# Patient Record
Sex: Female | Born: 1950 | Race: White | Hispanic: Refuse to answer | Marital: Married | Smoking: Never smoker
Health system: Southern US, Community
[De-identification: ages and names within clinical notes are randomized; demographics above are authoritative.]

## PROBLEM LIST (undated history)

## (undated) DIAGNOSIS — D563 Thalassemia minor: Secondary | ICD-10-CM

## (undated) DIAGNOSIS — H269 Unspecified cataract: Secondary | ICD-10-CM

## (undated) DIAGNOSIS — M810 Age-related osteoporosis without current pathological fracture: Secondary | ICD-10-CM

## (undated) DIAGNOSIS — S92909A Unspecified fracture of unspecified foot, initial encounter for closed fracture: Secondary | ICD-10-CM

## (undated) HISTORY — DX: Age-related osteoporosis without current pathological fracture: M81.0

## (undated) HISTORY — DX: Unspecified cataract: H26.9

## (undated) HISTORY — PX: FRACTURE SURGERY: SHX138

## (undated) HISTORY — DX: Thalassemia minor: D56.3

## (undated) HISTORY — DX: Unspecified fracture of unspecified foot, initial encounter for closed fracture: S92.909A

---

## 1997-12-06 ENCOUNTER — Other Ambulatory Visit: Admission: RE | Admit: 1997-12-06 | Discharge: 1997-12-06 | Payer: Self-pay | Admitting: Obstetrics & Gynecology

## 1998-09-23 ENCOUNTER — Other Ambulatory Visit: Admission: RE | Admit: 1998-09-23 | Discharge: 1998-09-23 | Payer: Self-pay | Admitting: Obstetrics and Gynecology

## 1999-08-22 ENCOUNTER — Encounter: Payer: Self-pay | Admitting: Obstetrics and Gynecology

## 1999-08-22 ENCOUNTER — Encounter: Admission: RE | Admit: 1999-08-22 | Discharge: 1999-08-22 | Payer: Self-pay | Admitting: Obstetrics and Gynecology

## 1999-10-23 ENCOUNTER — Other Ambulatory Visit: Admission: RE | Admit: 1999-10-23 | Discharge: 1999-10-23 | Payer: Self-pay | Admitting: Obstetrics and Gynecology

## 2000-01-22 ENCOUNTER — Emergency Department (HOSPITAL_COMMUNITY): Admission: EM | Admit: 2000-01-22 | Discharge: 2000-01-22 | Payer: Self-pay | Admitting: Emergency Medicine

## 2000-08-24 ENCOUNTER — Encounter: Admission: RE | Admit: 2000-08-24 | Discharge: 2000-08-24 | Payer: Self-pay | Admitting: Obstetrics and Gynecology

## 2000-08-24 ENCOUNTER — Encounter: Payer: Self-pay | Admitting: Obstetrics and Gynecology

## 2000-12-28 ENCOUNTER — Other Ambulatory Visit: Admission: RE | Admit: 2000-12-28 | Discharge: 2000-12-28 | Payer: Self-pay | Admitting: Obstetrics and Gynecology

## 2001-10-12 ENCOUNTER — Encounter: Admission: RE | Admit: 2001-10-12 | Discharge: 2001-10-12 | Payer: Self-pay | Admitting: Obstetrics and Gynecology

## 2001-10-12 ENCOUNTER — Encounter: Payer: Self-pay | Admitting: Obstetrics and Gynecology

## 2002-02-27 ENCOUNTER — Other Ambulatory Visit: Admission: RE | Admit: 2002-02-27 | Discharge: 2002-02-27 | Payer: Self-pay | Admitting: Obstetrics and Gynecology

## 2002-10-16 ENCOUNTER — Encounter: Payer: Self-pay | Admitting: Obstetrics and Gynecology

## 2002-10-16 ENCOUNTER — Encounter: Admission: RE | Admit: 2002-10-16 | Discharge: 2002-10-16 | Payer: Self-pay | Admitting: Obstetrics and Gynecology

## 2003-06-23 HISTORY — PX: US ECHOCARDIOGRAPHY: HXRAD669

## 2003-07-02 ENCOUNTER — Other Ambulatory Visit: Admission: RE | Admit: 2003-07-02 | Discharge: 2003-07-02 | Payer: Self-pay | Admitting: Obstetrics and Gynecology

## 2003-10-15 ENCOUNTER — Encounter: Admission: RE | Admit: 2003-10-15 | Discharge: 2003-10-15 | Payer: Self-pay | Admitting: Obstetrics and Gynecology

## 2004-05-12 ENCOUNTER — Ambulatory Visit: Payer: Self-pay | Admitting: Internal Medicine

## 2004-05-27 ENCOUNTER — Ambulatory Visit: Payer: Self-pay | Admitting: Internal Medicine

## 2004-06-02 ENCOUNTER — Encounter: Payer: Self-pay | Admitting: Internal Medicine

## 2004-06-22 HISTORY — PX: COLONOSCOPY: SHX174

## 2004-06-22 LAB — HM COLONOSCOPY: HM Colonoscopy: NORMAL

## 2004-06-24 ENCOUNTER — Ambulatory Visit: Payer: Self-pay | Admitting: Internal Medicine

## 2004-09-23 ENCOUNTER — Ambulatory Visit: Payer: Self-pay | Admitting: Internal Medicine

## 2004-10-23 ENCOUNTER — Encounter: Admission: RE | Admit: 2004-10-23 | Discharge: 2004-10-23 | Payer: Self-pay | Admitting: Obstetrics and Gynecology

## 2004-12-05 ENCOUNTER — Ambulatory Visit: Payer: Self-pay | Admitting: Internal Medicine

## 2005-02-20 ENCOUNTER — Ambulatory Visit: Payer: Self-pay | Admitting: Internal Medicine

## 2005-11-25 ENCOUNTER — Encounter: Admission: RE | Admit: 2005-11-25 | Discharge: 2005-11-25 | Payer: Self-pay | Admitting: Obstetrics and Gynecology

## 2006-08-04 ENCOUNTER — Ambulatory Visit (HOSPITAL_BASED_OUTPATIENT_CLINIC_OR_DEPARTMENT_OTHER): Admission: RE | Admit: 2006-08-04 | Discharge: 2006-08-04 | Payer: Self-pay | Admitting: Obstetrics and Gynecology

## 2006-08-04 ENCOUNTER — Encounter (INDEPENDENT_AMBULATORY_CARE_PROVIDER_SITE_OTHER): Payer: Self-pay | Admitting: Specialist

## 2006-11-19 ENCOUNTER — Ambulatory Visit: Payer: Self-pay | Admitting: Internal Medicine

## 2006-12-01 ENCOUNTER — Encounter: Admission: RE | Admit: 2006-12-01 | Discharge: 2006-12-01 | Payer: Self-pay | Admitting: Obstetrics and Gynecology

## 2007-06-23 HISTORY — PX: OTHER SURGICAL HISTORY: SHX169

## 2007-09-09 ENCOUNTER — Encounter: Payer: Self-pay | Admitting: Internal Medicine

## 2007-09-19 LAB — CONVERTED CEMR LAB
ALT: 14 units/L
Albumin: 4.6 g/dL
BUN: 13 mg/dL
Calcium: 10.1 mg/dL
Cholesterol: 214 mg/dL
HCT: 40 %
LDL Cholesterol: 101 mg/dL
MCHC: 31.3 g/dL
Potassium: 3.3 meq/L
RBC: 6.78 M/uL
RDW: 15.7 %
Total Protein: 7.4 g/dL
Triglycerides: 79 mg/dL

## 2007-09-23 ENCOUNTER — Ambulatory Visit: Payer: Self-pay | Admitting: Internal Medicine

## 2007-09-23 DIAGNOSIS — D568 Other thalassemias: Secondary | ICD-10-CM | POA: Insufficient documentation

## 2007-09-23 DIAGNOSIS — E876 Hypokalemia: Secondary | ICD-10-CM | POA: Insufficient documentation

## 2007-09-23 DIAGNOSIS — R5383 Other fatigue: Secondary | ICD-10-CM

## 2007-09-23 DIAGNOSIS — R5381 Other malaise: Secondary | ICD-10-CM | POA: Insufficient documentation

## 2007-10-12 ENCOUNTER — Encounter: Payer: Self-pay | Admitting: Internal Medicine

## 2007-10-13 ENCOUNTER — Telehealth: Payer: Self-pay | Admitting: *Deleted

## 2008-02-06 ENCOUNTER — Encounter: Admission: RE | Admit: 2008-02-06 | Discharge: 2008-02-06 | Payer: Self-pay | Admitting: Obstetrics and Gynecology

## 2009-02-06 ENCOUNTER — Encounter: Admission: RE | Admit: 2009-02-06 | Discharge: 2009-02-06 | Payer: Self-pay | Admitting: Obstetrics and Gynecology

## 2009-11-11 ENCOUNTER — Telehealth: Payer: Self-pay | Admitting: *Deleted

## 2009-12-25 ENCOUNTER — Ambulatory Visit: Payer: Self-pay | Admitting: Internal Medicine

## 2009-12-25 LAB — CONVERTED CEMR LAB
Bilirubin Urine: NEGATIVE
CO2: 29 meq/L (ref 19–32)
Chloride: 108 meq/L (ref 96–112)
Cholesterol: 219 mg/dL — ABNORMAL HIGH (ref 0–200)
Creatinine, Ser: 0.7 mg/dL (ref 0.4–1.2)
Glucose, Bld: 87 mg/dL (ref 70–99)
HCT: 41.2 % (ref 36.0–46.0)
HDL: 84.4 mg/dL (ref >39.00)
Hemoglobin, Urine: NEGATIVE
Hemoglobin: 13.2 g/dL (ref 12.0–15.0)
Ketones, ur: NEGATIVE mg/dL
Lymphs Abs: 1.5 10*3/uL (ref 0.7–4.0)
MCHC: 32 g/dL (ref 30.0–36.0)
Neutro Abs: 3.4 10*3/uL (ref 1.4–7.7)
Neutrophils Relative %: 63.3 % (ref 43.0–77.0)
Nitrite: NEGATIVE
Platelets: 194 10*3/uL (ref 150.0–400.0)
Potassium: 4.6 meq/L (ref 3.5–5.1)
RBC: 6.33 M/uL — ABNORMAL HIGH (ref 3.87–5.11)
TSH: 1.29 microintl units/mL (ref 0.35–5.50)
Total CHOL/HDL Ratio: 3
Total Protein, Urine: NEGATIVE mg/dL
Triglycerides: 88 mg/dL (ref 0.0–149.0)
Urine Glucose: NEGATIVE mg/dL
Urobilinogen, UA: 0.2 (ref 0.0–1.0)
VLDL: 17.6 mg/dL (ref 0.0–40.0)
WBC: 5.4 10*3/uL (ref 4.5–10.5)
pH: 6 (ref 5.0–8.0)

## 2009-12-31 ENCOUNTER — Ambulatory Visit: Payer: Self-pay | Admitting: Internal Medicine

## 2009-12-31 DIAGNOSIS — R1319 Other dysphagia: Secondary | ICD-10-CM | POA: Insufficient documentation

## 2009-12-31 DIAGNOSIS — R0609 Other forms of dyspnea: Secondary | ICD-10-CM | POA: Insufficient documentation

## 2009-12-31 DIAGNOSIS — R0989 Other specified symptoms and signs involving the circulatory and respiratory systems: Secondary | ICD-10-CM

## 2010-01-21 ENCOUNTER — Encounter: Admission: RE | Admit: 2010-01-21 | Discharge: 2010-01-21 | Payer: Self-pay | Admitting: Internal Medicine

## 2010-02-07 ENCOUNTER — Encounter: Admission: RE | Admit: 2010-02-07 | Discharge: 2010-02-07 | Payer: Self-pay | Admitting: Obstetrics and Gynecology

## 2010-06-22 HISTORY — PX: COLONOSCOPY: SHX174

## 2010-07-24 NOTE — Progress Notes (Signed)
Summary: bat in house  Phone Note Call from Patient Call back at [facility] Phone [phone] Call back at [phone] (cell)   Caller: Patient Summary of Call: Pt had a Bat in her house. It was located between her window and the storm window. Animal control told them to call us to notify us. Pt is unsure of anyone getting bit. Animal control said that they could have gotten bit but not know it.  Initial call taken by: Romualdo Bolk, CMA Duncan Dull),  Nov 11, 2009 1:01 PM  Follow-up for Phone Call        It seems highly unlikely that anyone was bitten. As long as no one handled the bat, they should be fine  Follow-up by: Nelwyn Salisbury MD,  Nov 11, 2009 1:25 PM  Additional Follow-up for Phone Call Additional follow up Details #1::        Pt aware. Additional Follow-up by: Romualdo Bolk, CMA (AAMA),  Nov 11, 2009 1:38 PM

## 2010-07-24 NOTE — Assessment & Plan Note (Signed)
Summary: cpx-no pap//ccm   Vital Signs:  Patient profile:   60 year old female Menstrual status:  postmenopausal Height:      66.5 inches Weight:      148 pounds BMI:     23.62 Pulse rate:   84 / minute BP sitting:   120 / 80  (right arm) Cuff size:   regular  Vitals Entered By: Romualdo Bolk, CMA (AAMA) (December 31, 2009 9:25 AM) CC: CPX- no pap- Pt has a gyn who does paps     Menstrual Status postmenopausal Last PAP Result normal   History of Present Illness: Heather Goodwin  comes in today  for preventive visit . She is on no meds and  seens gyne for paps . She is generally well on no rx medications , Hasnt had a check up in a number of years.  Some concerns about some decrease in exer cise tolerance but exercises regualary without cp sob However has had some feeling of hard to get a deep breath when not exercising . Is not sure  its serious  or not. NO cough diaphoresis or weight change.   Also feel transiently that something is pressing on her strenal tracheal area at time but no true dysphagia no drooling and this is  episodic  Alos her mom died of pancreatic cancer and has ? about this ? if screening is a good idea.   Preventive Care Screening  Pap Smear:    Date:  12/20/2008    Results:  normal   Mammogram:    Date:  10/20/2008    Results:  normal   Last Tetanus Booster:    Date:  06/23/2003    Results:  Historical   Prior Values:    Colonoscopy:  Normal;  ? date  (06/22/2004)    Bone Density:  Normal (09/19/2007)    Dexa Interp:  Normal (09/19/2007)    T-score L-Spine:  -0.6 (09/19/2007)    T-score L hip:  -0.2 (09/19/2007)   Preventive Screening-Counseling & Management  Alcohol-Tobacco     Alcohol drinks/day: <1     Alcohol type: wine     Smoking Status: never  Caffeine-Diet-Exercise     Caffeine use/day: 2-3     Does Patient Exercise: yes  Hep-HIV-STD-Contraception     Dental Visit-last 6 months yes     Sun Exposure-Excessive:  no  Safety-Violence-Falls     Seat Belt Use: yes     Firearms in the Home: firearms in the home     Firearm Counseling: not indicated; uses recommended firearm safety measures     Smoke Detectors: yes      Blood Transfusions:  no.    Current Medications (verified): 1)  None  Allergies (verified): No Known Drug Allergies  Past History:  Past medical, surgical, family and social histories (including risk factors) reviewed, and no changes noted (except as noted below).  Past Medical History: b thal trait childbirth x3  foot fracture  at gym Dexa  nl  per gyne      Past Surgical History:  left knee surgery  meniscus  2009 Neg echo for syncope 2005  Past History:  Care Management: Gynecology: Rosalio Macadamia    Family History: Reviewed history from 09/23/2007 and no changes required. Family History Thyroid disease-Mom with partial thyroidectomy; six siblings Family History Liver disease- Father died of cirrhosis of the liver secondary to chronic hepatitis B infection. Family History Breast cancer 1st degree relative <50-Paternal Aunt had breast cancer. Mom died  of pancreatic cancer Son with B thal trait  Social History: Reviewed history from 09/23/2007 and no changes required. Married Never Smoked Alcohol use-yes Regular exercise-yes Caffeine use/day:  2-3 Dental Care w/in 6 mos.:  yes Sun Exposure-Excessive:  no Seat Belt Use:  yes Blood Transfusions:  no  Review of Systems  The patient denies anorexia, fever, vision loss, decreased hearing, hoarseness, syncope, peripheral edema, prolonged cough, headaches, hemoptysis, abdominal pain, melena, hematochezia, severe indigestion/heartburn, hematuria, muscle weakness, transient blindness, difficulty walking, depression, abnormal bleeding, enlarged lymph nodes, angioedema, and breast masses.   Physical Exam General Appearance: well developed, well nourished, no acute distress Eyes: conjunctiva and lids normal, PERRLA,  EOMI, fWNL Ears, Nose, Mouth, Throat: TM clear, nares clear, oral exam WNL Neck: supple, no lymphadenopathy, no thyromegaly, no JVD  describes area of discomfort at suprasternal notch  Respiratory: clear to auscultation and percussion, respiratory effort normal Cardiovascular: regular rate and rhythm, S1-S2, no murmur, rub or gallop, no bruits, peripheral pulses normal and symmetric, no cyanosis, clubbing, edema or varicosities Chest: no scars, masses, tenderness; no asymmetry, skin changes, nipple discharge   Gastrointestinal: soft, non-tender; no hepatosplenomegaly, masses; active bowel sounds all quadrants,  Genitourinary: per gyne Lymphatic: no cervical, axillary or inguinal adenopathy Musculoskeletal: gait normal, muscle tone and strength WNL, no joint swelling, effusions, discoloration, crepitus  Skin: clear, good turgor, color WNL, no rashes, lesions, or ulcerations Neurologic: normal mental status, normal reflexes, normal strength, sensation, and motion Psychiatric: alert; oriented to person, place and time Other Exam:  EKG NSR  70 nnl intervals   labs nl except wbc in urine without .signs of uti    and microcytosis( c/w beta thal )     Impression & Recommendations:  Problem # 1:  PREVENTIVE HEALTH CARE (ICD-V70.0)  continue healthy lifestyle   UTD   in parameters  Orders: EKG w/ Interpretation (93000)  Problem # 2:  DYSPNEA/SHORTNESS OF BREATH (ICD-786.09)  this seems not alarming but newer  . she is fit but has noted some change   noevicne of heart dysfunction . consider getting pulm evlauation first. or CVpulm assessment  Orders: Pulmonary Referral (Pulmonary) EKG w/ Interpretation (93000)  Problem # 3:  OTHER DYSPHAGIA (ICD-787.29)  near lower trachea     seems nl exam although could have  low lying thyroid     because of the other signs will check neck US  consider ent check  if persistent or  progressive    Orders: Radiology Referral (Radiology)  Problem # 4:   NEOPLASM, MALIGNANT, PANCREAS, FAMILY HX (ICD-V16.0) no signs of such  asks ? about advisability of any screening   .  disc  no standard  like ct mri markers etc.  rec unless  high risk   .  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  check about pancreatic cancer screening.

## 2010-11-07 NOTE — Op Note (Signed)
NAMEKENNICE, FINNIE              ACCOUNT NO.:  [id number]   MEDICAL RECORD NO.:  [id number]          PATIENT TYPE:  AMB   LOCATION:  [facility]                         FACILITY:  [facility]   PHYSICIAN:  Sherry A. Dickstein, M.D.DATE OF BIRTH:  22-Jan-1951   DATE OF PROCEDURE:  08/04/2006  DATE OF DISCHARGE:                               OPERATIVE REPORT   PREOPERATIVE DIAGNOSIS:  Menorrhagia, submucosal fibroids.   POSTOPERATIVE DIAGNOSIS:  Menorrhagia, submucosal fibroids.   OPERATION PERFORMED:  Dilation and curettage hysterectomy with attempted  endometrial ablation with resectoscope.   SURGEON:  Sherry A. Rosalio Macadamia, M.D.   ANESTHESIA:  MAC.   INDICATIONS FOR PROCEDURE:  This is a 60 year old woman who has had  increasing menorrhagia over the past two years.  The patient has been  treated with progesterone which has not controlled her bleeding.  Patient underwent an ultrasound which revealed a fibroid uterus.  She  then had sonohysterogram which revealed a submucosal fibroid.  However,  less than 50% of the fibroid was felt to be in the endometrial cavity.  Therefore, a decision was made to perform an endometrial ablation rather  than a D&C with a resectoscope.  The patient was then prepared for  endometrial ablation.   FINDINGS:  Normal-sized anteflexed uterus.  No adnexal mass.  Endometrial cavity with two submucosal fibroids present with significant  amount of the fibroid protruding into the endometrial cavity.   DESCRIPTION OF PROCEDURE:  The patient was brought into the operating  room and given adequate IV sedation.  She was placed in dorsal lithotomy  position.  Her perineum was washed with Betadine, vagina was washed with  Betadine, bladder was in and out catheterized.  The patient was draped  in sterile fashion.  Speculum was placed within the vagina.  A  paracervical block was administered with 1% nesacaine.  The cervix was  grasped with a single toothed tenaculum.   Cervix was dilated with Pratt  dilator to approximately #21.  The endometrial ablation HDA system was  set up and the hysteroscope was first attempted to be introduced into  the endometrial cavity; however, the cervix was too tight.  Therefore,  the cervix was redilated to approximately a 23.  Then the hysteroscope  was able to be introduced into the endometrial cavity.  Pictures were  obtained.  The system was then started.  A sponge was placed within the  vagina to be able to catch any leaking and to watch for any absorption  of water.  The system was started and initially there was no leaking.  Once the heat system had been started, during the initial heating  process, it was shown that there was some leaking of approximately 9 mL  per minute.  The system automatically shut down.  After a short wait  period, the heating process was restarted.  As the heating process was  restarted, it was shown that there was again some expansion of fluid.  It was felt that this was not safe to continue in case there was any  leaking.  There was no leaking seen within the vagina.  The sponge that  was in the vagina at this point was completely dry.  It was felt that  the uterus may be expanding from the heating process, but it also could  be that the fluid was going out the fallopian tubes.  If this were true  and the heat cycle went up to the full complete heat mode, this could be  dangerous to the bowel contents, therefore, the procedure was stopped.  Since the fibroids that were seen were felt to be significant enough in  the endometrial cavity, it was felt that a resectoscope could be  performed.   The hysteroscope from the HDA system was removed and the resectoscope  was prepared and then was introduced into the endometrial cavity after  dilating the cervix up to a #33.  Using a double loop right angle  resector, the two fibroids that were present were resected and were  attempted to be resected  just to the level of the remaining endometrial  surface.  This was resected in sheets and the sheets of tissue were  removed.  Once this was accomplished, pictures were obtained, no further  resections were obtained.  All instruments were removed from the vagina.  In the endometrial cavity, adequate hemostasis was present.  All  instruments were removed.  The patient was taken out of the dorsal  lithotomy position.  She was awakened.  She was moved from the operating  table to a stretcher in stable condition.   COMPLICATIONS:  None.   ESTIMATED BLOOD LOSS:  Less than 5 mL.   SORBITOL DIFFERENTIAL:  -100 mL.      Sherry A. Rosalio Macadamia, M.D.  Electronically Signed     SAD/MEDQ  D:  08/04/2006  T:  08/04/2006  Job:  [id number]

## 2010-11-07 NOTE — Op Note (Signed)
Heather Goodwin, Heather Goodwin              ACCOUNT NO.:  [id number]   MEDICAL RECORD NO.:  [id number]          PATIENT TYPE:  AMB   LOCATION:  [facility]                         [facility]ACILITY:  [facility]   PHYSICIAN:  Sherry A. Dickstein, M.D.DATE OF BIRTH:  03/22/1951   DATE OF PROCEDURE:  DATE OF DISCHARGE:                               OPERATIVE REPORT   Audio too short to transcribe (less than 5 seconds)      Sherry A. Rosalio Macadamia, M.D.     SAD/MEDQ  D:  08/04/2006  T:  08/04/2006  Job:  [id number]

## 2011-01-30 IMAGING — US US SOFT TISSUE HEAD/NECK
1 series · 14 of 25 positions shown · non-contrast
Comparison: None.

CLINICAL DATA: Dysphagia

THYROID ULTRASOUND
TECHNIQUE: Ultrasound examination of the thyroid gland and adjacent
soft tissues was performed.

[Series 1: us soft tissue head/neck · 0.06mm/px · 14 of 36 slices shown]
[im 1/36]
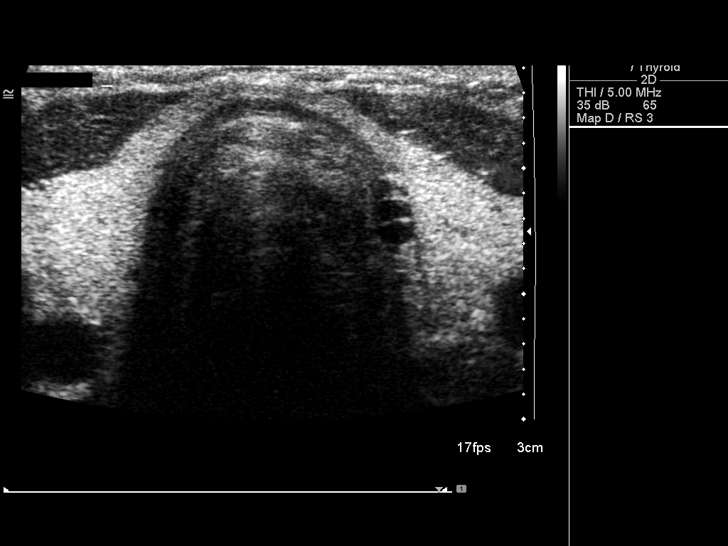
[im 3/36]
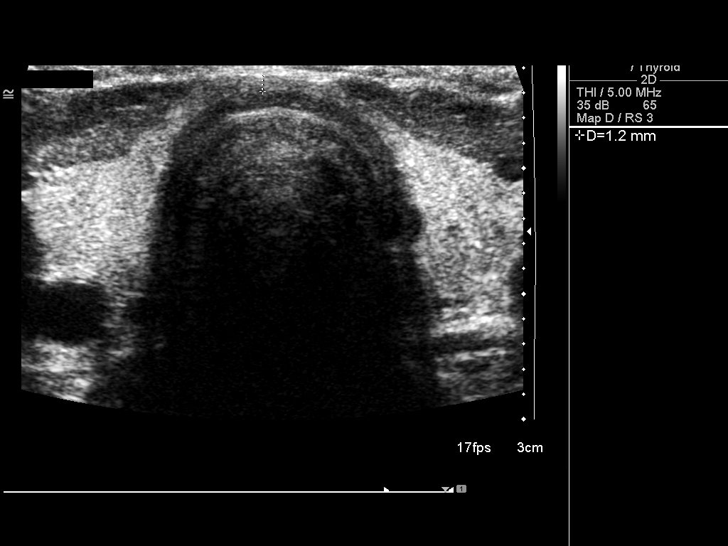
[im 6/36]
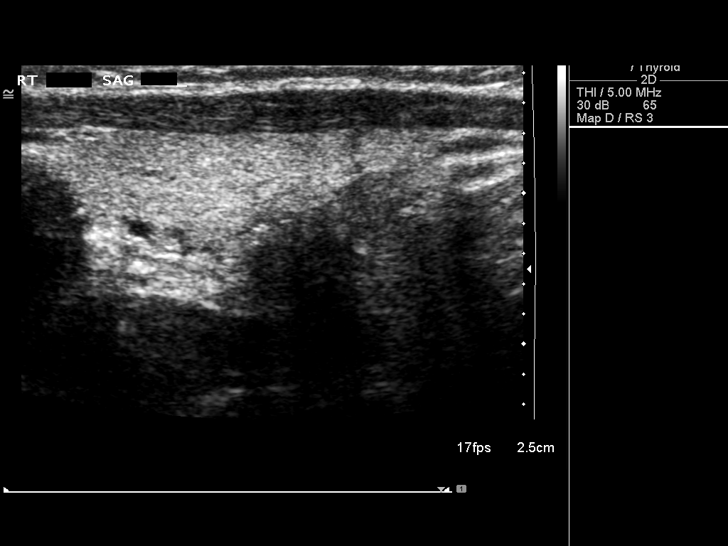
[im 9/36]
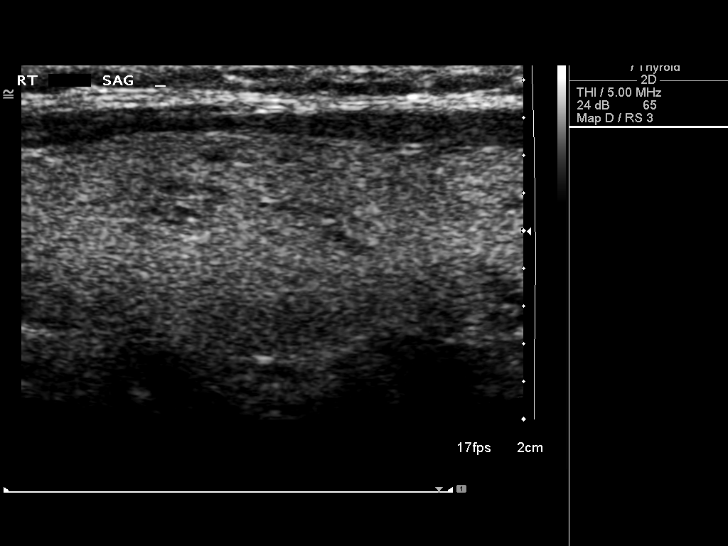
[im 12/36]
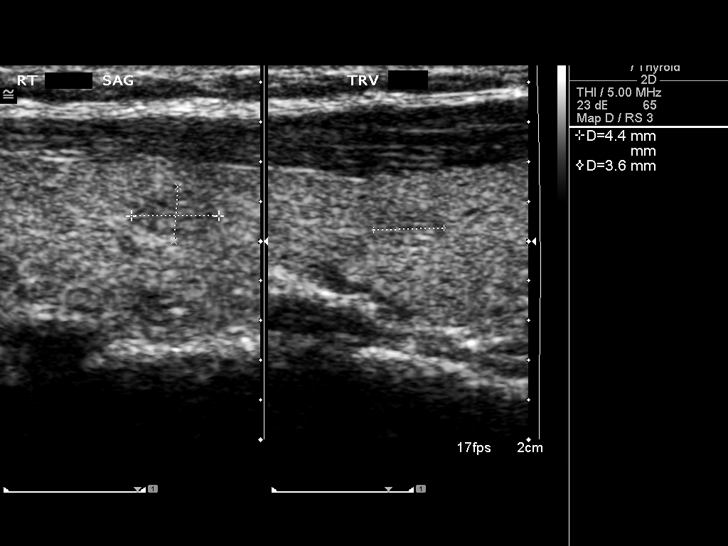
[im 14/36]
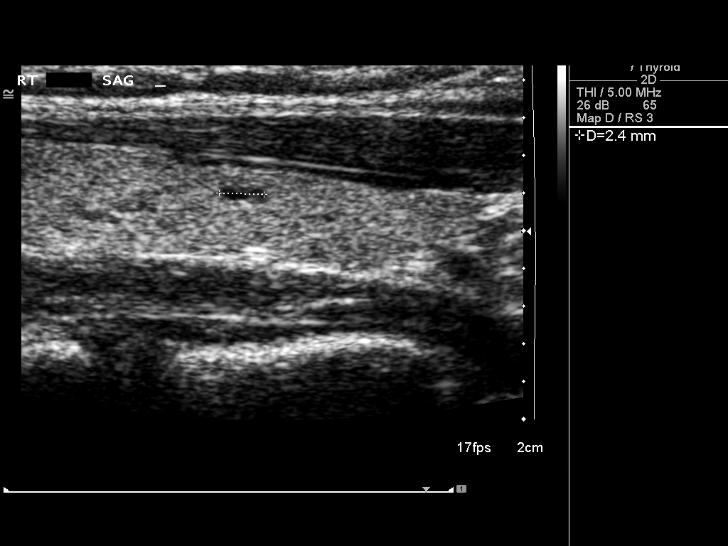
[im 17/36]
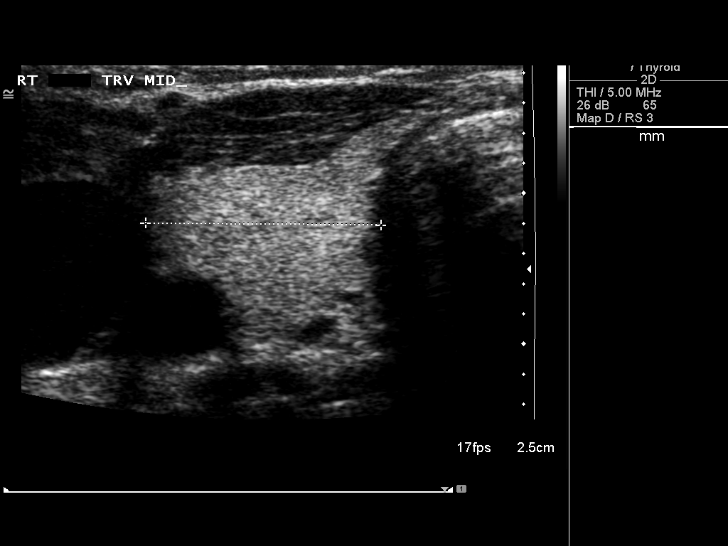
[im 19/36]
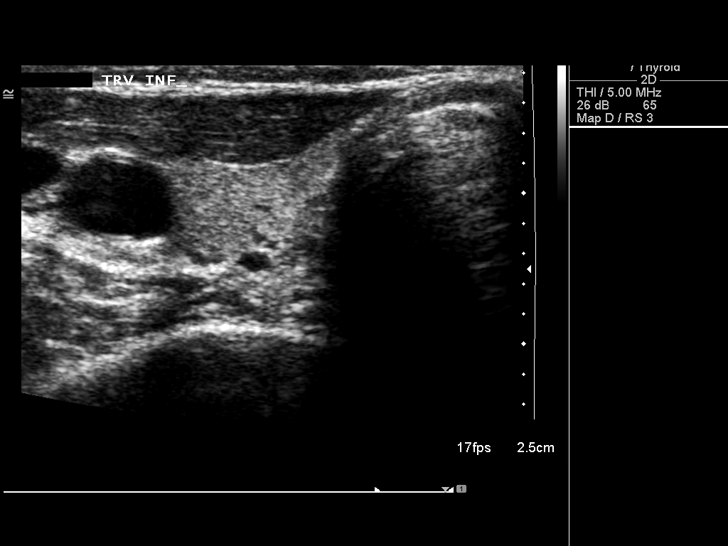
[im 22/36]
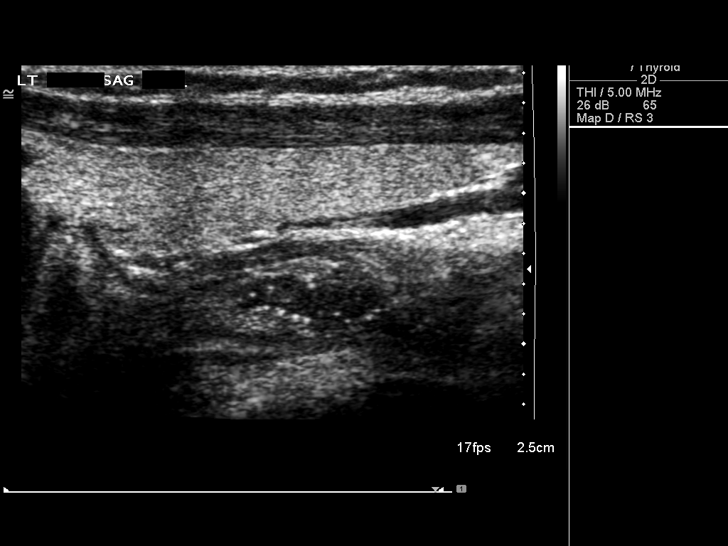
[im 24/36]
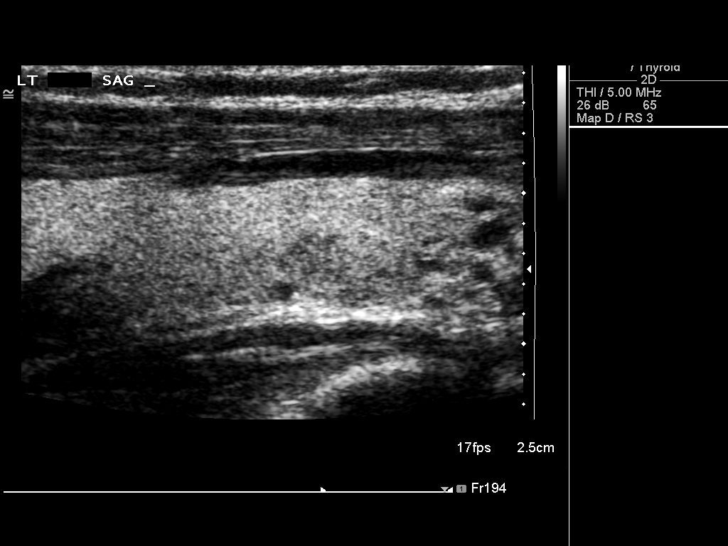
[im 27/36]
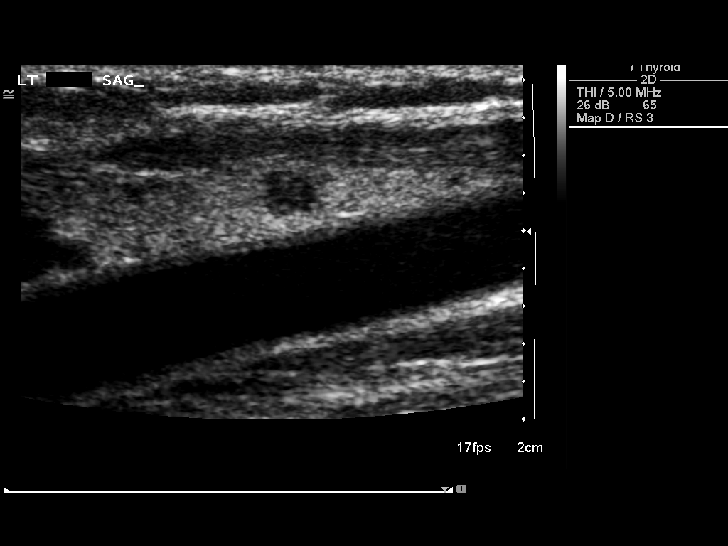
[im 30/36]
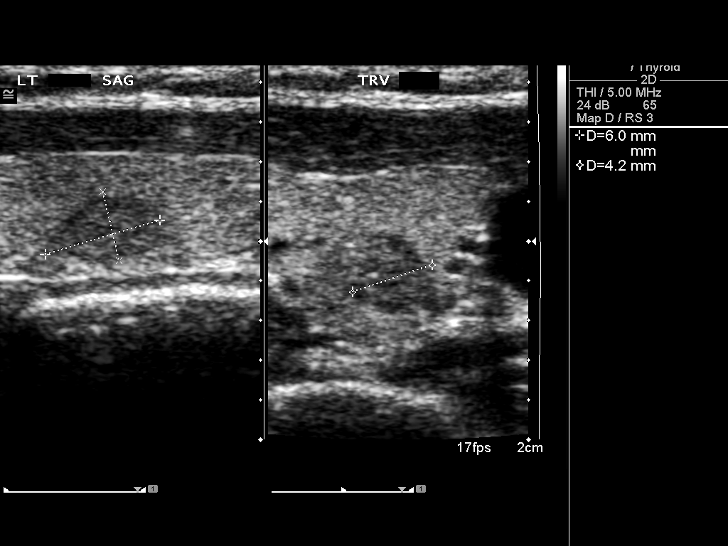
[im 33/36]
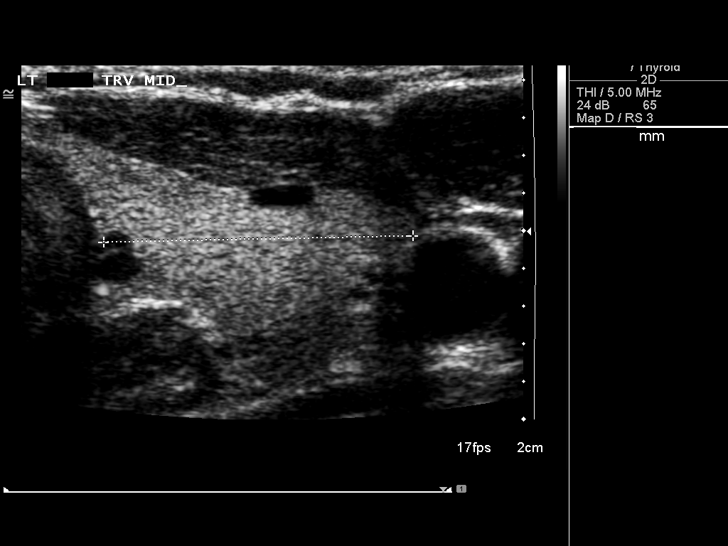
[im 36/36]
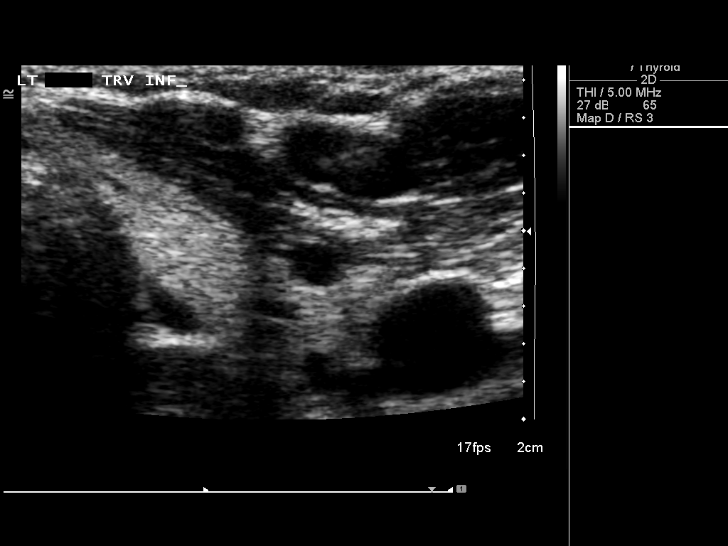

[14 of 25 positions shown; findings below may reference images not displayed]

FINDINGS: Right thyroid lobe:  4.2 x 1.3 x 1.6 cm.
Left thyroid lobe:  4.7 x 0.8 x 1.6 cm.
Isthmus:  1 mm

Focal nodules:  The thyroid gland is relatively homogeneous in
echogenicity.  Multiple nodules are present bilaterally, which are
solid.  None of these nodules measures larger than 7 mm in
diameter.

Lymphadenopathy:  Absent
IMPRESSION: The thyroid gland is normal in size with multiple small solid
nodules of no more than 7 mm in diameter bilaterally.

## 2011-02-10 ENCOUNTER — Other Ambulatory Visit: Payer: Self-pay | Admitting: Obstetrics and Gynecology

## 2011-02-10 DIAGNOSIS — Z1231 Encounter for screening mammogram for malignant neoplasm of breast: Secondary | ICD-10-CM

## 2011-03-02 ENCOUNTER — Ambulatory Visit (INDEPENDENT_AMBULATORY_CARE_PROVIDER_SITE_OTHER): Payer: Self-pay | Admitting: Internal Medicine

## 2011-03-02 ENCOUNTER — Telehealth: Payer: Self-pay | Admitting: Internal Medicine

## 2011-03-02 ENCOUNTER — Encounter: Payer: Self-pay | Admitting: Internal Medicine

## 2011-03-02 VITALS — BP 120/80 | HR 60 | Temp 98.6°F | Wt 148.0 lb

## 2011-03-02 DIAGNOSIS — R152 Fecal urgency: Secondary | ICD-10-CM

## 2011-03-02 DIAGNOSIS — R197 Diarrhea, unspecified: Secondary | ICD-10-CM | POA: Insufficient documentation

## 2011-03-02 DIAGNOSIS — R5383 Other fatigue: Secondary | ICD-10-CM

## 2011-03-02 DIAGNOSIS — R5381 Other malaise: Secondary | ICD-10-CM

## 2011-03-02 DIAGNOSIS — Z800 Family history of malignant neoplasm of digestive organs: Secondary | ICD-10-CM | POA: Insufficient documentation

## 2011-03-02 LAB — LIPID PANEL
HDL: 85.6 mg/dL (ref >39.00)
Total CHOL/HDL Ratio: 3
Triglycerides: 83 mg/dL (ref 0.0–149.0)

## 2011-03-02 LAB — CBC WITH DIFFERENTIAL/PLATELET
Basophils Absolute: 0 10*3/uL (ref 0.0–0.1)
Basophils Relative: 0.4 % (ref 0.0–3.0)
Lymphs Abs: 1.3 10*3/uL (ref 0.7–4.0)
MCHC: 31.1 g/dL (ref 30.0–36.0)
MCV: 64.3 fl — ABNORMAL LOW (ref 78.0–100.0)
Monocytes Absolute: 0.4 10*3/uL (ref 0.1–1.0)
Monocytes Relative: 6.6 % (ref 3.0–12.0)
RDW: 15.2 % — ABNORMAL HIGH (ref 11.5–14.6)

## 2011-03-02 LAB — TSH: TSH: 1.16 u[IU]/mL (ref 0.35–5.50)

## 2011-03-02 LAB — HEPATIC FUNCTION PANEL
ALT: 17 U/L (ref 0–35)
AST: 18 U/L (ref 0–37)
Albumin: 4.6 g/dL (ref 3.5–5.2)
Alkaline Phosphatase: 49 U/L (ref 39–117)
Total Bilirubin: 0.8 mg/dL (ref 0.3–1.2)

## 2011-03-02 LAB — BASIC METABOLIC PANEL
BUN: 19 mg/dL (ref 6–23)
CO2: 27 mEq/L (ref 19–32)
Calcium: 10.1 mg/dL (ref 8.4–10.5)
Creatinine, Ser: 0.7 mg/dL (ref 0.4–1.2)
GFR: 94.48 mL/min (ref >60.00)
Glucose, Bld: 82 mg/dL (ref 70–99)
Potassium: 4.5 mEq/L (ref 3.5–5.1)

## 2011-03-02 NOTE — Telephone Encounter (Signed)
Spoke with Aurther Loft and scheduled patient on 03/04/11 at 2:00 PM with Mike Gip, PA.

## 2011-03-02 NOTE — Patient Instructions (Signed)
Will arrange for  Gi consult about this. Will notify you  of labs when available.

## 2011-03-02 NOTE — Progress Notes (Signed)
  Subjective:    Patient ID: Heather Goodwin, female    DOB: [birth year], 60 y.o.   MRN: [id number]  HPI Patient comes in for a acute visit evaluation. THis morning she had another episode of incontrollable   Diarrhea watery brown hard to get to the bathroom. And  Even had an accident. Watery.  Stools have been normal otherwise. In between. No new foods eating out diuretics caffeine excess alcohol. She did have an episode of that also when she was on vacation and couldn't get to the bathroom and actually had an accident. Ocass bowel urgency when running. No associated abdominal pain fevers sweats flushes unusual rashes nausea vomiting or severe pain.  She also had an incontinence episode after taking NyQuil  at night one time No recent unusual travel other illnesses associated. More  Tired for the past 6 months. Otherwise no unusual change in her health Had colonsocopy  10 year recall. Her mother had pancreatic cancer and had some similar symptoms at times.   Review of Systems Negative for chest pain shortness of breath weight loss see above occasionally gets some mid back pain no history of irritable  bowel   Past history family history social history reviewed in the electronic medical record.     Objective:   Physical Exam Physical Exam: Vital signs reviewed WUJ:WJXB is a well-developed well-nourished alert cooperative  white female who appears her stated age in no acute distress.  HEENT: normocephalic  traumatic , Eyes: PERRL EOM's full, conjunctiva clear, Nares: paten,t no deformity discharge or tenderness., Ears: no deformity  Mouth: clear OP, no lesions, edema.  Moist mucous membranes. Dentition in adequate repair. NECK: supple without masses, thyromegaly or bruits. CHEST/PULM:  Clear to auscultation and percussion breath sounds equal no wheeze , rales or rhonchi. No chest wall deformities or tenderness. CV: PMI is nondisplaced, S1 S2 no gallops, murmurs, rubs. Peripheral pulses are  full without delay.No JVD .  ABDOMEN: Bowel sounds normal  somewhat increased nontender  No guard or rebound, no hepato splenomegal no CVA tenderness.   Extremtities:  No clubbing cyanosis or edema, no acute joint swelling or redness no focal atrophy NEURO:  Oriented x3, cranial nerves 3-12 appear to be intact, no obvious focal weakness,gait within normal limits  SKIN: No acute rashes normal turgor, color, no bruising or petechiae. PSYCH: Oriented, good eye contact, no obvious depression anxiety, cognition and judgment appear normal.     Assessment & Plan:  Episodic fecal urgency( incontinence )diarrhea. Without associated illness or triggers.  This appears to be an episodic problem that has occurred over the last 5-6 months. She has some general malaise or fatigue but otherwise nothing specific on her exam or history.  She has some concerns because of her mothers history of pancreatic cancer.   We'll get some screening labs today and arrange for Dr. Regino Schultze opinion.

## 2011-03-02 NOTE — Telephone Encounter (Signed)
Left a message for Heather Goodwin to call me.

## 2011-03-03 ENCOUNTER — Other Ambulatory Visit: Payer: Self-pay | Admitting: Obstetrics & Gynecology

## 2011-03-03 ENCOUNTER — Ambulatory Visit
Admission: RE | Admit: 2011-03-03 | Discharge: 2011-03-03 | Disposition: A | Discharge status: Home / Self Care | Payer: PRIVATE HEALTH INSURANCE | Source: Ambulatory Visit | Attending: Obstetrics and Gynecology | Admitting: Obstetrics and Gynecology

## 2011-03-03 DIAGNOSIS — Z1231 Encounter for screening mammogram for malignant neoplasm of breast: Secondary | ICD-10-CM

## 2011-03-03 LAB — LDL CHOLESTEROL, DIRECT: Direct LDL: 123.1 mg/dL

## 2011-03-04 ENCOUNTER — Ambulatory Visit (INDEPENDENT_AMBULATORY_CARE_PROVIDER_SITE_OTHER): Payer: PRIVATE HEALTH INSURANCE | Admitting: Physician Assistant

## 2011-03-04 ENCOUNTER — Encounter: Payer: Self-pay | Admitting: Physician Assistant

## 2011-03-04 VITALS — BP 112/64 | HR 80 | Ht 67.0 in | Wt 148.0 lb

## 2011-03-04 DIAGNOSIS — R159 Full incontinence of feces: Secondary | ICD-10-CM

## 2011-03-04 MED ORDER — PEG-KCL-NACL-NASULF-NA ASC-C 100 G PO SOLR: ORAL | Status: DC

## 2011-03-04 NOTE — Patient Instructions (Signed)
We have scheduled the colonoscopy with Dr. Lina Sar on 03-10-2011. Directions and brochure provided. We sent the prescription for the prep you will be drinking to the [facility] [employer].

## 2011-03-04 NOTE — Progress Notes (Signed)
Reviewed and agree with management. Heather Goodwin T. Lexus Shampine MD FACG 

## 2011-03-04 NOTE — Progress Notes (Signed)
  Subjective:    Patient ID: Heather Goodwin, female    DOB: 07-08-1950, 60 y.o.   MRN: [id number]  HPI Halston is a 60 year old female known to Dr. Juanda Chance  from prior screening colonoscopy. She had colonoscopy in 2006 which was a normal exam. 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 She is referred today for evaluation of recent episodes of fecal incontinence. She states she had one episode in April which occurred at night time while she was asleep however she had had a few drinks that evening and had also taken NyQuil before going to bed. She had a second episode while on vacation this summer and had eaten a large salad for lunch with incontinence of loose stool shortly thereafter. She had another episode earlier this week with incontinence of loose stool with no trigger that she is aware of. She says she does have urgency for bowel movement but with these episodes has not had enough warning to get to the bathroom. In between these episodes she's been having normal bowel movements, with an occasional loose stool. She has not noted any melena or magnesium. She denies any abdominal pain or changes in her bowel habits otherwise. She has no difficulty with stress urinary incontinence.  Patient relates that her mother had pancreatic cancer and she remembers that she had had an episode of fecal incontinence a year or so prior to her diagnosis of pancreatic cancer.    Review of Systems  Constitutional: Negative.   Eyes: Negative.   Respiratory: Negative.   Cardiovascular: Negative.   Genitourinary: Negative.   Musculoskeletal: Negative.   Skin: Negative.   Neurological: Negative.   Hematological: Negative.   Psychiatric/Behavioral: Negative.       Meds; patient on no regular medications   Allergies; no known drug allergies Objective:   Physical Exam Well-developed white female in no acute distress, pleasant, alert and oriented x3 HEENT; nontraumatic normocephalic  EOMI PERRLA sclera anicteric,Neck; Supple no JVD, Cardiovascular; regular rate and rhythm with S1-S2 no murmur rub or gallop, Pulmonary; clear bilaterally, Abdomen; soft nontender nondistended no palpable mass or hepatosplenomegaly no guarding bowel sounds active rectal, soft brown stool Hemoccult negative, no palpable mass no impaction, she does have decreased external anal sphincter tone. Skin; benign warm and dry, Psych; mood and affect normal an appropriate        Assessment & Plan:  #53 60 year old female with 3 episodes of fecal incontinence over the past year, 2 episodes associated with diarrhea. I suspect this is secondary to  sphincter laxity, with pelvic floor dysfunction. Her last colonoscopy was 6 years ago and therefore need to rule out other process, neoplasm etc.  Plan; Schedule for colonoscopy with Dr. Lina Sar, procedure discussed in detail with the patient Patient has GYN appointments scheduled for this coming week and encouraged her to discuss this with her gynecologist as well, though I do not feel any obvious rectocele on exam Discussed adding a fiber supplement which she does not want to do at this point, also discussed trial of a once daily antispasmodic in attempt to avoid further episodes but she is reluctant to start on any medication at this time.

## 2011-03-10 ENCOUNTER — Ambulatory Visit (AMBULATORY_SURGERY_CENTER): Payer: PRIVATE HEALTH INSURANCE | Admitting: Internal Medicine

## 2011-03-10 ENCOUNTER — Encounter: Payer: Self-pay | Admitting: *Deleted

## 2011-03-10 ENCOUNTER — Encounter: Payer: Self-pay | Admitting: Internal Medicine

## 2011-03-10 VITALS — BP 112/75 | HR 66 | Temp 97.8°F | Resp 18 | Ht 67.0 in | Wt 148.0 lb

## 2011-03-10 DIAGNOSIS — D1330 Benign neoplasm of unspecified part of small intestine: Secondary | ICD-10-CM

## 2011-03-10 DIAGNOSIS — D128 Benign neoplasm of rectum: Secondary | ICD-10-CM

## 2011-03-10 DIAGNOSIS — D129 Benign neoplasm of anus and anal canal: Secondary | ICD-10-CM

## 2011-03-10 DIAGNOSIS — R159 Full incontinence of feces: Secondary | ICD-10-CM

## 2011-03-10 DIAGNOSIS — Z1211 Encounter for screening for malignant neoplasm of colon: Secondary | ICD-10-CM

## 2011-03-10 MED ORDER — SODIUM CHLORIDE 0.9 % IV SOLN: 500.0000 mL | INTRAVENOUS | Status: DC

## 2011-03-10 NOTE — Progress Notes (Signed)
DUE TO [employer] DOWNTIME, ALL CHARTING HAS BEEN COMPLETED BY Jerelyn Trimarco  UNLESS CHARTED OTHERWISE. 

## 2011-03-11 ENCOUNTER — Telehealth: Payer: Self-pay

## 2011-03-11 ENCOUNTER — Telehealth: Payer: Self-pay | Admitting: *Deleted

## 2011-03-11 NOTE — Telephone Encounter (Signed)
Her incontinence is due to sudden colon spasm regardless whether she  Perceives it as cramps. Bentyl relaxes the smooth muscles of the colon and helps the "urgency". She ought to try it as needed. I will be happy to discuss in the office.

## 2011-03-11 NOTE — Telephone Encounter (Signed)
No ID on answering machine. 

## 2011-03-11 NOTE — Telephone Encounter (Signed)
Spoke with patient and gave her Dr. Regino Schultze answer and recommendations.

## 2011-03-11 NOTE — Telephone Encounter (Signed)
Patient calling about the prescription she received yesterday after her colonoscopy for Bentyl. She wants to know why she needs this since she does not have cramping. States she had incontinence of stool 3 times without cramping. Also, wants information of high fiber diet. Information sent to patient on this. Please, advise.

## 2011-03-17 ENCOUNTER — Encounter: Payer: Self-pay | Admitting: Internal Medicine

## 2012-02-19 ENCOUNTER — Other Ambulatory Visit: Payer: Self-pay | Admitting: Obstetrics & Gynecology

## 2012-02-19 DIAGNOSIS — Z1231 Encounter for screening mammogram for malignant neoplasm of breast: Secondary | ICD-10-CM

## 2012-03-10 ENCOUNTER — Ambulatory Visit
Admission: RE | Admit: 2012-03-10 | Discharge: 2012-03-10 | Disposition: A | Discharge status: Home / Self Care | Payer: PRIVATE HEALTH INSURANCE | Source: Ambulatory Visit | Attending: Obstetrics & Gynecology | Admitting: Obstetrics & Gynecology

## 2012-03-10 DIAGNOSIS — Z1231 Encounter for screening mammogram for malignant neoplasm of breast: Secondary | ICD-10-CM

## 2013-02-14 ENCOUNTER — Other Ambulatory Visit: Payer: Self-pay

## 2013-02-14 DIAGNOSIS — Z1231 Encounter for screening mammogram for malignant neoplasm of breast: Secondary | ICD-10-CM

## 2013-03-02 ENCOUNTER — Telehealth: Payer: Self-pay | Admitting: Internal Medicine

## 2013-03-02 NOTE — Telephone Encounter (Signed)
Pt instructed to call in for a rx of Ambien 10 mg and phenergan for nausea. Pt will be traveling for 12 days and only needs for trip to [place]. Pharm: [facility]/ [address]

## 2013-03-03 NOTE — Telephone Encounter (Signed)
Patient not seen in 2 years by [facility]  These drugs have not been prescribed in the past.  Please make a 30 minute appt with the pt to see WP.  Thanks!!

## 2013-03-03 NOTE — Telephone Encounter (Signed)
No I cannot do this. She hasn't even seen Dr. Fabian Sharp for 2 years. She would need to see her to discuss this

## 2013-03-03 NOTE — Telephone Encounter (Signed)
Left message at home number for the pt to return my call. 

## 2013-03-03 NOTE — Telephone Encounter (Signed)
I do not see where this medication has been prescribed by [facility] in the past. Please advise.  Thanks!!

## 2013-03-06 NOTE — Telephone Encounter (Signed)
lmom for pt to call office

## 2013-03-13 ENCOUNTER — Ambulatory Visit: Payer: PRIVATE HEALTH INSURANCE

## 2013-03-14 NOTE — Telephone Encounter (Signed)
lmom for pt to call office

## 2013-03-14 NOTE — Telephone Encounter (Signed)
Pt states that she is a friend of Dr. Fabian Sharp, and upon speaking with her recently, Dr. Fabian Sharp told her that she would not need to make an appointment in order to obtain these medications. Pt stated that she is aware that Dr. Fabian Sharp is on vacation this week, but that Dr. Fabian Sharp knows that she is getting ready to travel as well and will need this medication in order to do so. Please assist.

## 2013-03-16 NOTE — Telephone Encounter (Signed)
I have treated this patient in past . And am aware of her good health and medical status.  Please send in   phenergan 25 mg 1 po  q 4-6 hours as needed for nausea and vomiting .  Disp 15 # no refills    Ambien 10 mg  Take 1/2 tp 1 po as needed for sleep disp # 14 .  No refills   Thanks  [facility]

## 2013-03-17 ENCOUNTER — Other Ambulatory Visit: Payer: Self-pay | Admitting: Family Medicine

## 2013-03-17 MED ORDER — ZOLPIDEM TARTRATE 10 MG PO TABS: ORAL_TABLET | ORAL | Status: DC

## 2013-03-17 MED ORDER — PROMETHAZINE HCL 25 MG PO TABS: ORAL_TABLET | ORAL | Status: DC

## 2013-03-17 NOTE — Telephone Encounter (Signed)
Patient notified to pick up at the pharmacy. 

## 2013-03-22 ENCOUNTER — Telehealth: Payer: Self-pay | Admitting: Internal Medicine

## 2013-03-22 NOTE — Telephone Encounter (Signed)
Heather Goodwin from [facility] called and reported that this patients medications where called in under the wrong patient. They where called in under [name], which is the patients name, but where called in under an incorrect date of birth. Instead of the patients DOB [birth year], they where called in under [birth year], which is her daughters date of birth. They will need to receive the following prescriptions under the patients DOB before releasing them to the patient; promethazine (PHENERGAN) 25 MG tablet and zolpidem (AMBIEN) 10 MG tablet. Please assist.

## 2013-03-22 NOTE — Telephone Encounter (Signed)
Spoke to the pharmacist and gave the correct dob again.  Medications are in the correct patient chart.  Believe this was a mistake by the pharmacy.

## 2013-03-27 ENCOUNTER — Ambulatory Visit
Admission: RE | Admit: 2013-03-27 | Discharge: 2013-03-27 | Disposition: A | Discharge status: Home / Self Care | Payer: PRIVATE HEALTH INSURANCE | Source: Ambulatory Visit

## 2013-03-27 DIAGNOSIS — Z1231 Encounter for screening mammogram for malignant neoplasm of breast: Secondary | ICD-10-CM

## 2013-05-09 ENCOUNTER — Other Ambulatory Visit: Payer: Self-pay | Admitting: Obstetrics & Gynecology

## 2013-05-09 DIAGNOSIS — N951 Menopausal and female climacteric states: Secondary | ICD-10-CM

## 2013-06-16 ENCOUNTER — Other Ambulatory Visit: Payer: PRIVATE HEALTH INSURANCE

## 2013-07-11 ENCOUNTER — Other Ambulatory Visit: Payer: PRIVATE HEALTH INSURANCE

## 2013-09-26 ENCOUNTER — Ambulatory Visit
Admission: RE | Admit: 2013-09-26 | Discharge: 2013-09-26 | Disposition: A | Discharge status: Home / Self Care | Payer: No Typology Code available for payment source | Source: Ambulatory Visit | Attending: Obstetrics & Gynecology | Admitting: Obstetrics & Gynecology

## 2013-09-26 DIAGNOSIS — N951 Menopausal and female climacteric states: Secondary | ICD-10-CM

## 2013-10-06 ENCOUNTER — Encounter: Payer: Self-pay | Admitting: Internal Medicine

## 2013-10-06 ENCOUNTER — Ambulatory Visit (INDEPENDENT_AMBULATORY_CARE_PROVIDER_SITE_OTHER): Payer: PRIVATE HEALTH INSURANCE | Admitting: Internal Medicine

## 2013-10-06 VITALS — BP 104/60 | HR 87 | Temp 98.1°F | Ht 66.5 in | Wt 151.0 lb

## 2013-10-06 DIAGNOSIS — Z0000 Encounter for general adult medical examination without abnormal findings: Secondary | ICD-10-CM | POA: Insufficient documentation

## 2013-10-06 DIAGNOSIS — M949 Disorder of cartilage, unspecified: Secondary | ICD-10-CM

## 2013-10-06 DIAGNOSIS — Z23 Encounter for immunization: Secondary | ICD-10-CM

## 2013-10-06 DIAGNOSIS — Z2911 Encounter for prophylactic immunotherapy for respiratory syncytial virus (RSV): Secondary | ICD-10-CM

## 2013-10-06 DIAGNOSIS — M899 Disorder of bone, unspecified: Secondary | ICD-10-CM

## 2013-10-06 DIAGNOSIS — M8580 Other specified disorders of bone density and structure, unspecified site: Secondary | ICD-10-CM | POA: Insufficient documentation

## 2013-10-06 NOTE — Patient Instructions (Signed)
Continue lifestyle intervention healthy eating and exercise . cv risk is less than 1 % in the next 10 years.    Bone Health Our bones do many things. They provide structure, protect organs, anchor muscles, and store calcium. Adequate calcium in your diet and weight-bearing physical activity help build strong bones, improve bone amounts, and may reduce the risk of weakening of bones (osteoporosis) later in life. PEAK BONE MASS By age 61, the average woman has acquired most of her skeletal bone mass. A large decline occurs in older adults which increases the risk of osteoporosis. In women this occurs around the time of menopause. It is important for young girls to reach their peak bone mass in order to maintain bone health throughout life. A person with high bone mass as a young adult will be more likely to have a higher bone mass later in life. Not enough calcium consumption and physical activity early on could result in a failure to achieve optimum bone mass in adulthood. OSTEOPOROSIS Osteoporosis is a disease of the bones. It is defined as low bone mass with deterioration of bone structure. Osteoporosis leads to an increase risk of fractures with falls. These fractures commonly happen in the wrist, hip, and spine. While men and women of all ages and background can develop osteoporosis, some of the risk factors for osteoporosis are:  Female.  White.  Postmenopausal.  Older adults.  Small in body size.  Eating a diet low in calcium.  Physically inactive.  Smoking.  Use of some medications.  Family history. CALCIUM Calcium is a mineral needed by the body for healthy bones, teeth, and proper function of the heart, muscles, and nerves. The body cannot produce calcium so it must be absorbed through food. Good sources of calcium include:  Dairy products (low fat or nonfat milk, cheese, and yogurt).  Dark green leafy vegetables (bok choy and broccoli).  Calcium fortified foods  (orange juice, cereal, bread, soy beverages, and tofu products).  Nuts (almonds). Recommended amounts of calcium vary for individuals. RECOMMENDED CALCIUM INTAKES Age and Amount in mg per day  Children 1 to 3 years / 700 mg  Children 4 to 8 years / 1,000 mg  Children 9 to 13 years / 1,300 mg  Teens 14 to 18 years / 1,300 mg  Adults 19 to 50 years / 1,000 mg  Adult women 51 to 70 years / 1,200 mg  Adults 71 years and older / 1,200 mg  Pregnant and breastfeeding teens / 1,300 mg  Pregnant and breastfeeding adults / 1,000 mg Vitamin D also plays an important role in healthy bone development. Vitamin D helps in the absorption of calcium. WEIGHT-BEARING PHYSICAL ACTIVITY Regular physical activity has many positive health benefits. Benefits include strong bones. Weight-bearing physical activity early in life is important in reaching peak bone mass. Weight-bearing physical activities cause muscles and bones to work against gravity. Some examples of weight bearing physical activities include:  Walking, jogging, or running.  [employer].  Jumping rope.  Dancing.  Soccer.  Tennis or Racquetball.  Stair climbing.  Basketball.  Hiking.  Weight lifting.  Aerobic fitness classes. Including weight-bearing physical activity into an exercise plan is a great way to keep bones healthy. Adults: Engage in at least 30 minutes of moderate physical activity on most, preferably all, days of the week. Children: Engage in at least 60 minutes of moderate physical activity on most, preferably all, days of the week. FOR MORE INFORMATION [place] Web designer,  Center for Nutrition Policy and Promotion: www.cnpp.usda.[place]: EquipmentWeekly.com.ee Document Released: 08/29/2003 Document Revised: 10/03/2012 Document Reviewed: 11/28/2008 [facility] Patient Information 2014 [place], [place].

## 2013-10-06 NOTE — Assessment & Plan Note (Signed)
Gyne given rx for vit d  Counseled about results etc.

## 2013-10-06 NOTE — Progress Notes (Signed)
Chief Complaint  Patient presents with  . Annual Exam    HPI: Patient comes in today for [place] visit  She sees her gynecologist regularly he stated that it is time for her to see her primary care on a regular basis. She had her routine checkup which was normal but laboratory tests showed that her lipids were slightly elevated in her vitamin D was low. She was given a prescription vitamin D. Level was 21. Her bone density showed osteopenia at the LS spine is -2.1 hip was -1.4 She is a well does regular exercise doing boot camp type exercises good cardiovascular tolerance and healthy diet. "Beach body ready.  " program Mgm  Hp fracture otherwise no primary relatives with osteoporosis No osteoporosis.   Health Maintenance  Topic Date Due  . Pap Smear  06/22/2014 (Originally 12/31/2012)  . Influenza Vaccine  01/20/2014  . Mammogram  03/28/2015  . Colonoscopy  03/09/2021  . Tetanus/tdap  10/07/2023  . Zostavax  Completed   Health Maintenance Review  ROS:  GEN/ HEENT: No fever, significant weight changes sweats headaches vision problems hearing changes, CV/ PULM; No chest pain shortness of breath cough, syncope,edema  change in exercise tolerance. GI /GU: No adominal pain, vomiting, change in bowel habits. No blood in the stool. No significant GU symptoms. SKIN/HEME: ,no acute skin rashes suspicious lesions or bleeding. No lymphadenopathy, nodules, masses. Has seen a dermatologist for itching in her back and felt to have toe show paresthetica NEURO/ PSYCH:  No neurologic signs such as weakness numbness. No depression anxiety. IMM/ Allergy: No unusual infections.  Allergy .   REST of 12 system review negative except as per HPI   Past Medical History  Diagnosis Date  . Foot fracture     at gym  . Beta thalassemia trait   . Diarrhea 03/02/2011    Episodic watery usually . Explosive? And  Loss of control     Family History  Problem Relation Age of Onset  . Thyroid  disease Mother     partial thyroidectomy  . Pancreatic cancer Mother   . Cirrhosis Father     secondary to chronic hepatitis b infection  . Other Son     B Thal trait  . Breast cancer Paternal Aunt   . Colon cancer Neg Hx     History   Social History  . Marital Status: Married    Spouse Name: N/A    Number of Children: 3  . Years of Education: N/A   Occupational History  . Real Estate    Social History Main Topics  . Smoking status: Never Smoker   . Smokeless tobacco: Never Used  . Alcohol Use: Yes     Comment: occ  . Drug Use: No  . Sexual Activity: None   Other Topics Concern  . None   Social History Narrative   Married   Regular exercise- yes   Real estate-Management rentalproperties   3 children   2-3 caffeine drinks daily                Outpatient Encounter Prescriptions as of 10/06/2013  Medication Sig  . clobetasol cream (TEMOVATE) 0.05 %   . [DISCONTINUED] promethazine (PHENERGAN) 25 MG tablet Take 1 by mouth every 4-6 hours as needed.  . [DISCONTINUED] zolpidem (AMBIEN) 10 MG tablet Take 1/2 to 1 whole tablet at night as needed for sleep.    EXAM:  BP 104/60  Pulse 87  Temp(Src) 98.1 F (  36.7 C) (Oral)  Ht 5' 6.5" (1.689 m)  Wt 151 lb (68.493 kg)  BMI 24.01 kg/m2  SpO2 96%  Body mass index is 24.01 kg/(m^2).  Physical Exam: Vital signs reviewed ZSW:FUXN is a well-developed well-nourished alert cooperative    who appearsr stated age in no acute distress.  HEENT: normocephalic atraumatic , Eyes: PERRL EOM's full, conjunctiva clear, Nares: paten,t no deformity discharge or tenderness., Ears: no deformity EAC's clear TMs with normal landmarks. Mouth: clear OP, no lesions, edema.  Moist mucous membranes. Dentition in adequate repair. NECK: supple without masses, thyromegaly or bruits. CHEST/PULM:  Clear to auscultation and percussion breath sounds equal no wheeze , rales or rhonchi. No chest wall deformities or tenderness. Breast exam deferred  done by gynecologist CV: PMI is nondisplaced, S1 S2 no gallops, murmurs, rubs. Peripheral pulses are full without delay.No JVD .  ABDOMEN: Bowel sounds normal nontender  No guard or rebound, no hepato splenomegal no CVA tenderness.  No hernia. Extremtities:  No clubbing cyanosis or edema, no acute joint swelling or redness no focal atrophy NEURO:  Oriented x3, cranial nerves 3-12 appear to be intact, no obvious focal weakness,gait within normal limits no abnormal reflexes or asymmetrical SKIN: No acute rashes normal turgor, color, no bruising or petechiae. PSYCH: Oriented, good eye contact, no obvious depression anxiety, cognition and judgment appear normal. LN: no cervical axillary adenopathy Labs reviewed from gynecologist CMP is normal vitamin D 21.2 CBC consistent with her beta Thal normal TSH 1.16 total cholesterol 226 HDL 94 LDL 122 triglycerides 51. Pap smear HPV negative  ASSESSMENT AND PLAN:  Discussed the following assessment and plan:  Need for Tdap vaccination - Plan: Tdap vaccine greater than or equal to 7yo IM  Visit for preventive health examination - utd tdap and shingles today lipid profile good hdl 90  Need for shingles vaccine - Plan: Varicella-zoster vaccine subcutaneous  Osteopenia Counseled. Patient Care Team: Burnis Medin, MD as PCP - General Elveria Royals, MD (Obstetrics and Gynecology) Patient Instructions  Continue lifestyle intervention healthy eating and exercise . cv risk is less than 1 % in the next 10 years.    Bone Health Our bones do many things. They provide structure, protect organs, anchor muscles, and store calcium. Adequate calcium in your diet and weight-bearing physical activity help build strong bones, improve bone amounts, and may reduce the risk of weakening of bones (osteoporosis) later in life. PEAK BONE MASS By age 65, the average woman has acquired most of her skeletal bone mass. A large decline occurs in older adults which  increases the risk of osteoporosis. In women this occurs around the time of menopause. It is important for young girls to reach their peak bone mass in order to maintain bone health throughout life. A person with high bone mass as a young adult will be more likely to have a higher bone mass later in life. Not enough calcium consumption and physical activity early on could result in a failure to achieve optimum bone mass in adulthood. OSTEOPOROSIS Osteoporosis is a disease of the bones. It is defined as low bone mass with deterioration of bone structure. Osteoporosis leads to an increase risk of fractures with falls. These fractures commonly happen in the wrist, hip, and spine. While men and women of all ages and background can develop osteoporosis, some of the risk factors for osteoporosis are:  Female.  White.  Postmenopausal.  Older adults.  Small in body size.  Eating a diet low in  calcium.  Physically inactive.  Smoking.  Use of some medications.  Family history. CALCIUM Calcium is a mineral needed by the body for healthy bones, teeth, and proper function of the heart, muscles, and nerves. The body cannot produce calcium so it must be absorbed through food. Good sources of calcium include:  Dairy products (low fat or nonfat milk, cheese, and yogurt).  Dark green leafy vegetables (bok choy and broccoli).  Calcium fortified foods (orange juice, cereal, bread, soy beverages, and tofu products).  Nuts (almonds). Recommended amounts of calcium vary for individuals. RECOMMENDED CALCIUM INTAKES Age and Amount in mg per day  Children 1 to 3 years / 700 mg  Children 4 to 8 years / 1,000 mg  Children 9 to 13 years / 1,300 mg  Teens 14 to 18 years / 1,300 mg  Adults 19 to 50 years / 1,000 mg  Adult women 51 to 70 years / 1,200 mg  Adults 71 years and older / 1,200 mg  Pregnant and breastfeeding teens / 1,300 mg  Pregnant and breastfeeding adults / 1,000 mg Vitamin D  also plays an important role in healthy bone development. Vitamin D helps in the absorption of calcium. WEIGHT-BEARING PHYSICAL ACTIVITY Regular physical activity has many positive health benefits. Benefits include strong bones. Weight-bearing physical activity early in life is important in reaching peak bone mass. Weight-bearing physical activities cause muscles and bones to work against gravity. Some examples of weight bearing physical activities include:  Walking, jogging, or running.  [employer].  Jumping rope.  Dancing.  Soccer.  Tennis or Racquetball.  Stair climbing.  Basketball.  Hiking.  Weight lifting.  Aerobic fitness classes. Including weight-bearing physical activity into an exercise plan is a great way to keep bones healthy. Adults: Engage in at least 30 minutes of moderate physical activity on most, preferably all, days of the week. Children: Engage in at least 60 minutes of moderate physical activity on most, preferably all, days of the week. FOR MORE INFORMATION [place] Web designer, Soil scientist for [employer] and Promotion: www.cnpp.usda.[place]: EquipmentWeekly.com.ee Document Released: 08/29/2003 Document Revised: 10/03/2012 Document Reviewed: 11/28/2008 [facility] Patient Information 2014 [place], [place].     Standley Brooking. Kathlene Yano M.D.   Pre visit review using our clinic review tool, if applicable. No additional management support is needed unless otherwise documented below in the visit note.

## 2014-03-06 ENCOUNTER — Other Ambulatory Visit: Payer: Self-pay

## 2014-03-06 DIAGNOSIS — Z1231 Encounter for screening mammogram for malignant neoplasm of breast: Secondary | ICD-10-CM

## 2014-03-28 ENCOUNTER — Ambulatory Visit
Admission: RE | Admit: 2014-03-28 | Discharge: 2014-03-28 | Disposition: A | Discharge status: Home / Self Care | Payer: No Typology Code available for payment source | Source: Ambulatory Visit

## 2014-03-28 DIAGNOSIS — Z1231 Encounter for screening mammogram for malignant neoplasm of breast: Secondary | ICD-10-CM

## 2014-04-23 ENCOUNTER — Encounter: Payer: Self-pay | Admitting: Internal Medicine

## 2014-05-22 ENCOUNTER — Encounter: Payer: Self-pay | Admitting: Family Medicine

## 2014-06-01 ENCOUNTER — Encounter: Payer: Self-pay | Admitting: Internal Medicine

## 2014-11-06 ENCOUNTER — Encounter: Payer: Self-pay | Admitting: Internal Medicine

## 2015-01-09 ENCOUNTER — Ambulatory Visit: Payer: No Typology Code available for payment source | Admitting: Diagnostic Neuroimaging

## 2015-02-12 ENCOUNTER — Ambulatory Visit: Payer: No Typology Code available for payment source | Admitting: Neurology

## 2015-02-13 ENCOUNTER — Ambulatory Visit (INDEPENDENT_AMBULATORY_CARE_PROVIDER_SITE_OTHER): Payer: No Typology Code available for payment source | Admitting: Neurology

## 2015-02-13 ENCOUNTER — Encounter: Payer: Self-pay | Admitting: Neurology

## 2015-02-13 VITALS — BP 119/73 | HR 93 | Ht 67.0 in | Wt 149.5 lb

## 2015-02-13 DIAGNOSIS — R202 Paresthesia of skin: Secondary | ICD-10-CM

## 2015-02-13 NOTE — Progress Notes (Signed)
Reason for visit: Paresthesias, left fourth toe  Referring physician: Dr. Grandville [name] is a 64 y.o. female  History of present illness:  Heather Goodwin is a 64 year old right-handed white female with a two-year history of some sensory alteration involving the fourth toe on the left foot only. The patient indicates that this sensation is not noted all the time, but is most prominent when she is wearing tennis shoes or high heels. At times she may have some discomfort in the left lower back, with some unusual sensory alterations that seem to go down the left leg to the foot, without discomfort, or true numbness or tingling. This issue may come and go, and has been present for 3 years or more. She denies any weakness of the extremities, she denies any true back pain or neck discomfort. She has not had any sensory alterations on the face or arms or body. She denies any issues controlling the bowels or the bladder, or difficulty with balance. She remains quite active, she plays tennis on a regular basis. The episodes of sensory alteration may be noted for several minutes, lasting for up to 20 minutes. She is sent to this office for further evaluation. She denies any other symptoms such as headache, memory problems, speech problems, or dizziness.  Past Medical History  Diagnosis Date  . Foot fracture     at gym  . Beta thalassemia trait   . Diarrhea 03/02/2011    Episodic watery usually . Explosive? And  Loss of control     Past Surgical History  Procedure Laterality Date  . Dexa      normal at gyn  . Left knee surgery  2009    meniscus   . US echocardiography  2005    normal for syncope     Family History  Problem Relation Age of Onset  . Thyroid disease Mother     partial thyroidectomy  . Pancreatic cancer Mother   . Cirrhosis Father     secondary to chronic hepatitis b infection  . Other Son     B Thal trait  . Breast cancer Paternal Aunt   . Colon cancer Neg Hx   .  Breast cancer Maternal Aunt   . Pancreatic cancer Maternal Grandfather     Social history:  reports that she has never smoked. She has never used smokeless tobacco. She reports that she drinks about 0.6 - 2.4 oz of alcohol per week. She reports that she does not use illicit drugs.  Medications:  Prior to Admission medications   Not on File     No Known Allergies  ROS:  Out of a complete 14 system review of symptoms, the patient complains only of the following symptoms, and all other reviewed systems are negative.  Numbness  Blood pressure 119/73, pulse 93, height 5\' 7"  (1.702 m), weight 149 lb 8 oz (67.813 kg).  Physical Exam  General: The patient is alert and cooperative at the time of the examination.  Eyes: Pupils are equal, round, and reactive to light. Discs are flat bilaterally.  Neck: The neck is supple, no carotid bruits are noted.  Respiratory: The respiratory examination is clear.  Cardiovascular: The cardiovascular examination reveals a regular rate and rhythm, no obvious murmurs or rubs are noted.  Neuromuscular: Range of movement of the low back is full.  Skin: Extremities are without significant edema.  Neurologic Exam  Mental status: The patient is alert and oriented x 3 at  the time of the examination. The patient has apparent normal recent and remote memory, with an apparently normal attention span and concentration ability.  Cranial nerves: Facial symmetry is present. There is good sensation of the face to pinprick and soft touch bilaterally. The strength of the facial muscles and the muscles to head turning and shoulder shrug are normal bilaterally. Speech is well enunciated, no aphasia or dysarthria is noted. Extraocular movements are full. Visual fields are full. The tongue is midline, and the patient has symmetric elevation of the soft palate. No obvious hearing deficits are noted.  Motor: The motor testing reveals 5 over 5 strength of all 4  extremities. Good symmetric motor tone is noted throughout.  Sensory: Sensory testing is intact to pinprick, soft touch, vibration sensation, and position sense on all 4 extremities. No evidence of extinction is noted.  Coordination: Cerebellar testing reveals good finger-nose-finger and heel-to-shin bilaterally.  Gait and station: Gait is normal. Tandem gait is normal. Romberg is negative. No drift is seen.  Reflexes: Deep tendon reflexes are symmetric and normal bilaterally. Toes are downgoing bilaterally.   Assessment/Plan:  1. Sensory alteration, left leg  The patient reports a long duration intermittent history of subjective sensory alteration involving the left fourth toe, and occasionally down the left leg and back. It is difficult to know how aggressive to be with this evaluation, the patient wishes to pursue further workup. We will check nerve conduction studies of both legs, EMG of the left leg. We may consider further evaluation depending upon the results of the above. She will follow-up for the EMG.  Jill Alexanders MD 02/13/2015 7:10 PM  [facility] [address] [place], [place] [address]  Phone [phone] Fax [phone]

## 2015-02-14 ENCOUNTER — Telehealth: Payer: Self-pay

## 2015-02-14 NOTE — Telephone Encounter (Signed)
Called and left message to have pt call back to schedule NVC/EMG with Dr. Jannifer Franklin...she checked out when EPIC was down on 02/13/2015

## 2015-03-04 ENCOUNTER — Other Ambulatory Visit: Payer: Self-pay

## 2015-03-04 DIAGNOSIS — Z1231 Encounter for screening mammogram for malignant neoplasm of breast: Secondary | ICD-10-CM

## 2015-03-15 ENCOUNTER — Encounter: Payer: No Typology Code available for payment source | Admitting: Neurology

## 2015-03-22 ENCOUNTER — Encounter: Payer: Self-pay | Admitting: Neurology

## 2015-03-22 ENCOUNTER — Ambulatory Visit (INDEPENDENT_AMBULATORY_CARE_PROVIDER_SITE_OTHER): Payer: No Typology Code available for payment source | Admitting: Neurology

## 2015-03-22 ENCOUNTER — Ambulatory Visit (INDEPENDENT_AMBULATORY_CARE_PROVIDER_SITE_OTHER): Payer: Self-pay | Admitting: Neurology

## 2015-03-22 DIAGNOSIS — R202 Paresthesia of skin: Secondary | ICD-10-CM

## 2015-03-22 NOTE — Progress Notes (Signed)
Please refer to EMG and nerve conduction study procedure note. 

## 2015-03-22 NOTE — Procedures (Signed)
     HISTORY:  Heather Goodwin is a 64 year old patient with intermittent mild tingling and sensory alteration of her fourth toe on her left foot. There is no significant discomfort involved with the foot. The patient is being evaluated for possible neuropathy or a radiculopathy.  NERVE CONDUCTION STUDIES:  Nerve conduction studies were performed on both lower extremities. The distal motor latencies and motor amplitudes for the peroneal and posterior tibial nerves were within normal limits, with the exception that the motor amplitudes for the left peroneal nerve were low. The nerve conduction velocities for these nerves were also normal. The H reflex latencies were normal. The sensory latencies for the peroneal nerves were within normal limits.   EMG STUDIES:  EMG study was performed on the left lower extremity:  The tibialis anterior muscle reveals 2 to 4K motor units with full recruitment. No fibrillations or positive waves were seen. The peroneus tertius muscle reveals 2 to 4K motor units with full recruitment. No fibrillations or positive waves were seen. The medial gastrocnemius muscle reveals 1 to 3K motor units with full recruitment. No fibrillations or positive waves were seen. The vastus lateralis muscle reveals 2 to 4K motor units with full recruitment. No fibrillations or positive waves were seen. The iliopsoas muscle reveals 2 to 4K motor units with full recruitment. No fibrillations or positive waves were seen. The biceps femoris muscle (long head) reveals 2 to 4K motor units with full recruitment. No fibrillations or positive waves were seen. The lumbosacral paraspinal muscles were tested at 3 levels, and revealed no abnormalities of insertional activity at all 3 levels tested. There was good relaxation.   IMPRESSION:  Nerve conduction studies done on the lower extremities shows evidence of a lowering of motor amplitudes for the left peroneal nerve, with sensory sparing.  Otherwise, nerve conduction studies are unremarkable. There is no evidence of a peripheral neuropathy. EMG evaluation of the left lower extremity was unremarkable, without evidence of an overlying lumbosacral radiculopathy. 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 No other significant abnormalities were seen.  Jill Alexanders MD 03/22/2015 11:34 AM  [facility] [address] [place], [place] [address]  Phone [phone] Fax [phone]

## 2015-04-10 ENCOUNTER — Ambulatory Visit
Admission: RE | Admit: 2015-04-10 | Discharge: 2015-04-10 | Disposition: A | Discharge status: Home / Self Care | Payer: No Typology Code available for payment source | Source: Ambulatory Visit

## 2015-04-10 DIAGNOSIS — Z1231 Encounter for screening mammogram for malignant neoplasm of breast: Secondary | ICD-10-CM

## 2015-05-13 ENCOUNTER — Encounter: Payer: Self-pay | Admitting: Neurology

## 2015-07-01 ENCOUNTER — Telehealth: Payer: Self-pay | Admitting: Internal Medicine

## 2015-07-01 MED ORDER — SCOPOLAMINE 1 MG/3DAYS TD PT72: 1.0000 | MEDICATED_PATCH | TRANSDERMAL | Status: DC

## 2015-07-01 NOTE — Telephone Encounter (Signed)
Patient to go on sailing trip and requests sea sick patches   To help . No change in status inhaled corticosteroids healthy otherwise reviewed record.  Will be sent in for 7 day trip.

## 2016-03-25 ENCOUNTER — Other Ambulatory Visit: Payer: Self-pay | Admitting: Internal Medicine

## 2016-03-25 DIAGNOSIS — Z1231 Encounter for screening mammogram for malignant neoplasm of breast: Secondary | ICD-10-CM

## 2016-04-13 ENCOUNTER — Ambulatory Visit: Payer: No Typology Code available for payment source

## 2016-05-21 ENCOUNTER — Ambulatory Visit
Admission: RE | Admit: 2016-05-21 | Discharge: 2016-05-21 | Disposition: A | Discharge status: Home / Self Care | Payer: Managed Care, Other (non HMO) | Source: Ambulatory Visit | Attending: Internal Medicine | Admitting: Internal Medicine

## 2016-05-21 DIAGNOSIS — Z1231 Encounter for screening mammogram for malignant neoplasm of breast: Secondary | ICD-10-CM

## 2017-04-13 ENCOUNTER — Other Ambulatory Visit: Payer: Self-pay | Admitting: Internal Medicine

## 2017-04-13 DIAGNOSIS — Z1231 Encounter for screening mammogram for malignant neoplasm of breast: Secondary | ICD-10-CM

## 2017-05-24 ENCOUNTER — Ambulatory Visit
Admission: RE | Admit: 2017-05-24 | Discharge: 2017-05-24 | Disposition: A | Discharge status: Home / Self Care | Payer: Managed Care, Other (non HMO) | Source: Ambulatory Visit | Attending: Internal Medicine | Admitting: Internal Medicine

## 2017-05-24 DIAGNOSIS — Z1231 Encounter for screening mammogram for malignant neoplasm of breast: Secondary | ICD-10-CM

## 2017-12-06 ENCOUNTER — Telehealth: Payer: Self-pay | Admitting: Internal Medicine

## 2017-12-06 DIAGNOSIS — M7989 Other specified soft tissue disorders: Secondary | ICD-10-CM

## 2017-12-06 NOTE — Telephone Encounter (Signed)
Talked to pat yesterday  Cop pof 3 weeks of inc swelling  right tindex finger distal with mild redness   Not sore and o recent trauma but  Had old bumpy area from trauma of smashing finger in door in past   .  traveled to [place] recently but no systemic sx exposures  Etc  .  Plain  x ray finger and then plan fu visit   Doesn't seem like infection  Based on picture but is pink and dip looks enlarged

## 2017-12-07 ENCOUNTER — Ambulatory Visit (INDEPENDENT_AMBULATORY_CARE_PROVIDER_SITE_OTHER)
Admission: RE | Admit: 2017-12-07 | Discharge: 2017-12-07 | Disposition: A | Discharge status: Home / Self Care | Payer: 59 | Source: Ambulatory Visit | Attending: Internal Medicine | Admitting: Internal Medicine

## 2017-12-07 DIAGNOSIS — M7989 Other specified soft tissue disorders: Secondary | ICD-10-CM

## 2017-12-28 ENCOUNTER — Telehealth: Payer: Self-pay | Admitting: *Deleted

## 2017-12-28 NOTE — Telephone Encounter (Signed)
Yes   She wants an early am appt so see if can find a spot  Can put 2 together and I can see her on a Thursday   . July 25 is wide open  And ok to use

## 2017-12-28 NOTE — Telephone Encounter (Signed)
Please advise Dr Regis Bill if you are okay with re-establishing care. Thanks.

## 2017-12-28 NOTE — Telephone Encounter (Signed)
Patient could not talk when I called.  Pt to call back to schedule later.  Aware that we close at [place] created - please schedule appt with Dr Regis Bill Can use: 01/06/18 at 9:00am (84mins combine slots) for re-establish care 01/13/18 at 8:30am (19mins combine slots) for re-establish care 01/13/18 at 10:00am (25mins combine slots) for re-establish care 01/27/18 at 8:30am (58mins combine slots) for re-establish care 01/27/18 at 10:00am (78mins combine slots) for re-establish care

## 2017-12-28 NOTE — Telephone Encounter (Signed)
Copied from [place] [id number]. Topic: General - Other >> Dec 28, 2017 11:21 AM Yvette Rack wrote: Reason for CRM: Pt states she needs to schedule an appt with Dr. Regis Bill for a physical. Pt was last seen 10/06/13. Pt requests call back. Cb# [phone]

## 2017-12-29 NOTE — Telephone Encounter (Signed)
Pt scheduled on 01/13/18 at 830am Pt made aware of appt date/time  Nothing further needed.

## 2018-01-11 NOTE — Progress Notes (Signed)
Chief Complaint  Patient presents with  . Annual Exam    Stil lhaving swelling with right pointer finger.     HPI: Patient  Heather Goodwin  67 y.o. comes in today for Preventive Health Care visit  Has gyne  Checks  Dr Benjie Karvonen   Last  Check up ov over 3-4 years ago . 2015? Generally well   thalassemia minor .  ? Subtle   Catching breath    At times.   Good exercise tolerance and no cp sob and dose   Boot camp some    fam hx of pancreatic cancer .  Mom and  MGF  Passed from disease   other second deg rel of breast  Are there screening or other  Factors that would help dx  Or assess risk . ?   5 year ago   Had  [place] of  Diarrhea and  Had colonoscopy dr Olevia Perches  Most  recently had another episode when walking on the beach   Of  Urgent diarrhea .  Watery no blood but  Not able to get to bathroom in time .  Nl pattern 2-3 x per day has high fiber diet   No excess etoh.   Finger in past month nodule   Hx of injury smashed in door a few years ago but no   Noted trauma since   . No arthritis  and no hx gout    Health Maintenance  Topic Date Due  . Hepatitis C Screening  [birth year]  . HIV Screening  [birth year]/1968  . PAP SMEAR  01/13/2018 (Originally 03/11/2015)  . INFLUENZA VACCINE  01/20/2018  . MAMMOGRAM  05/25/2019  . COLONOSCOPY  03/09/2021  . TETANUS/TDAP  10/07/2023   Health Maintenance Review LIFESTYLE:  Exercise:   Active  Exercise  Tobacco/ETS:n Alcohol: ocass Sugar beverages:n Sleep: Drug use: no HH of 2 no pets  Work: ft owns business    ROS:  GEN/ HEENT: No fever, significant weight changes sweats headaches vision problems hearing changes, CV/ PULM; No chest pain shortness of breath cough, syncope,edema  change in exercise tolerance. GI /GU: No adominal pain, vomiting, change in bowel habits. No blood in the stool. No significant GU symptoms. SKIN/HEME: ,no acute skin rashes suspicious lesions or bleeding. No lymphadenopathy, nodules, masses.  NEURO/ PSYCH:  No  neurologic signs such as weakness numbness. No depression anxiety. IMM/ Allergy: No unusual infections.  Allergy .   REST of 12 system review negative except as per HPI   Past Medical History:  Diagnosis Date  . Beta thalassemia trait   . Diarrhea 03/02/2011   Episodic watery usually . Explosive? And  Loss of control   . Foot fracture    at gym    Past Surgical History:  Procedure Laterality Date  . dexa     normal at gyn  . left knee surgery  2009   meniscus   . US ECHOCARDIOGRAPHY  2005   normal for syncope     Family History  Problem Relation Age of Onset  . Thyroid disease Mother        partial thyroidectomy  . Pancreatic cancer Mother   . Cirrhosis Father        secondary to chronic hepatitis b infection  . Other Son        B Thal trait  . Breast cancer Paternal Aunt   . Breast cancer Maternal Aunt   . Pancreatic cancer Maternal Grandfather   . Colon  cancer Neg Hx     Social History   Socioeconomic History  . Marital status: Married    Spouse name: Not on file  . Number of children: 3  . Years of education: Not on file  . Highest education level: Not on file  Occupational History  . Occupation: Real [employer]  . Financial resource strain: Not on file  . Food insecurity:    Worry: Not on file    Inability: Not on file  . Transportation needs:    Medical: Not on file    Non-medical: Not on file  Tobacco Use  . Smoking status: Never Smoker  . Smokeless tobacco: Never Used  Substance and Sexual Activity  . Alcohol use: Yes    Alcohol/week: 0.6 - 2.4 oz    Types: 1 - 4 Glasses of wine per week    Comment: occ  . Drug use: No  . Sexual activity: Not on file  Lifestyle  . Physical activity:    Days per week: Not on file    Minutes per session: Not on file  . Stress: Not on file  Relationships  . Social connections:    Talks on phone: Not on file    Gets together: Not on file    Attends religious service: Not on file    Active member  of club or organization: Not on file    Attends meetings of clubs or organizations: Not on file    Relationship status: Not on file  Other Topics Concern  . Not on file  Social History Narrative   Married   Regular exercise- yes   Real estate-Management rentalproperties   3 children   2-3 caffeine drinks daily   Patient is right handed.          Outpatient Medications Prior to Visit  Medication Sig Dispense Refill  . scopolamine (TRANSDERM-SCOP, 1.5 MG,) 1 MG/3DAYS Place 1 patch (1.5 mg total) onto the skin every 3 (three) days. for sea sickness 4 patch 1   No facility-administered medications prior to visit.      EXAM:  BP 102/66 (BP Location: Left Arm, Patient Position: Sitting, Cuff Size: Normal)   Pulse 99   Temp 98.4 F (36.9 C) (Oral)   Ht 5' 7" (1.702 m)   Wt 152 lb (68.9 kg)   BMI 23.81 kg/m   Body mass index is 23.81 kg/m. Wt Readings from Last 3 Encounters:  01/13/18 152 lb (68.9 kg)  02/13/15 149 lb 8 oz (67.8 kg)  10/06/13 151 lb (68.5 kg)    Physical Exam: Vital signs reviewed QIW:LNLG is a well-developed well-nourished alert cooperative    who appearsr stated age in no acute distress.  HEENT: normocephalic atraumatic , Eyes: PERRL EOM's full, conjunctiva clear, Nares: paten,t no deformity discharge or tenderness., Ears: no deformity EAC's clear TMs with normal landmarks. Mouth: clear OP, no lesions, edema.  Moist mucous membranes. Dentition in adequate repair. NECK: supple without masses, thyromegaly or bruits. CHEST/PULM:  Clear to auscultation and percussion breath sounds equal no wheeze , rales or rhonchi. No chest wall deformities or tenderness. Breast: normal by inspection . No dimpling, discharge, masses, tenderness or discharge . CV: PMI is nondisplaced, S1 S2 no gallops, murmurs, rubs. Peripheral pulses are full without delay.No JVD .  ABDOMEN: Bowel sounds normal nontender  No guard or rebound, no hepato splenomegal no CVA tenderness.  No  hernia. Extremtities:  No clubbing cyanosis or edema, no acute joint swelling or  redness no focal atrophy NEURO:  Oriented x3, cranial nerves 3-12 appear to be intact, no obvious focal weakness,gait within normal limits no abnormal reflexes or asymmetrical SKIN: No acute rashes normal turgor, color, no bruising or petechiae. PSYCH: Oriented, good eye contact, no obvious depression anxiety, cognition and judgment appear normal. LN: no cervical axillary inguinal adenopathy  Lab Results  Component Value Date   WBC 6.3 03/02/2011   HGB 12.9 03/02/2011   HCT 41.6 03/02/2011   PLT 208.0 03/02/2011   GLUCOSE 82 03/02/2011   CHOL 223 (H) 03/02/2011   TRIG 83.0 03/02/2011   HDL 85.60 03/02/2011   LDLDIRECT 123.1 03/02/2011   LDLCALC 101 09/19/2007   ALT 17 03/02/2011   AST 18 03/02/2011   NA 141 03/02/2011   K 4.5 03/02/2011   CL 104 03/02/2011   CREATININE 0.7 03/02/2011   BUN 19 03/02/2011   CO2 27 03/02/2011   TSH 1.16 03/02/2011    BP Readings from Last 3 Encounters:  01/13/18 102/66  02/13/15 119/73  10/06/13 104/60    Lab results reviewed with patient   ASSESSMENT AND PLAN:  Discussed the following assessment and plan:  Visit for preventive health examination - Plan: Basic metabolic panel, CBC with Differential/Platelet, Hepatic function panel, TSH, Lipid panel  Family history of malignant neoplasm of pancreas - Plan: Basic metabolic panel, CBC with Differential/Platelet, Hepatic function panel, TSH, Lipid panel, Ambulatory referral to Gastroenterology, Ambulatory referral to Genetics  Screening, lipid - Plan: Basic metabolic panel, CBC with Differential/Platelet, Hepatic function panel, TSH, Lipid panel  Fecal urgency - Plan: Basic metabolic panel, CBC with Differential/Platelet, Hepatic function panel, TSH, Lipid panel, Ambulatory referral to Gastroenterology  OTHER THALASSEMIA - Plan: Basic metabolic panel, CBC with Differential/Platelet, Hepatic function panel, TSH,  Lipid panel  Swelling of right index finger - Plan: Basic metabolic panel, CBC with Differential/Platelet, Hepatic function panel, TSH, Lipid panel, Uric Acid  Nodule of finger, unspecified laterality - suspect post traumatic  changes albeit later  can check with hand specialist  Report that  Hep c done via  GYNE  shingrix vaccine list   Reviewed above  And see below    Plan  get in with new GI doc  And  Refer for advisability of genetic testing or other  To assess risk  Patient Care Team: Burnis Medin, MD as PCP - General Azucena Fallen, MD (Obstetrics and Gynecology) Wylene Simmer, MD as Consulting Physician (Orthopedic Surgery) Patient Instructions  Plan Gi  Appt.  Shingrix vaccine. Will be contacted about  Genetic counselor appt. ( from cancer center usually)   Get Dr Patrica Duel to see your finger  . Could be post traumatic arthritis changes even if delayed .  Continue lifestyle intervention healthy eating and exercise .  Health Maintenance, Female Adopting a healthy lifestyle and getting preventive care can go a long way to promote health and wellness. Talk with your health care provider about what schedule of regular examinations is right for you. This is a good chance for you to check in with your provider about disease prevention and staying healthy. In between checkups, there are plenty of things you can do on your own. Experts have done a lot of research about which lifestyle changes and preventive measures are most likely to keep you healthy. Ask your health care provider for more information. Weight and diet Eat a healthy diet  Be sure to include plenty of vegetables, fruits, low-fat dairy products, and lean protein.  Do not eat  a lot of foods high in solid fats, added sugars, or salt.  Get regular exercise. This is one of the most important things you can do for your health. ? Most adults should exercise for at least 150 minutes each week. The exercise should increase your  heart rate and make you sweat (moderate-intensity exercise). ? Most adults should also do strengthening exercises at least twice a week. This is in addition to the moderate-intensity exercise.  Maintain a healthy weight  Body mass index (BMI) is a measurement that can be used to identify possible weight problems. It estimates body fat based on height and weight. Your health care provider can help determine your BMI and help you achieve or maintain a healthy weight.  For females 62 years of age and older: ? A BMI below 18.5 is considered underweight. ? A BMI of 18.5 to 24.9 is normal. ? A BMI of 25 to 29.9 is considered overweight. ? A BMI of 30 and above is considered obese.  Watch levels of cholesterol and blood lipids  You should start having your blood tested for lipids and cholesterol at 65 years of age, then have this test every 5 years.  You may need to have your cholesterol levels checked more often if: ? Your lipid or cholesterol levels are high. ? You are older than 65 years of age. ? You are at high risk for heart disease.  Cancer screening Lung Cancer  Lung cancer screening is recommended for adults 32-88 years old who are at high risk for lung cancer because of a history of smoking.  A yearly low-dose CT scan of the lungs is recommended for people who: ? Currently smoke. ? Have quit within the past 15 years. ? Have at least a 30-pack-year history of smoking. A pack year is smoking an average of one pack of cigarettes a day for 1 year.  Yearly screening should continue until it has been 15 years since you quit.  Yearly screening should stop if you develop a health problem that would prevent you from having lung cancer treatment.  Breast Cancer  Practice breast self-awareness. This means understanding how your breasts normally appear and feel.  It also means doing regular breast self-exams. Let your health care provider know about any changes, no matter how  small.  If you are in your 20s or 30s, you should have a clinical breast exam (CBE) by a health care provider every 1-3 years as part of a regular health exam.  If you are 73 or older, have a CBE every year. Also consider having a breast X-ray (mammogram) every year.  If you have a family history of breast cancer, talk to your health care provider about genetic screening.  If you are at high risk for breast cancer, talk to your health care provider about having an MRI and a mammogram every year.  Breast cancer gene (BRCA) assessment is recommended for women who have family members with BRCA-related cancers. BRCA-related cancers include: ? Breast. ? Ovarian. ? Tubal. ? Peritoneal cancers.  Results of the assessment will determine the need for genetic counseling and BRCA1 and BRCA2 testing.  Cervical Cancer Your health care provider may recommend that you be screened regularly for cancer of the pelvic organs (ovaries, uterus, and vagina). This screening involves a pelvic examination, including checking for microscopic changes to the surface of your cervix (Pap test). You may be encouraged to have this screening done every 3 years, beginning at age 29.  For women ages 73-65, health care providers may recommend pelvic exams and Pap testing every 3 years, or they may recommend the Pap and pelvic exam, combined with testing for human papilloma virus (HPV), every 5 years. Some types of HPV increase your risk of cervical cancer. Testing for HPV may also be done on women of any age with unclear Pap test results.  Other health care providers may not recommend any screening for nonpregnant women who are considered low risk for pelvic cancer and who do not have symptoms. Ask your health care provider if a screening pelvic exam is right for you.  If you have had past treatment for cervical cancer or a condition that could lead to cancer, you need Pap tests and screening for cancer for at least 20 years  after your treatment. If Pap tests have been discontinued, your risk factors (such as having a new sexual partner) need to be reassessed to determine if screening should resume. Some women have medical problems that increase the chance of getting cervical cancer. In these cases, your health care provider may recommend more frequent screening and Pap tests.  Colorectal Cancer  This type of cancer can be detected and often prevented.  Routine colorectal cancer screening usually begins at 65 years of age and continues through 65 years of age.  Your health care provider may recommend screening at an earlier age if you have risk factors for colon cancer.  Your health care provider may also recommend using home test kits to check for hidden blood in the stool.  A small camera at the end of a tube can be used to examine your colon directly (sigmoidoscopy or colonoscopy). This is done to check for the earliest forms of colorectal cancer.  Routine screening usually begins at age 80.  Direct examination of the colon should be repeated every 5-10 years through 65 years of age. However, you may need to be screened more often if early forms of precancerous polyps or small growths are found.  Skin Cancer  Check your skin from head to toe regularly.  Tell your health care provider about any new moles or changes in moles, especially if there is a change in a mole's shape or color.  Also tell your health care provider if you have a mole that is larger than the size of a pencil eraser.  Always use sunscreen. Apply sunscreen liberally and repeatedly throughout the day.  Protect yourself by wearing long sleeves, pants, a wide-brimmed hat, and sunglasses whenever you are outside.  Heart disease, diabetes, and high blood pressure  High blood pressure causes heart disease and increases the risk of stroke. High blood pressure is more likely to develop in: ? People who have blood pressure in the high end of  the normal range (130-139/85-89 mm Hg). ? People who are overweight or obese. ? People who are African American.  If you are 9-24 years of age, have your blood pressure checked every 3-5 years. If you are 51 years of age or older, have your blood pressure checked every year. You should have your blood pressure measured twice-once when you are at a hospital or clinic, and once when you are not at a hospital or clinic. Record the average of the two measurements. To check your blood pressure when you are not at a hospital or clinic, you can use: ? An automated blood pressure machine at a pharmacy. ? A home blood pressure monitor.  If you are between 55 years  and 1 years old, ask your health care provider if you should take aspirin to prevent strokes.  Have regular diabetes screenings. This involves taking a blood sample to check your fasting blood sugar level. ? If you are at a normal weight and have a low risk for diabetes, have this test once every three years after 65 years of age. ? If you are overweight and have a high risk for diabetes, consider being tested at a younger age or more often. Preventing infection Hepatitis B  If you have a higher risk for hepatitis B, you should be screened for this virus. You are considered at high risk for hepatitis B if: ? You were born in a country where hepatitis B is common. Ask your health care provider which countries are considered high risk. ? Your parents were born in a high-risk country, and you have not been immunized against hepatitis B (hepatitis B vaccine). ? You have HIV or AIDS. ? You use needles to inject street drugs. ? You live with someone who has hepatitis B. ? You have had sex with someone who has hepatitis B. ? You get hemodialysis treatment. ? You take certain medicines for conditions, including cancer, organ transplantation, and autoimmune conditions.  Hepatitis C  Blood testing is recommended for: ? Everyone born from [birth year]  through 1963. ? Anyone with known risk factors for hepatitis C.  Sexually transmitted infections (STIs)  You should be screened for sexually transmitted infections (STIs) including gonorrhea and chlamydia if: ? You are sexually active and are younger than 65 years of age. ? You are older than 65 years of age and your health care provider tells you that you are at risk for this type of infection. ? Your sexual activity has changed since you were last screened and you are at an increased risk for chlamydia or gonorrhea. Ask your health care provider if you are at risk.  If you do not have HIV, but are at risk, it may be recommended that you take a prescription medicine daily to prevent HIV infection. This is called pre-exposure prophylaxis (PrEP). You are considered at risk if: ? You are sexually active and do not regularly use condoms or know the HIV status of your partner(s). ? You take drugs by injection. ? You are sexually active with a partner who has HIV.  Talk with your health care provider about whether you are at high risk of being infected with HIV. If you choose to begin PrEP, you should first be tested for HIV. You should then be tested every 3 months for as long as you are taking PrEP. Pregnancy  If you are premenopausal and you may become pregnant, ask your health care provider about preconception counseling.  If you may become pregnant, take 400 to 800 micrograms (mcg) of folic acid every day.  If you want to prevent pregnancy, talk to your health care provider about birth control (contraception). Osteoporosis and menopause  Osteoporosis is a disease in which the bones lose minerals and strength with aging. This can result in serious bone fractures. Your risk for osteoporosis can be identified using a bone density scan.  If you are 62 years of age or older, or if you are at risk for osteoporosis and fractures, ask your health care provider if you should be screened.  Ask  your health care provider whether you should take a calcium or vitamin D supplement to lower your risk for osteoporosis.  Menopause may have certain physical  symptoms and risks.  Hormone replacement therapy may reduce some of these symptoms and risks. Talk to your health care provider about whether hormone replacement therapy is right for you. Follow these instructions at home:  Schedule regular health, dental, and eye exams.  Stay current with your immunizations.  Do not use any tobacco products including cigarettes, chewing tobacco, or electronic cigarettes.  If you are pregnant, do not drink alcohol.  If you are breastfeeding, limit how much and how often you drink alcohol.  Limit alcohol intake to no more than 1 drink per day for nonpregnant women. One drink equals 12 ounces of beer, 5 ounces of wine, or 1 ounces of hard liquor.  Do not use street drugs.  Do not share needles.  Ask your health care provider for help if you need support or information about quitting drugs.  Tell your health care provider if you often feel depressed.  Tell your health care provider if you have ever been abused or do not feel safe at home. This information is not intended to replace advice given to you by your health care provider. Make sure you discuss any questions you have with your health care provider. Document Released: 12/22/2010 Document Revised: 11/14/2015 Document Reviewed: 03/12/2015 Elsevier Interactive Patient Education  2018 [employer]. Panosh M.D.

## 2018-01-13 ENCOUNTER — Ambulatory Visit (INDEPENDENT_AMBULATORY_CARE_PROVIDER_SITE_OTHER): Payer: BLUE CROSS/BLUE SHIELD | Admitting: Internal Medicine

## 2018-01-13 ENCOUNTER — Encounter: Payer: Self-pay | Admitting: Internal Medicine

## 2018-01-13 VITALS — BP 102/66 | HR 99 | Temp 98.4°F | Ht 67.0 in | Wt 152.0 lb

## 2018-01-13 DIAGNOSIS — R2230 Localized swelling, mass and lump, unspecified upper limb: Secondary | ICD-10-CM

## 2018-01-13 DIAGNOSIS — Z13220 Encounter for screening for lipoid disorders: Secondary | ICD-10-CM | POA: Diagnosis not present

## 2018-01-13 DIAGNOSIS — D568 Other thalassemias: Secondary | ICD-10-CM

## 2018-01-13 DIAGNOSIS — M7989 Other specified soft tissue disorders: Secondary | ICD-10-CM | POA: Diagnosis not present

## 2018-01-13 DIAGNOSIS — Z800 Family history of malignant neoplasm of digestive organs: Secondary | ICD-10-CM

## 2018-01-13 DIAGNOSIS — Z0000 Encounter for general adult medical examination without abnormal findings: Secondary | ICD-10-CM | POA: Diagnosis not present

## 2018-01-13 DIAGNOSIS — R152 Fecal urgency: Secondary | ICD-10-CM

## 2018-01-13 LAB — CBC WITH DIFFERENTIAL/PLATELET
Basophils Absolute: 0 10*3/uL (ref 0.0–0.1)
Basophils Relative: 0.6 % (ref 0.0–3.0)
EOS ABS: 0.1 10*3/uL (ref 0.0–0.7)
Eosinophils Relative: 1.2 % (ref 0.0–5.0)
HCT: 39.5 % (ref 36.0–46.0)
Hemoglobin: 12.6 g/dL (ref 12.0–15.0)
LYMPHS PCT: 17.7 % (ref 12.0–46.0)
Lymphs Abs: 1.2 10*3/uL (ref 0.7–4.0)
MCHC: 31.9 g/dL (ref 30.0–36.0)
MCV: 62.6 fl — ABNORMAL LOW (ref 78.0–100.0)
MONO ABS: 0.5 10*3/uL (ref 0.1–1.0)
MONOS PCT: 7.5 % (ref 3.0–12.0)
NEUTROS PCT: 73 % (ref 43.0–77.0)
Neutro Abs: 4.8 10*3/uL (ref 1.4–7.7)
PLATELETS: 249 10*3/uL (ref 150.0–400.0)
RBC: 6.31 Mil/uL — ABNORMAL HIGH (ref 3.87–5.11)
RDW: 15 % (ref 11.5–15.5)
WBC: 6.6 10*3/uL (ref 4.0–10.5)

## 2018-01-13 LAB — BASIC METABOLIC PANEL
BUN: 17 mg/dL (ref 6–23)
CALCIUM: 10.1 mg/dL (ref 8.4–10.5)
CO2: 28 mEq/L (ref 19–32)
Chloride: 103 mEq/L (ref 96–112)
Creatinine, Ser: 0.73 mg/dL (ref 0.40–1.20)
GFR: 85.09 mL/min (ref >60.00)
Glucose, Bld: 77 mg/dL (ref 70–99)
Potassium: 4 mEq/L (ref 3.5–5.1)
Sodium: 140 mEq/L (ref 135–145)

## 2018-01-13 LAB — LIPID PANEL
CHOLESTEROL: 218 mg/dL — AB (ref 0–200)
HDL: 83.4 mg/dL (ref >39.00)
LDL Cholesterol: 118 mg/dL — ABNORMAL HIGH (ref 0–99)
NonHDL: 135.04
TRIGLYCERIDES: 86 mg/dL (ref 0.0–149.0)
Total CHOL/HDL Ratio: 3
VLDL: 17.2 mg/dL (ref 0.0–40.0)

## 2018-01-13 LAB — HEPATIC FUNCTION PANEL
ALT: 19 U/L (ref 0–35)
AST: 19 U/L (ref 0–37)
Albumin: 4.6 g/dL (ref 3.5–5.2)
Alkaline Phosphatase: 49 U/L (ref 39–117)
BILIRUBIN DIRECT: 0.1 mg/dL (ref 0.0–0.3)
BILIRUBIN TOTAL: 0.7 mg/dL (ref 0.2–1.2)
Total Protein: 7.3 g/dL (ref 6.0–8.3)

## 2018-01-13 LAB — TSH: TSH: 1.17 u[IU]/mL (ref 0.35–4.50)

## 2018-01-13 LAB — URIC ACID: URIC ACID, SERUM: 5.9 mg/dL (ref 2.4–7.0)

## 2018-01-13 NOTE — Patient Instructions (Signed)
Plan Gi  Appt.  Shingrix vaccine. Will be contacted about  Genetic counselor appt. ( from cancer center usually)   Get Dr Patrica Duel to see your finger  . Could be post traumatic arthritis changes even if delayed .  Continue lifestyle intervention healthy eating and exercise .  Health Maintenance, Female Adopting a healthy lifestyle and getting preventive care can go a long way to promote health and wellness. Talk with your health care provider about what schedule of regular examinations is right for you. This is a good chance for you to check in with your provider about disease prevention and staying healthy. In between checkups, there are plenty of things you can do on your own. Experts have done a lot of research about which lifestyle changes and preventive measures are most likely to keep you healthy. Ask your health care provider for more information. Weight and diet Eat a healthy diet  Be sure to include plenty of vegetables, fruits, low-fat dairy products, and lean protein.  Do not eat a lot of foods high in solid fats, added sugars, or salt.  Get regular exercise. This is one of the most important things you can do for your health. ? Most adults should exercise for at least 150 minutes each week. The exercise should increase your heart rate and make you sweat (moderate-intensity exercise). ? Most adults should also do strengthening exercises at least twice a week. This is in addition to the moderate-intensity exercise.  Maintain a healthy weight  Body mass index (BMI) is a measurement that can be used to identify possible weight problems. It estimates body fat based on height and weight. Your health care provider can help determine your BMI and help you achieve or maintain a healthy weight.  For females 48 years of age and older: ? A BMI below 18.5 is considered underweight. ? A BMI of 18.5 to 24.9 is normal. ? A BMI of 25 to 29.9 is considered overweight. ? A BMI of 30 and above is  considered obese.  Watch levels of cholesterol and blood lipids  You should start having your blood tested for lipids and cholesterol at 65 years of age, then have this test every 5 years.  You may need to have your cholesterol levels checked more often if: ? Your lipid or cholesterol levels are high. ? You are older than 65 years of age. ? You are at high risk for heart disease.  Cancer screening Lung Cancer  Lung cancer screening is recommended for adults 52-76 years old who are at high risk for lung cancer because of a history of smoking.  A yearly low-dose CT scan of the lungs is recommended for people who: ? Currently smoke. ? Have quit within the past 15 years. ? Have at least a 30-pack-year history of smoking. A pack year is smoking an average of one pack of cigarettes a day for 1 year.  Yearly screening should continue until it has been 15 years since you quit.  Yearly screening should stop if you develop a health problem that would prevent you from having lung cancer treatment.  Breast Cancer  Practice breast self-awareness. This means understanding how your breasts normally appear and feel.  It also means doing regular breast self-exams. Let your health care provider know about any changes, no matter how small.  If you are in your 20s or 30s, you should have a clinical breast exam (CBE) by a health care provider every 1-3 years as part of  a regular health exam.  If you are 19 or older, have a CBE every year. Also consider having a breast X-ray (mammogram) every year.  If you have a family history of breast cancer, talk to your health care provider about genetic screening.  If you are at high risk for breast cancer, talk to your health care provider about having an MRI and a mammogram every year.  Breast cancer gene (BRCA) assessment is recommended for women who have family members with BRCA-related cancers. BRCA-related cancers  include: ? Breast. ? Ovarian. ? Tubal. ? Peritoneal cancers.  Results of the assessment will determine the need for genetic counseling and BRCA1 and BRCA2 testing.  Cervical Cancer Your health care provider may recommend that you be screened regularly for cancer of the pelvic organs (ovaries, uterus, and vagina). This screening involves a pelvic examination, including checking for microscopic changes to the surface of your cervix (Pap test). You may be encouraged to have this screening done every 3 years, beginning at age 34.  For women ages 4-65, health care providers may recommend pelvic exams and Pap testing every 3 years, or they may recommend the Pap and pelvic exam, combined with testing for human papilloma virus (HPV), every 5 years. Some types of HPV increase your risk of cervical cancer. Testing for HPV may also be done on women of any age with unclear Pap test results.  Other health care providers may not recommend any screening for nonpregnant women who are considered low risk for pelvic cancer and who do not have symptoms. Ask your health care provider if a screening pelvic exam is right for you.  If you have had past treatment for cervical cancer or a condition that could lead to cancer, you need Pap tests and screening for cancer for at least 20 years after your treatment. If Pap tests have been discontinued, your risk factors (such as having a new sexual partner) need to be reassessed to determine if screening should resume. Some women have medical problems that increase the chance of getting cervical cancer. In these cases, your health care provider may recommend more frequent screening and Pap tests.  Colorectal Cancer  This type of cancer can be detected and often prevented.  Routine colorectal cancer screening usually begins at 65 years of age and continues through 65 years of age.  Your health care provider may recommend screening at an earlier age if you have risk factors  for colon cancer.  Your health care provider may also recommend using home test kits to check for hidden blood in the stool.  A small camera at the end of a tube can be used to examine your colon directly (sigmoidoscopy or colonoscopy). This is done to check for the earliest forms of colorectal cancer.  Routine screening usually begins at age 37.  Direct examination of the colon should be repeated every 5-10 years through 65 years of age. However, you may need to be screened more often if early forms of precancerous polyps or small growths are found.  Skin Cancer  Check your skin from head to toe regularly.  Tell your health care provider about any new moles or changes in moles, especially if there is a change in a mole's shape or color.  Also tell your health care provider if you have a mole that is larger than the size of a pencil eraser.  Always use sunscreen. Apply sunscreen liberally and repeatedly throughout the day.  Protect yourself by wearing long sleeves,  pants, a wide-brimmed hat, and sunglasses whenever you are outside.  Heart disease, diabetes, and high blood pressure  High blood pressure causes heart disease and increases the risk of stroke. High blood pressure is more likely to develop in: ? People who have blood pressure in the high end of the normal range (130-139/85-89 mm Hg). ? People who are overweight or obese. ? People who are African American.  If you are 18-39 years of age, have your blood pressure checked every 3-5 years. If you are 40 years of age or older, have your blood pressure checked every year. You should have your blood pressure measured twice-once when you are at a hospital or clinic, and once when you are not at a hospital or clinic. Record the average of the two measurements. To check your blood pressure when you are not at a hospital or clinic, you can use: ? An automated blood pressure machine at a pharmacy. ? A home blood pressure monitor.  If  you are between 55 years and 79 years old, ask your health care provider if you should take aspirin to prevent strokes.  Have regular diabetes screenings. This involves taking a blood sample to check your fasting blood sugar level. ? If you are at a normal weight and have a low risk for diabetes, have this test once every three years after 65 years of age. ? If you are overweight and have a high risk for diabetes, consider being tested at a younger age or more often. Preventing infection Hepatitis B  If you have a higher risk for hepatitis B, you should be screened for this virus. You are considered at high risk for hepatitis B if: ? You were born in a country where hepatitis B is common. Ask your health care provider which countries are considered high risk. ? Your parents were born in a high-risk country, and you have not been immunized against hepatitis B (hepatitis B vaccine). ? You have HIV or AIDS. ? You use needles to inject street drugs. ? You live with someone who has hepatitis B. ? You have had sex with someone who has hepatitis B. ? You get hemodialysis treatment. ? You take certain medicines for conditions, including cancer, organ transplantation, and autoimmune conditions.  Hepatitis C  Blood testing is recommended for: ? Everyone born from 1945 through 1965. ? Anyone with known risk factors for hepatitis C.  Sexually transmitted infections (STIs)  You should be screened for sexually transmitted infections (STIs) including gonorrhea and chlamydia if: ? You are sexually active and are younger than 65 years of age. ? You are older than 65 years of age and your health care provider tells you that you are at risk for this type of infection. ? Your sexual activity has changed since you were last screened and you are at an increased risk for chlamydia or gonorrhea. Ask your health care provider if you are at risk.  If you do not have HIV, but are at risk, it may be recommended  that you take a prescription medicine daily to prevent HIV infection. This is called pre-exposure prophylaxis (PrEP). You are considered at risk if: ? You are sexually active and do not regularly use condoms or know the HIV status of your partner(s). ? You take drugs by injection. ? You are sexually active with a partner who has HIV.  Talk with your health care provider about whether you are at high risk of being infected with HIV. If you   choose to begin PrEP, you should first be tested for HIV. You should then be tested every 3 months for as long as you are taking PrEP. Pregnancy  If you are premenopausal and you may become pregnant, ask your health care provider about preconception counseling.  If you may become pregnant, take 400 to 800 micrograms (mcg) of folic acid every day.  If you want to prevent pregnancy, talk to your health care provider about birth control (contraception). Osteoporosis and menopause  Osteoporosis is a disease in which the bones lose minerals and strength with aging. This can result in serious bone fractures. Your risk for osteoporosis can be identified using a bone density scan.  If you are 65 years of age or older, or if you are at risk for osteoporosis and fractures, ask your health care provider if you should be screened.  Ask your health care provider whether you should take a calcium or vitamin D supplement to lower your risk for osteoporosis.  Menopause may have certain physical symptoms and risks.  Hormone replacement therapy may reduce some of these symptoms and risks. Talk to your health care provider about whether hormone replacement therapy is right for you. Follow these instructions at home:  Schedule regular health, dental, and eye exams.  Stay current with your immunizations.  Do not use any tobacco products including cigarettes, chewing tobacco, or electronic cigarettes.  If you are pregnant, do not drink alcohol.  If you are  breastfeeding, limit how much and how often you drink alcohol.  Limit alcohol intake to no more than 1 drink per day for nonpregnant women. One drink equals 12 ounces of beer, 5 ounces of wine, or 1 ounces of hard liquor.  Do not use street drugs.  Do not share needles.  Ask your health care provider for help if you need support or information about quitting drugs.  Tell your health care provider if you often feel depressed.  Tell your health care provider if you have ever been abused or do not feel safe at home. This information is not intended to replace advice given to you by your health care provider. Make sure you discuss any questions you have with your health care provider. Document Released: 12/22/2010 Document Revised: 11/14/2015 Document Reviewed: 03/12/2015 Elsevier Interactive Patient Education  [employer].

## 2018-01-21 ENCOUNTER — Encounter (INDEPENDENT_AMBULATORY_CARE_PROVIDER_SITE_OTHER): Payer: Self-pay

## 2018-01-24 ENCOUNTER — Encounter: Payer: Self-pay | Admitting: Licensed Clinical Social Worker

## 2018-01-24 ENCOUNTER — Telehealth: Payer: Self-pay | Admitting: Licensed Clinical Social Worker

## 2018-01-24 NOTE — Telephone Encounter (Signed)
A genetic counseling appointment has been scheduled for the pt to see Epimenio Foot in 8/22 at 1pm. Pt aware to arrive 30 minutes early. Letter mailed.

## 2018-02-02 ENCOUNTER — Encounter: Payer: Self-pay | Admitting: Gastroenterology

## 2018-02-09 ENCOUNTER — Ambulatory Visit (INDEPENDENT_AMBULATORY_CARE_PROVIDER_SITE_OTHER): Payer: BLUE CROSS/BLUE SHIELD | Admitting: *Deleted

## 2018-02-09 DIAGNOSIS — Z23 Encounter for immunization: Secondary | ICD-10-CM | POA: Diagnosis not present

## 2018-02-09 NOTE — Progress Notes (Signed)
Per orders of Dr. Regis Bill, injection of Shingrix given by Dorrene German. Patient tolerated injection well.  Patient was scheduled for second dose of Shingrix, but per chart has never had Shingrix before. Verified with patient that she has not had Shingrix, but did have Zostavax several years ago, which is documented in chart. Explained to patient that Shingrix is a different vaccine that is supposed to be more effective and that she will need second dose of Shingrix in 2-6 months. Patient verbalized understanding of instructions.

## 2018-02-10 ENCOUNTER — Encounter: Payer: Self-pay | Admitting: Licensed Clinical Social Worker

## 2018-02-10 ENCOUNTER — Inpatient Hospital Stay: Payer: BLUE CROSS/BLUE SHIELD | Attending: Genetic Counselor | Admitting: Licensed Clinical Social Worker

## 2018-02-10 ENCOUNTER — Inpatient Hospital Stay: Payer: BLUE CROSS/BLUE SHIELD

## 2018-02-10 DIAGNOSIS — Z803 Family history of malignant neoplasm of breast: Secondary | ICD-10-CM

## 2018-02-10 DIAGNOSIS — Z800 Family history of malignant neoplasm of digestive organs: Secondary | ICD-10-CM

## 2018-02-10 DIAGNOSIS — Z808 Family history of malignant neoplasm of other organs or systems: Secondary | ICD-10-CM

## 2018-02-10 DIAGNOSIS — Z122 Encounter for screening for malignant neoplasm of respiratory organs: Secondary | ICD-10-CM

## 2018-02-10 NOTE — Progress Notes (Signed)
REFERRING PROVIDER: Burnis Medin, MD [place], [place] [address]  PRIMARY PROVIDER:  Burnis Medin, MD  PRIMARY REASON FOR VISIT:  1. Family history of pancreatic cancer   2. Family history of breast cancer   3. Family history of melanoma      HISTORY OF PRESENT ILLNESS:   Heather Goodwin, a 67 y.o. female, was seen for a [facility] cancer genetics consultation at the request of Dr. Regis Bill due to a family history of cancer.  Heather Goodwin presents to clinic today to discuss the possibility of a hereditary predisposition to cancer, genetic testing, and to further clarify her future cancer risks, as well as potential cancer risks for family members.     Heather Goodwin is a 67 y.o. female with no personal history of cancer.    CANCER HISTORY:   No history exists.     HORMONAL RISK FACTORS:  Menarche was at age 82.  First live birth at age 69.  OCP use for approximately 3 years.  Ovaries intact: yes.  Hysterectomy: no.  Menopausal status: postmenopausal. Menopause at age 37. HRT use: 0 years. Colonoscopy: yes; normal. Mammogram within the last year: yes. Number of breast biopsies: 0.  Past Medical History:  Diagnosis Date  . Beta thalassemia trait   . Diarrhea 03/02/2011   Episodic watery usually . Explosive? And  Loss of control   . Family history of breast cancer   . Family history of melanoma   . Family history of pancreatic cancer   . Foot fracture    at gym    Past Surgical History:  Procedure Laterality Date  . dexa     normal at gyn  . left knee surgery  2009   meniscus   . US ECHOCARDIOGRAPHY  2005   normal for syncope     Social History   Socioeconomic History  . Marital status: Married    Spouse name: Not on file  . Number of children: 3  . Years of education: Not on file  . Highest education level: Not on file  Occupational History  . Occupation: Real [employer]  . Financial resource strain: Not on file  . Food insecurity:     Worry: Not on file    Inability: Not on file  . Transportation needs:    Medical: Not on file    Non-medical: Not on file  Tobacco Use  . Smoking status: Never Smoker  . Smokeless tobacco: Never Used  Substance and Sexual Activity  . Alcohol use: Yes    Alcohol/week: 1.0 - 4.0 standard drinks    Types: 1 - 4 Glasses of wine per week    Comment: occ  . Drug use: No  . Sexual activity: Not on file  Lifestyle  . Physical activity:    Days per week: Not on file    Minutes per session: Not on file  . Stress: Not on file  Relationships  . Social connections:    Talks on phone: Not on file    Gets together: Not on file    Attends religious service: Not on file    Active member of club or organization: Not on file    Attends meetings of clubs or organizations: Not on file    Relationship status: Not on file  Other Topics Concern  . 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           FAMILY HISTORY:  We obtained a detailed, 4-generation family history.  Significant diagnoses are listed below: Family History  Problem Relation Age of Onset  . Thyroid disease Mother        partial thyroidectomy  . Pancreatic cancer Mother 70  . Cirrhosis Father        secondary to chronic hepatitis b infection  . Other Son        B Thal trait  . Breast cancer Paternal Aunt 53  . Breast cancer Maternal Aunt 80  . Pancreatic cancer Maternal Grandfather        dx 75-80  . Melanoma Brother 12  . Colon cancer Neg Hx    Heather Goodwin has three children, a daughter age 27 and two sons ages 8 and 36. Her 34 year old son has a daughter and a son, and her 55 year old son has a daughter. Heather Goodwin has two brothers. Her 57 year old brother was recently diagnosed with melanoma, but she does not have details about this. He does not have children. Her 107 year old  brother has a daughter and a son.  Heather Goodwin father died of cirrhosis of the liver at 66. He had a sister, diagnosed with breast cancer at 58 and died at 72. She has four children, no cancer history. Heather Goodwin had a paternal uncle as well who died in his 3's in 57 War II.  Heather Goodwin paternal grandfather died at 39 of a heart attack, and her paternal grandmother died at 61 of dementia.  Heather Goodwin mother was diagnosed with pancreatic cancer at 40 and died at 99. She also had issues with her thyroid and had it removed. Heather Goodwin has four maternal aunts and 3 maternal uncles. Two of her aunts are twins, and one of the twins had breast cancer diagnosed at 92 and died at 44. The other twin died of dementia. Two of her aunts are living and an uncle is living. No cancer history in her maternal first cousins. She does have a second cousin (her grandfather's sister's daughter) diagnosed with breast cancer at 1 and died at 5. Ms. Canan maternal grandmother died at 4, and her maternal grandfather had pancreatic cancer between 107-80 and died shortly after diagnosis.   Heather Goodwin is unaware of previous family history of genetic testing for hereditary cancer risks. Patient's maternal ancestors are of [place] descent, and paternal ancestors are of German/Irish/Scottish descent. There is no reported Ashkenazi Jewish ancestry. There is no known consanguinity.  GENETIC COUNSELING ASSESSMENT: Heather Goodwin is a 67 y.o. female with a family history which is somewhat suggestive of a Hereditary Cancer Predisposition Syndrome. We, therefore, discussed and recommended the following at today's visit.   DISCUSSION: We discussed that about 5-10% of cancer cases are hereditary. We reviewed the characteristics, features and inheritance patterns of hereditary cancer syndromes. We also discussed genetic testing, including the appropriate family members to test, the process of testing, insurance coverage and  turn-around-time for results. We discussed the implications of a negative, positive and/or variant of uncertain significant result. We recommended (patient) pursue genetic testing for the Common Hereditary Cancers Panel + Pancreatic Panel.  The [facility] Common Hereditary Cancer Panel + Pancreatic Cancer Panel (49 genes) analyzes the the following genes were evaluated for sequence changes and exonic deletions/duplications: APC, ATM, AXIN2, BARD1, BMPR1A, BRCA1, BRCA2, BRIP1, CDH1, CDK4, CDKN2A (p14ARF), CDKN2A (p16INK4a),  CHEK2, CTNNA1, DICER1, EPCAM*, FANCC, GREM1*, KIT, MEN1, MLH1, MSH2, MSH3, MSH6, MUTYH, NBN, NF1, PALB2, PALLD, PDGFRA, PMS2, POLD1, POLE, PTEN, RAD50, RAD51C, RAD51D, SDHB, SDHC, SDHD, SMAD4, SMARCA4, STK11, TP53, TSC1, TSC2, VHL. The following genes were evaluated for sequence changes only: HOXB13*, NTHL1*, SDHA  We discussed that if she is found to have a mutation in one of these genes, it may impact future medical management recommendations such as increased cancer screenings and consideration of risk reducing surgeries.  A positive result could also have implications for the patient's family members.  A Negative result would mean we were unable to identify a hereditary component to her family history of cancer, but does not rule out the possibility of a hereditary basis for her family history of cancer.  There could be mutations that are undetectable by current technology, or in genes not yet tested or identified to increase cancer risk.    We discussed the potential to find a Variant of Uncertain Significance or VUS.  These are variants that have not yet been identified as pathogenic or benign, and it is unknown if this variant is associated with increased cancer risk or if this is a normal finding.  Most VUS's are reclassified to benign or likely benign.   It should not be used to make medical management decisions. With time, we suspect the lab will determine the significance of any  VUS's identified if any.   Based on Heather Goodwin's personal history of cancer, she meets medical criteria for genetic testing. Despite that shemeets criteria, she may still have an out of pocket cost.  PLAN: Despite our recommendation, Heather Goodwin did not wish to pursue genetic testing at today's visit. We understand this decision, and remain available to coordinate genetic testing at any time in the future. We therefore recommend Heather Goodwin continue to follow the cancer screening guidelines given by her primary healthcare provider.  Based on Heather Goodwin's family history, we recommended her other siblings and maternal relatives have genetic counseling and testing. Heather Goodwin will let us know if we can be of any assistance in coordinating genetic counseling and/or testing for this family member.   Lastly, we encouraged Heather Goodwin to remain in contact with cancer genetics annually so that we can continuously update the family history and inform her of any changes in cancer genetics and testing that may be of benefit for this family.   Ms.  Surprenant questions were answered to her satisfaction today. Our contact information was provided should additional questions or concerns arise. Thank you for the referral and allowing [place] to share in the care of your patient.   Epimenio Foot, MS Genetic Counselor [email or web address] Phone: [phone]  The patient was seen for a total of 40 minutes in face-to-face genetic counseling.

## 2018-04-07 ENCOUNTER — Encounter: Payer: Self-pay | Admitting: Gastroenterology

## 2018-04-11 ENCOUNTER — Ambulatory Visit: Payer: BLUE CROSS/BLUE SHIELD | Admitting: Gastroenterology

## 2018-04-19 DIAGNOSIS — R69 Illness, unspecified: Secondary | ICD-10-CM | POA: Diagnosis not present

## 2018-04-25 ENCOUNTER — Other Ambulatory Visit: Payer: Self-pay | Admitting: Internal Medicine

## 2018-04-25 DIAGNOSIS — Z1231 Encounter for screening mammogram for malignant neoplasm of breast: Secondary | ICD-10-CM

## 2018-05-06 ENCOUNTER — Other Ambulatory Visit (INDEPENDENT_AMBULATORY_CARE_PROVIDER_SITE_OTHER): Payer: Medicare HMO

## 2018-05-06 ENCOUNTER — Encounter: Payer: Self-pay | Admitting: Gastroenterology

## 2018-05-06 ENCOUNTER — Ambulatory Visit (INDEPENDENT_AMBULATORY_CARE_PROVIDER_SITE_OTHER): Payer: Medicare HMO | Admitting: Gastroenterology

## 2018-05-06 VITALS — BP 100/60 | HR 72 | Ht 67.0 in | Wt 152.0 lb

## 2018-05-06 DIAGNOSIS — R197 Diarrhea, unspecified: Secondary | ICD-10-CM | POA: Diagnosis not present

## 2018-05-06 DIAGNOSIS — R159 Full incontinence of feces: Secondary | ICD-10-CM

## 2018-05-06 LAB — IGA: IGA: 276 mg/dL (ref 68–378)

## 2018-05-06 MED ORDER — COLESTIPOL HCL 1 G PO TABS: 1.0000 g | ORAL_TABLET | Freq: Two times a day (BID) | ORAL | 1 refills | Status: DC

## 2018-05-06 NOTE — Progress Notes (Signed)
Heather Goodwin    [id number]    [birth year]  Primary Care Physician:Panosh, Standley Brooking, MD  Referring Physician: Burnis Medin, MD [place], [place] [address]  Chief complaint: Fecal incontinence HPI: 67 year old female here to reestablish care with complaints of intermittent fecal incontinence.  She was last seen in September 2012 by Nicoletta Ba. She had an episode of fecal incontinence 6-7 years ago and again she had one this past summer where she had loose stool run down her pants while she was walking on a trail.  On most days she is able to control, has been doing Kegel exercises for past few years.  She has 3-4 semi-formed to loose bowel movements daily with fecal urgency.  She drinks protein shakes, fair life milk and also eats high-fiber diet. Denies any fecal incontinence during sleep or incontinence with formed stool.  No abdominal pain, nausea, vomiting, loss of appetite, weight loss, melena or blood in stool.  Normal colonoscopy in 2006 Colonoscopy 2012 with hyperplastic rectal polyp and internal hemorrhoids otherwise normal exam  Mother and maternal grandparent with history of pancreatic cancer in their 21s     No outpatient encounter medications on file as of 05/06/2018.   No facility-administered encounter medications on file as of 05/06/2018.     Allergies as of 05/06/2018  . (No Known Allergies)    Past Medical History:  Diagnosis Date  . Beta thalassemia trait   . Foot fracture    at gym    Past Surgical History:  Procedure Laterality Date  . left knee surgery  2009   meniscus   . US ECHOCARDIOGRAPHY  2005   normal for syncope     Family History  Problem Relation Age of Onset  . Thyroid disease Mother        partial thyroidectomy  . Pancreatic cancer Mother 10  . Cirrhosis Father        secondary to chronic hepatitis b infection  . Other Son        B Thal trait  . Breast cancer Paternal Aunt 43  . Breast  cancer Maternal Aunt 80  . Pancreatic cancer Maternal Grandfather        dx 75-80  . Melanoma Brother 38  . Colon cancer Neg Hx   . Esophageal cancer Neg Hx   . Rectal cancer Neg Hx     Social History   Socioeconomic History  . Marital status: Married    Spouse name: Not on file  . Number of children: 3  . Years of education: Not on file  . Highest education level: Not on file  Occupational History  . Occupation: Real [employer]  . Financial resource strain: Not on file  . Food insecurity:    Worry: Not on file    Inability: Not on file  . Transportation needs:    Medical: Not on file    Non-medical: Not on file  Tobacco Use  . Smoking status: Never Smoker  . Smokeless tobacco: Never Used  Substance and Sexual Activity  . Alcohol use: Yes    Alcohol/week: 1.0 - 4.0 standard drinks    Types: 1 - 4 Glasses of wine per week    Comment: occ  . Drug use: No  . Sexual activity: Not on file  Lifestyle  . Physical activity:    Days per week: Not on file    Minutes per session:  Not on file  . Stress: Not on file  Relationships  . Social connections:    Talks on phone: Not on file    Gets together: Not on file    Attends religious service: Not on file    Active member of club or organization: Not on file    Attends meetings of clubs or organizations: Not on file    Relationship status: Not on file  . Intimate partner violence:    Fear of current or ex partner: Not on file    Emotionally abused: Not on file    Physically abused: Not on file    Forced sexual activity: Not on file  Other Topics Concern  . Not on file  Social History Narrative   Married   Regular exercise- yes   Real estate-Management rentalproperties   3 children   2-3 caffeine drinks daily   Patient is right handed.            Review of systems: Review of Systems  Constitutional: Negative for fever and chills.  HENT: Negative.   Eyes: Negative for blurred vision.  Respiratory:  Negative for cough, shortness of breath and wheezing.   Cardiovascular: Negative for chest pain and palpitations.  Gastrointestinal: as per HPI Genitourinary: Negative for dysuria, urgency, frequency and hematuria.  Musculoskeletal: Negative for myalgias, back pain and joint pain.  Skin: Negative for itching and rash.  Neurological: Negative for dizziness, tremors, focal weakness, seizures and loss of consciousness.  Endo/Heme/Allergies: Negative for seasonal allergies.  Psychiatric/Behavioral: Negative for depression, suicidal ideas and hallucinations.  All other systems reviewed and are negative.   Physical Exam: Vitals:   05/06/18 0835  BP: 100/60  Pulse: 72   Body mass index is 23.81 kg/m. Gen:      No acute distress HEENT:  EOMI, sclera anicteric Neck:     No masses; no thyromegaly Lungs:    Clear to auscultation bilaterally; normal respiratory effort CV:         Regular rate and rhythm; no murmurs Abd:      + bowel sounds; soft, non-tender; no palpable masses, no distension Ext:    No edema; adequate peripheral perfusion Skin:      Warm and dry; no rash Neuro: alert and oriented x 3 Psych: normal mood and affect Rectal exam: Decreased anal sphincter tone, no anal fissure or external hemorrhoids.  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  We will consider anorectal manometry after physical therapy if continues to have persistent symptoms to further evaluate  Trial of  lactose-free diet for 2 weeks Start Benefiber 1 teaspoon 3 times daily  Colestid 1 g twice daily for possible bile salt induced diarrhea   Will check TTG IgA antibody to exclude celiac disease, also check for fecal fat and pancreatic elastase for pancreatic insufficiency.  Fecal lactoferrin and GI pathogen panel If above work-up negative and continues to have persistent diarrhea, will have to consider colonoscopy with biopsies to exclude microscopic colitis  Return in 1 month or sooner if needed  Greater than 50% of  the time used for counseling as well as treatment plan and follow-up. She had multiple questions which were answered to her satisfaction  K. Denzil Magnuson , MD [phone]    CC: Panosh, Standley Brooking, MD

## 2018-05-06 NOTE — Patient Instructions (Addendum)
Go to the basement for labs today and stool studies   Start Benefiber 1 teaspoon three times a day  We will send Colestid to your pharmacy  You have been referred to [facility] for Physical therapy, they will contact you with that appointment  Bring Diet Symptom sheets with you to your next appointment  Lactose-Free Diet, Adult x 2 weeks If you have lactose intolerance, you are not able to digest lactose. Lactose is a natural sugar found mainly in milk and milk products. You may need to avoid all foods and beverages that contain lactose. A lactose-free diet can help you do this. What do I need to know about this diet?  Do not consume foods, beverages, vitamins, minerals, or medicines with lactose. Read ingredients lists carefully.  Look for the words "lactose-free" on labels.  Use lactase enzyme drops or tablets as directed by your health care provider.  Use lactose-free milk or a milk alternative, such as soy milk, for drinking and cooking.  Make sure you get enough calcium and vitamin D in your diet. A lactose-free eating plan can be lacking in these important nutrients.  Take calcium and vitamin D supplements as directed by your health care provider. Talk to your provider about supplements if you are not able to get enough calcium and vitamin D from food. Which foods have lactose? Lactose is found in:  Milk and foods made from milk.  Yogurt.  Cheese.  Butter.  Margarine.  Sour cream.  Cream.  Whipped toppings and nondairy creamers.  Ice cream and other milk-based desserts.  Lactose is also found in foods or products made with milk or milk ingredients. To find out whether a food contains milk or a milk ingredient, look at the ingredients list. Avoid foods with the statement "May contain milk" and foods that contain:  Butter.  Cream.  Milk.  Milk solids.  Milk powder.  Whey.  Curd.  Caseinate.  Lactose.  Lactalbumin.  Lactoglobulin.  What are  some alternatives to milk and foods made with milk products?  Lactose-free milk.  Soy milk with added calcium and vitamin D.  Almond, coconut, or rice milk with added calcium and vitamin D. Note that these are low in protein.  Soy products, such as soy yogurt, soy cheese, soy ice cream, and soy-based sour cream. Which foods can I eat? Grains Breads and rolls made without milk, such as [place], [place], or [place] bread, bagels, pita, and [employer]. Corn tortillas, corn meal, grits, and polenta. Crackers without lactose or milk solids, such as soda crackers and graham crackers. Cooked or dry cereals without lactose or milk solids. Pasta, quinoa, couscous, barley, oats, bulgur, farro, rice, wild rice, or other grains prepared without milk or lactose. Plain popcorn. Vegetables Fresh, frozen, and canned vegetables without cheese, cream, or butter sauces. Fruits All fresh, canned, frozen, or dried fruits that are not processed with lactose. Meats and Other Protein Sources Plain beef, chicken, fish, [place], lamb, veal, pork, wild game, or ham. Kosher-prepared meat products. Strained or junior meats that do not contain milk. Eggs. Soy meat substitutes. Beans, lentils, and hummus. Tofu. Nuts and seeds. Peanut or other nut butters without lactose. Soups, casseroles, and mixed dishes without cheese, cream, or milk. Dairy Lactose-free milk. Soy, rice, or almond milk with added calcium and vitamin D. Soy cheese and yogurt. Beverages Carbonated drinks. Tea. Coffee, freeze-dried coffee, and some instant coffees. Fruit and vegetable juices. Condiments Soy sauce. Carob powder. Olives. Gravy made with water. Baker's cocoa.  Angie Fava. Pure seasonings and spices. Ketchup. Mustard. Bouillon. Broth. Sweets and Desserts Water and fruit ices. Gelatin. Cookies, pies, or cakes made from allowed ingredients, such as angel food cake. Pudding made with water or a milk substitute. Lactose-free tofu desserts. Soy, coconut milk,  or rice-milk-based frozen desserts. Sugar. Honey. Jam, jelly, and marmalade. Molasses. Pure sugar candy. Dark chocolate without milk. Marshmallows. Fats and Oils Margarines and salad dressings that do not contain milk. Berniece Salines. Vegetable oils. Shortening. Mayonnaise. Soy or coconut-based cream. The items listed above may not be a complete list of recommended foods or beverages. Contact your dietitian for more options. Which foods are not recommended? Grains Breads and rolls that contain milk. Toaster pastries. Muffins, biscuits, waffles, cornbread, and pancakes. These can be prepared at home, commercial, or from mixes. Sweet rolls, donuts, English muffins, fry bread, lefse, flour tortillas with lactose, or [place] toast made with milk or milk ingredients. Crackers that contain lactose. Corn curls. Cooked or dry cereals with lactose. Vegetables Creamed or breaded vegetables. Vegetables in a cheese or butter sauce or with lactose-containing margarines. Instant potatoes. [place] fries. Scalloped or au gratin potatoes. Fruits None. Meats and Other Protein Sources Scrambled eggs, omelets, and souffles that contain milk. Creamed or breaded meat, fish, chicken, or [place]. Sausage products, such as wieners and liver sausage. Cold cuts that contain milk solids. Cheese, cottage cheese, ricotta cheese, and cheese spreads. Lasagna and macaroni and cheese. Pizza. Peanut or other nut butters with added milk solids. Casseroles or mixed dishes containing milk or cheese. Dairy All dairy products, including milk, goat's milk, buttermilk, kefir, acidophilus milk, flavored milk, evaporated milk, condensed milk, dulce de [place], eggnog, yogurt, cheese, and cheese spreads. Beverages Hot chocolate. Cocoa with lactose. Instant iced teas. Powdered fruit drinks. Smoothies made with milk or yogurt. Condiments Chewing gum that has lactose. Cocoa that has lactose. Spice blends if they contain milk products. Artificial sweeteners  that contain lactose. Nondairy creamers. Sweets and Desserts Ice cream, ice milk, gelato, sherbet, and frozen yogurt. Custard, pudding, and mousse. Cake, cream pies, cookies, and other desserts containing milk, cream, cream cheese, or milk chocolate. Pie crust made with milk-containing margarine or butter. Reduced-calorie desserts made with a sugar substitute that contains lactose. Toffee and butterscotch. Milk, white, or dark chocolate that contains milk. Fudge. Caramel. Fats and Oils Margarines and salad dressings that contain milk or cheese. Cream. Half and half. Cream cheese. Sour cream. Chip dips made with sour cream or yogurt. The items listed above may not be a complete list of foods and beverages to avoid. Contact your dietitian for more information. Am I getting enough calcium? Calcium is found in many foods that contain lactose and is important for bone health. The amount of calcium you need depends on your age:  Adults younger than 50 years: 1000 mg of calcium a day.  Adults older than 50 years: 1200 mg of calcium a day.  If you are not getting enough calcium, other calcium sources include:  Orange juice with calcium added. There are 300-350 mg of calcium in 1 cup of orange juice.  Sardines with edible bones. There are 325 mg of calcium in 3 oz of sardines.  Calcium-fortified soy milk. There are 300-400 mg of calcium in 1 cup of calcium-fortified soy milk.  Calcium-fortified rice or almond milk. There are 300 mg of calcium in 1 cup of calcium-fortified rice or almond milk.  Canned salmon with edible bones. There are 180 mg of calcium in 3 oz of canned salmon  with edible bones.  Calcium-fortified breakfast cereals. There are [phone] mg of calcium in calcium-fortified breakfast cereals.  Tofu set with calcium sulfate. There are 250 mg of calcium in  cup of tofu set with calcium sulfate.  Spinach, cooked. There are 145 mg of calcium in  cup of cooked spinach.  Edamame,  cooked. There are 130 mg of calcium in  cup of cooked edamame.  Collard greens, cooked. There are 125 mg of calcium in  cup of cooked collard greens.  Kale, frozen or cooked. There are 90 mg of calcium in  cup of cooked or frozen kale.  Almonds. There are 95 mg of calcium in  cup of almonds.  Broccoli, cooked. There are 60 mg of calcium in 1 cup of cooked broccoli.  This information is not intended to replace advice given to you by your health care provider. Make sure you discuss any questions you have with your health care provider. Document Released: 11/28/2001 Document Revised: 11/14/2015 Document Reviewed: 09/08/2013 Elsevier Interactive Patient Education  2018 [employer].

## 2018-05-09 ENCOUNTER — Other Ambulatory Visit: Payer: Medicare HMO

## 2018-05-09 DIAGNOSIS — R159 Full incontinence of feces: Secondary | ICD-10-CM

## 2018-05-09 DIAGNOSIS — R197 Diarrhea, unspecified: Secondary | ICD-10-CM | POA: Diagnosis not present

## 2018-05-09 LAB — TISSUE TRANSGLUTAMINASE, IGA: (TTG) AB, IGA: 1 U/mL

## 2018-05-10 ENCOUNTER — Telehealth: Payer: Self-pay | Admitting: Physical Therapy

## 2018-05-11 DIAGNOSIS — Z124 Encounter for screening for malignant neoplasm of cervix: Secondary | ICD-10-CM | POA: Diagnosis not present

## 2018-05-12 ENCOUNTER — Encounter: Payer: Self-pay | Admitting: Gastroenterology

## 2018-05-12 LAB — FECAL FAT, QUALITATIVE
Fat Qual Neutral, Stl: NORMAL
Fat Qual Total, Stl: NORMAL

## 2018-05-14 LAB — GASTROINTESTINAL PATHOGEN PANEL PCR
C. difficile Tox A/B, PCR: NOT DETECTED
CRYPTOSPORIDIUM, PCR: NOT DETECTED
Campylobacter, PCR: NOT DETECTED
E COLI (STEC) STX1/STX2, PCR: NOT DETECTED
E coli (ETEC) LT/ST PCR: NOT DETECTED
E coli 0157, PCR: NOT DETECTED
GIARDIA LAMBLIA, PCR: NOT DETECTED
NOROVIRUS, PCR: NOT DETECTED
ROTAVIRUS, PCR: NOT DETECTED
Salmonella, PCR: NOT DETECTED
Shigella, PCR: NOT DETECTED

## 2018-05-14 LAB — FECAL LACTOFERRIN, QUANT
FECAL LACTOFERRIN: NEGATIVE
MICRO NUMBER:: 91386095
SPECIMEN QUALITY:: ADEQUATE

## 2018-05-14 LAB — PANCREATIC ELASTASE, FECAL: Pancreatic Elastase-1, Stool: >500 mcg/g

## 2018-05-30 ENCOUNTER — Other Ambulatory Visit: Payer: Self-pay | Admitting: Obstetrics & Gynecology

## 2018-05-30 ENCOUNTER — Ambulatory Visit: Payer: Medicare HMO | Attending: Gastroenterology | Admitting: Physical Therapy

## 2018-05-30 DIAGNOSIS — R5381 Other malaise: Secondary | ICD-10-CM

## 2018-06-08 ENCOUNTER — Ambulatory Visit
Admission: RE | Admit: 2018-06-08 | Discharge: 2018-06-08 | Disposition: A | Discharge status: Home / Self Care | Payer: Medicare HMO | Source: Ambulatory Visit | Attending: Internal Medicine | Admitting: Internal Medicine

## 2018-06-08 DIAGNOSIS — Z1231 Encounter for screening mammogram for malignant neoplasm of breast: Secondary | ICD-10-CM | POA: Diagnosis not present

## 2018-06-13 ENCOUNTER — Other Ambulatory Visit: Payer: Self-pay | Admitting: Obstetrics & Gynecology

## 2018-06-13 DIAGNOSIS — E2839 Other primary ovarian failure: Secondary | ICD-10-CM

## 2018-06-16 ENCOUNTER — Ambulatory Visit: Payer: Medicare HMO | Admitting: Gastroenterology

## 2018-06-23 ENCOUNTER — Other Ambulatory Visit: Payer: Self-pay

## 2018-06-23 ENCOUNTER — Ambulatory Visit: Payer: Medicare HMO | Attending: Internal Medicine | Admitting: Physical Therapy

## 2018-06-23 ENCOUNTER — Encounter: Payer: Self-pay | Admitting: Physical Therapy

## 2018-06-23 DIAGNOSIS — M6281 Muscle weakness (generalized): Secondary | ICD-10-CM | POA: Diagnosis not present

## 2018-06-23 DIAGNOSIS — R159 Full incontinence of feces: Secondary | ICD-10-CM

## 2018-06-23 NOTE — Therapy (Signed)
[facility] [address] [place], [place], [address] Phone: [phone]   Fax:  [phone]  Physical Therapy Evaluation  Patient Details  Name: Heather Goodwin MRN: [id number] Date of Birth: [birth year] Referring Provider (PT): Dr. Harl Bowie   Encounter Date: 06/23/2018  PT End of Session - 06/23/18 0927    Visit Number  1    Date for PT Re-Evaluation  08/18/18    Authorization Type  [employer] Medicare    PT Start Time  0845    PT Stop Time  0925    PT Time Calculation (min)  40 min    Activity Tolerance  Patient tolerated treatment well    Behavior During Therapy  [facility] for tasks assessed/performed       Past Medical History:  Diagnosis Date  . Beta thalassemia trait   . Foot fracture    at gym    Past Surgical History:  Procedure Laterality Date  . left knee surgery  2009   meniscus   . US ECHOCARDIOGRAPHY  2005   normal for syncope     There were no vitals filed for this visit.   Subjective Assessment - 06/23/18 0850    Subjective  10 years ago had fecal incontinence. This past year she has another episode. Patient is now on Benefiber  and Colestid. which is helping. Patient wasa having urgency. Stool is fully formed. Now patient has 1-2 bowel movement per day. Patient does not have to strain to have a bowel movement. Patient has to wipe several times after bowel movement.     Patient Stated Goals  Strengthen pelvic floor movement    Currently in Pain?  No/denies    Multiple Pain Sites  No         [facility] PT Assessment - 06/23/18 0001      Assessment   Medical Diagnosis  R15.9 Incontinence of feces, unspecified fecal incontinencetype; R19.7 Diarrhea, unspedified    Referring Provider (PT)  Dr. Harl Bowie    Onset Date/Surgical Date  11/20/17    Prior Therapy  none      Precautions   Precautions  None      Restrictions   Weight Bearing Restrictions  No      Balance Screen   Has  the patient fallen in the past 6 months  No    Has the patient had a decrease in activity level because of a fear of falling?   No    Is the patient reluctant to leave their home because of a fear of falling?   No      Home Film/video editor residence      Prior Function   Level of Independence  Independent    Vocation  Full time employment    Leisure  goes to the gym 1 time per day 5 days per week      Cognition   Overall Cognitive Status  Within Functional Limits for tasks assessed      Posture/Postural Control   Posture/Postural Control  No significant limitations      ROM / Strength   AROM / PROM / Strength  AROM;PROM;Strength      AROM   Overall AROM   Within functional limits for tasks performed      PROM   Left Hip External Rotation   30      Strength   Overall Strength  Within functional limits for tasks performed  Transfers   Transfers  Independent with all Transfers      Ambulation/Gait   Ambulation/Gait  No                Objective measurements completed on examination: See above findings.    Pelvic Floor Special Questions - 06/23/18 0001    Prior Pregnancies  Yes    Number of Pregnancies  3    Number of Vaginal Deliveries  3    Episiotomy Performed  Yes    Currently Sexually Active  No    Urinary Leakage  No    Fecal incontinence  Yes   fecal urgency   Skin Integrity  Intact    Perineal Body/Introitus   Other   firmness   External Palpation  firmness in the perineal body    Pelvic Floor Internal Exam  Patient confims identification and approves PT to assess pelvic floor and treatment    Exam Type  Rectal    Palpation  anterior portion of the the internal sphinter is tight and does not form the circular contraction    Strength  weak squeeze, no lift   trouble bringing the puborectalis forward   Strength # of reps  5    Strength # of seconds  5    Tone  low               PT Education - 06/23/18  0927    Education Details  Pelvic floor contraction in sidely    Person(s) Educated  Patient    Methods  Explanation;Demonstration;Verbal cues;Handout    Comprehension  Returned demonstration;Verbalized understanding       PT Short Term Goals - 06/23/18 0939      PT SHORT TERM GOAL #1   Title  independent with initial HEP    Time  4    Period  Weeks    Status  New    Target Date  07/21/18      PT SHORT TERM GOAL #2   Title  ability to form a circular contraction due to improve tissue mobility    Time  4    Period  Weeks    Status  New    Target Date  07/21/18      PT SHORT TERM GOAL #3   Title  increased awareness of contracting the anal sphincter    Time  4    Period  Weeks    Status  New    Target Date  07/21/18        PT Long Term Goals - 06/23/18 0940      PT LONG TERM GOAL #1   Title  Independent with HEP and understands how to progress herself    Time  8    Period  Weeks    Status  New    Target Date  08/18/18      PT LONG TERM GOAL #2   Title  ability to contract the puborectalis forward due to increased pelvic floor strength >/= 4/5    Time  8    Period  Weeks    Status  New    Target Date  08/18/18      PT LONG TERM GOAL #3   Title  not have to wipe more than 2 times after a bowel movement due to increased sphincter strength and hold 30 seconds    Time  8    Period  Weeks    Status  New  Target Date  08/18/18             Plan - 06/23/18 0932    Clinical Impression Statement  Patient is a 68 year old female with fecal incontinence 1 time in the summer and was not able to control it. Patient reports no rectal pain and no issues with straining with bowel movement. Patient reports she does have to wipe several times after a bowel movement. Patient reports she is now having 2 bowel movements that area firmer. Anal sphincter strength is 2/5 with the anterior portion is tight not making a circular contraction. The puborectalis muscle does not come  forwad with pelvic floor contraction. Patient has difficulty with sensing the pelvic floor contraction. Patient will benefit from skilled PT to improve pelvic floor strength and tissue mobility to improve continence.     History and Personal Factors relevant to plan of care:  None     Clinical Presentation  Stable    Clinical Presentation due to:  stable condition    Clinical Decision Making  Low    Rehab Potential  Excellent    Clinical Impairments Affecting Rehab Potential  none    PT Frequency  1x / week    PT Duration  8 weeks    PT Treatment/Interventions  Biofeedback;Therapeutic activities;Therapeutic exercise;Neuromuscular re-education;Patient/family education;Manual techniques;Dry needling    PT Next Visit Plan  pelvic floor EMG; soft tissue work to the anterior sphincter area; left hip ER stretch    Consulted and Agree with Plan of Care  Patient       Patient will benefit from skilled therapeutic intervention in order to improve the following deficits and impairments:  Increased fascial restricitons, Decreased coordination, Increased muscle spasms, Decreased activity tolerance, Decreased endurance, Decreased range of motion, Decreased strength  Visit Diagnosis: Muscle weakness (generalized)  Incontinence of feces, unspecified fecal incontinence type     Problem List Patient Active Problem List   Diagnosis Date Noted  . Family history of breast cancer   . Family history of melanoma   . Paresthesia of left leg 02/13/2015  . Osteopenia 10/06/2013  . Visit for preventive health examination 10/06/2013  . Fatigue 03/02/2011  . Family history of pancreatic cancer 03/02/2011  . OTHER DYSPHAGIA 12/31/2009  . OTHER THALASSEMIA 09/23/2007    Earlie Counts, PT 06/23/18 9:47 AM   [facility] [address], [place], [place], [address] Phone: [phone]   Fax:  [phone]  Name: Heather Goodwin MRN:  [id number] Date of Birth: 27-Oct-1950

## 2018-06-23 NOTE — Patient Instructions (Addendum)
Quick Contraction: Gravity Eliminated (Side-Lying)    Lie on left side, hips and knees slightly bent. Quickly squeeze then fully relax pelvic floor. Perform __1_ sets of _5__. Rest for _1__ seconds between sets. Do _2__ times a day. Make sure you fully relax the muscle prior to the next contraction.   Copyright  VHI. All rights reserved.    Slow Contraction: Gravity Eliminated (Side-Lying)    Lie on left side, hips and knees slightly bent. Slowly squeeze pelvic floor for _5__ seconds. Rest for _5__ seconds. Repeat _10__ times. Do _2-3__ times a day.   Copyright  VHI. All rights reserved.  [facility] [address] [place], [place] [address] Phone # [phone] Fax [phone]

## 2018-06-27 ENCOUNTER — Telehealth: Payer: Self-pay | Admitting: Gastroenterology

## 2018-06-27 MED ORDER — COLESTIPOL HCL 1 G PO TABS: 1.0000 g | ORAL_TABLET | Freq: Two times a day (BID) | ORAL | 1 refills | Status: DC

## 2018-06-27 NOTE — Telephone Encounter (Signed)
Patient states she needs a refill of medication colestipol sent to [facility] in [place]. Patient rescheduled her ov from 1.8.2020 to 2.21.2020.

## 2018-06-27 NOTE — Telephone Encounter (Signed)
Refilled patients medication today electronically  Has appt on 08/12/2018

## 2018-06-28 NOTE — Telephone Encounter (Signed)
error 

## 2018-06-29 ENCOUNTER — Ambulatory Visit: Payer: Medicare HMO | Admitting: Gastroenterology

## 2018-07-07 ENCOUNTER — Encounter: Payer: Self-pay | Admitting: Physical Therapy

## 2018-07-07 ENCOUNTER — Ambulatory Visit: Payer: Medicare HMO | Admitting: Physical Therapy

## 2018-07-07 DIAGNOSIS — M6281 Muscle weakness (generalized): Secondary | ICD-10-CM | POA: Diagnosis not present

## 2018-07-07 DIAGNOSIS — R159 Full incontinence of feces: Secondary | ICD-10-CM

## 2018-07-07 NOTE — Therapy (Signed)
[facility] [address] [place], [place], [address] Phone: [phone]   Fax:  [phone]  Physical Therapy Treatment  Patient Details  Name: Heather Goodwin MRN: [id number] Date of Birth: [birth year] Referring Provider (PT): Dr. Harl Bowie   Encounter Date: 07/07/2018  PT End of Session - 07/07/18 0843    Visit Number  2    Date for PT Re-Evaluation  08/18/18    Authorization Type  [employer] Medicare    PT Start Time  0800    PT Stop Time  0840    PT Time Calculation (min)  40 min    Activity Tolerance  Patient tolerated treatment well    Behavior During Therapy  [facility] for tasks assessed/performed       Past Medical History:  Diagnosis Date  . Beta thalassemia trait   . Foot fracture    at gym    Past Surgical History:  Procedure Laterality Date  . left knee surgery  2009   meniscus   . US ECHOCARDIOGRAPHY  2005   normal for syncope     There were no vitals filed for this visit.  Subjective Assessment - 07/07/18 0804    Subjective  I am doing fine. I am not having the urgency. Sometimes I just wipe 1 time.     Patient Stated Goals  Strengthen pelvic floor movement    Currently in Pain?  No/denies    Multiple Pain Sites  No                    Pelvic Floor Special Questions - 07/07/18 0001    Biofeedback  sitting quick contract 40 uv, Patient will fatique after 5 seconds initially and hold her breath, after practice she is able to hold 10 second with decreased control aroun 10-15 uv. , program with 2 quick flicks and 2 5 second holds took time to master in sitting    Biofeedback sensor type  Surface   rectal    Biofeedback Activity  Quick contraction;10 second hold   sitting       [facility] Adult PT Treatment/Exercise - 07/07/18 0001      Manual Therapy   Manual Therapy  Soft tissue mobilization    Soft tissue mobilization  circular abdominal massage to promote the peristalic  motion of the intestines             PT Education - 07/07/18 0829    Education Details  pelvic floor contraction in sitting, piriformis stretch, abdominal massage    Person(s) Educated  Patient    Methods  Explanation;Demonstration;Verbal cues;Handout    Comprehension  Verbalized understanding;Returned demonstration       PT Short Term Goals - 07/07/18 0846      PT SHORT TERM GOAL #1   Title  independent with initial HEP    Time  4    Period  Weeks    Status  Achieved      PT SHORT TERM GOAL #3   Title  increased awareness of contracting the anal sphincter    Time  4    Period  Weeks    Status  Achieved        PT Long Term Goals - 06/23/18 0940      PT LONG TERM GOAL #1   Title  Independent with HEP and understands how to progress herself    Time  8    Period  Weeks  Status  New    Target Date  08/18/18      PT LONG TERM GOAL #2   Title  ability to contract the puborectalis forward due to increased pelvic floor strength >/= 4/5    Time  8    Period  Weeks    Status  New    Target Date  08/18/18      PT LONG TERM GOAL #3   Title  not have to wipe more than 2 times after a bowel movement due to increased sphincter strength and hold 30 seconds    Time  8    Period  Weeks    Status  New    Target Date  08/18/18            Plan - 07/07/18 0843    Clinical Impression Statement  Patient needs verbal cues to not hold her breath and not contract her gluteals with pelvic floor contraction in sitting. Patient will fatigue after 5 second but with practice she is able to perform for 10 seconds. Patient reports she is not having the urgency. Patient reports she is able to wipe one time more often during the week. Patient will benefit from skilled PT to improve pelvic floor strength and tissue mobility to improve continence.     Rehab Potential  Excellent    Clinical Impairments Affecting Rehab Potential  none    PT Frequency  1x / week    PT Duration  8 weeks     PT Treatment/Interventions  Biofeedback;Therapeutic activities;Therapeutic exercise;Neuromuscular re-education;Patient/family education;Manual techniques;Dry needling    PT Next Visit Plan  pelvic floor EMG working on control    Recommended Other Services  Md signed initial eval    Consulted and Agree with Plan of Care  Patient       Patient will benefit from skilled therapeutic intervention in order to improve the following deficits and impairments:  Increased fascial restricitons, Decreased coordination, Increased muscle spasms, Decreased activity tolerance, Decreased endurance, Decreased range of motion, Decreased strength  Visit Diagnosis: Muscle weakness (generalized)  Incontinence of feces, unspecified fecal incontinence type     Problem List Patient Active Problem List   Diagnosis Date Noted  . Family history of breast cancer   . Family history of melanoma   . Paresthesia of left leg 02/13/2015  . Osteopenia 10/06/2013  . Visit for preventive health examination 10/06/2013  . Fatigue 03/02/2011  . Family history of pancreatic cancer 03/02/2011  . OTHER DYSPHAGIA 12/31/2009  . OTHER THALASSEMIA 09/23/2007    Earlie Counts, PT 07/07/18 8:47 AM   [facility] [address], [place], [place], [address] Phone: [phone]   Fax:  [phone]  Name: Heather Goodwin MRN: [id number] Date of Birth: Jun 17, 1951

## 2018-07-07 NOTE — Patient Instructions (Addendum)
Quick Contraction: Gravity Resisted (Sitting)    Sitting, quickly squeeze then fully relax pelvic floor. Perform __1_ sets of _5 reps__. Rest for __1_ seconds between sets. Do _5__ times a day.  Copyright  VHI. All rights reserved.  Slow Contraction: Gravity Resisted (Sitting)    Sitting, slowly squeeze pelvic floor for _10__ seconds. Rest for _10__ seconds. Repeat _5__ times. Do _5__ times a day.  Copyright  VHI. All rights reserved.  Piriformis Stretch, Sitting    Sit, one ankle on opposite knee, same-side hand on crossed knee. Push down on knee, keeping spine straight. Lean torso forward, with flat back, until tension is felt in hamstrings and gluteals of crossed-leg side. Hold _30__ seconds.  Repeat __1_ times per session. Do _1-2__ sessions per day.  Copyright  VHI. All rights reserved.  About Abdominal Massage  Abdominal massage, also called external colon massage, is a self-treatment circular massage technique that can reduce and eliminate gas and ease constipation. The colon naturally contracts in waves in a clockwise direction starting from inside the right hip, moving up toward the ribs, across the belly, and down inside the left hip.  When you perform circular abdominal massage, you help stimulate your colon's normal wave pattern of movement called peristalsis.  It is most beneficial when done after eating.  Positioning You can practice abdominal massage with oil while lying down, or in the shower with soap.  Some people find that it is just as effective to do the massage through clothing while sitting or standing.  How to Massage Start by placing your finger tips or knuckles on your right side, just inside your hip bone.  . Make small circular movements while you move upward toward your rib cage.   . Once you reach the bottom right side of your rib cage, take your circular movements across to the left side of the bottom of your rib cage.  . Next, move downward until  you reach the inside of your left hip bone.  This is the path your feces travel in your colon. . Continue to perform your abdominal massage in this pattern for 10 minutes each day.     You can apply as much pressure as is comfortable in your massage.  Start gently and build pressure as you continue to practice.  Notice any areas of pain as you massage; areas of slight pain may be relieved as you massage, but if you have areas of significant or intense pain, consult with your healthcare provider.  Other Considerations . General physical activity including bending and stretching can have a beneficial massage-like effect on the colon.  Deep breathing can also stimulate the colon because breathing deeply activates the same nervous system that supplies the colon.   . Abdominal massage should always be used in combination with a bowel-conscious diet that is high in the proper type of fiber for you, fluids (primarily water), and a regular exercise program. [facility] [address], [place], [place] [address] Phone # [phone] Fax [phone]

## 2018-08-04 ENCOUNTER — Encounter: Payer: Self-pay | Admitting: Physical Therapy

## 2018-08-04 ENCOUNTER — Ambulatory Visit: Payer: Medicare HMO | Attending: Internal Medicine | Admitting: Physical Therapy

## 2018-08-04 DIAGNOSIS — M6281 Muscle weakness (generalized): Secondary | ICD-10-CM | POA: Insufficient documentation

## 2018-08-04 DIAGNOSIS — R159 Full incontinence of feces: Secondary | ICD-10-CM | POA: Diagnosis present

## 2018-08-04 NOTE — Patient Instructions (Addendum)
Elevator (Sitting)    Sitting, imagine pelvic floor with 3 levels. "Going up", contract pelvic floor and Hold for 2___ seconds on each level. Then "going down", contract pelvic floor and Hold for _2__ seconds on each level. Relax. Repeat _5__ times. Do __2_ times a day.  Copyright  VHI. All rights reserved.    Slow Contraction: Gravity Resisted (Sitting)    Sitting, slowly squeeze pelvic floor for _30__ seconds. Rest for _10__ seconds. Repeat __5_ times. Do _2__ times a day.  Copyright  VHI. All rights reserved.  [facility] [address] [place], [place] [address] Phone # [phone] Fax [phone]

## 2018-08-04 NOTE — Therapy (Signed)
[facility] [address] [place], [place], [address] Phone: [phone]   Fax:  [phone]  Physical Therapy Treatment  Patient Details  Name: Heather Goodwin MRN: [id number] Date of Birth: 1950/06/26 Referring Provider (PT): Dr. Harl Bowie   Encounter Date: 08/04/2018  PT End of Session - 08/04/18 0810    Visit Number  3    Date for PT Re-Evaluation  10/13/18    Authorization Type  [employer]na Medicare    PT Start Time  0800    PT Stop Time  0840    PT Time Calculation (min)  40 min    Activity Tolerance  Patient tolerated treatment well    Behavior During Therapy  [facility] for tasks assessed/performed       Past Medical History:  Diagnosis Date  . Beta thalassemia trait   . Foot fracture    at gym    Past Surgical History:  Procedure Laterality Date  . left knee surgery  2009   meniscus   . US ECHOCARDIOGRAPHY  2005   normal for syncope     There were no vitals filed for this visit.  Subjective Assessment - 08/04/18 0808    Subjective  I am not wiping as often. My stool is soft. The stool is longer.     Patient Stated Goals  Strengthen pelvic floor movement    Currently in Pain?  No/denies    Multiple Pain Sites  No         [facility] PT Assessment - 08/04/18 0001      Assessment   Medical Diagnosis  R15.9 Incontinence of feces, unspecified fecal incontinencetype; R19.7 Diarrhea, unspedified    Referring Provider (PT)  Dr. Harl Bowie    Onset Date/Surgical Date  11/20/17    Prior Therapy  none      Precautions   Precautions  None      Home Environment   Living Environment  Private residence      Prior Function   Level of Independence  Independent    Vocation  Full time employment    Leisure  goes to the gym 1 time per day 5 days per week      Cognition   Overall Cognitive Status  Within Functional Limits for tasks assessed      Posture/Postural Control   Posture/Postural Control   No significant limitations      AROM   Overall AROM   Within functional limits for tasks performed      PROM   Left Hip External Rotation   30      Strength   Overall Strength  Within functional limits for tasks performed      Transfers   Transfers  Independent with all Transfers                Pelvic Floor Special Questions - 08/04/18 0001    Number of Pregnancies  3    Number of Vaginal Deliveries  3    Episiotomy Performed  Yes    Currently Sexually Active  No    Urinary Leakage  No    Fecal incontinence  Yes   fecal urgency   Palpation  anterior portion of the the internal sphinter is tight and does not form the circular contraction    Strength  weak squeeze, no lift   trouble bringing the puborectalis forward   Strength # of reps  5    Strength # of seconds  5    Biofeedback  sitting quick flicks then hold 5 sec, sitting contract up and down stairs, sitting contraction for 30 seconds    Biofeedback sensor type  Surface   rectal        [facility] Adult PT Treatment/Exercise - 08/04/18 0001      Self-Care   Self-Care  Other Self-Care Comments    Other Self-Care Comments   education on firming up stools with fiber, education on home pelvic floor biofeedback units             PT Education - 08/04/18 1010    Education Details  pelvic floor contraction for 30 seconds, going up and down steps, home units    Person(s) Educated  Patient    Methods  Explanation;Demonstration;Verbal cues;Handout    Comprehension  Returned demonstration;Verbalized understanding       PT Short Term Goals - 08/04/18 1013      PT SHORT TERM GOAL #1   Title  independent with initial HEP    Time  4    Period  Weeks    Status  Achieved      PT SHORT TERM GOAL #2   Title  ability to form a circular contraction due to improve tissue mobility    Time  4    Period  Weeks    Status  On-going    Target Date  07/21/18      PT SHORT TERM GOAL #3   Title  increased awareness of  contracting the anal sphincter    Time  4    Period  Weeks    Status  Achieved    Target Date  07/21/18        PT Long Term Goals - 08/04/18 1014      PT LONG TERM GOAL #1   Title  Independent with HEP and understands how to progress herself    Baseline  still learning    Time  8    Period  Weeks    Status  On-going      PT LONG TERM GOAL #2   Title  ability to contract the puborectalis forward due to increased pelvic floor strength >/= 4/5    Time  8    Period  Weeks    Status  On-going      PT LONG TERM GOAL #3   Title  not have to wipe more than 2 times after a bowel movement due to increased sphincter strength and hold 30 seconds    Baseline  can so some of the time, stool is longer    Period  Weeks    Status  On-going            Plan - 08/04/18 1610    Clinical Impression Statement  Patient reports her stool is longer now. Patient will wipe 2 times but sometimes is now wiping 1 time after a bowel movement. Resting tone of pelvic floor is 2 uv. Patient has difficulty with slowly contracting the pelvic floor due to decreased control. Patient has difficulty with contracting the pelvic floor for 30 seconds without holding her breath and maintaining above 15 uv. Patient will benefit from skilled PT to improve pelvic floor strength and tissue mobility to improve continence.     Rehab Potential  Excellent    Clinical Impairments Affecting Rehab Potential  none    PT Frequency  1x / week    PT Duration  8 weeks    PT Treatment/Interventions  Biofeedback;Therapeutic activities;Therapeutic exercise;Neuromuscular re-education;Patient/family education;Manual techniques;Dry needling    PT Next Visit Plan  pelvic floor EMG working on control and holding for 30 seconds, check pelvic floor circular contraction with pulling puborectalis forward    Consulted and Agree with Plan of Care  Patient       Patient will benefit from skilled therapeutic intervention in order to improve  the following deficits and impairments:  Increased fascial restricitons, Decreased coordination, Increased muscle spasms, Decreased activity tolerance, Decreased endurance, Decreased range of motion, Decreased strength  Visit Diagnosis: Muscle weakness (generalized) - Plan: PT plan of care cert/re-cert  Incontinence of feces, unspecified fecal incontinence type - Plan: PT plan of care cert/re-cert     Problem List Patient Active Problem List   Diagnosis Date Noted  . Family history of breast cancer   . Family history of melanoma   . Paresthesia of left leg 02/13/2015  . Osteopenia 10/06/2013  . Visit for preventive health examination 10/06/2013  . Fatigue 03/02/2011  . Family history of pancreatic cancer 03/02/2011  . OTHER DYSPHAGIA 12/31/2009  . OTHER THALASSEMIA 09/23/2007    Earlie Counts, PT 08/04/18 10:21 AM   [facility] [address] [place], [place], [address] Phone: [phone]   Fax:  [phone]  Name: Heather Goodwin MRN: [id number] Date of Birth: 06-Oct-1950

## 2018-08-12 ENCOUNTER — Encounter

## 2018-08-12 ENCOUNTER — Telehealth: Payer: Self-pay | Admitting: Gastroenterology

## 2018-08-12 ENCOUNTER — Ambulatory Visit: Payer: Medicare HMO | Admitting: Gastroenterology

## 2018-08-12 NOTE — Telephone Encounter (Signed)
Patient was on the schedule for this morning, called to rescheduled but Dr Sheppard Penton has no avail appt..Patient states that "she needs to be accommodated being that it was not her fault" cannot wait until April to schedule appt. Does not want to see a PA

## 2018-08-12 NOTE — Telephone Encounter (Signed)
Spoke with the patient. She states she is "doing well." No problems with her medications. Involved with Pelvic floor PT. Agrees to an appointment on 09/14/18 at 3:00 pm. Asks to be notified if there is a sooner opening. Declines sooner appointment with an APP.

## 2018-08-16 ENCOUNTER — Telehealth: Payer: Self-pay

## 2018-08-16 DIAGNOSIS — H5203 Hypermetropia, bilateral: Secondary | ICD-10-CM | POA: Diagnosis not present

## 2018-08-16 DIAGNOSIS — H2513 Age-related nuclear cataract, bilateral: Secondary | ICD-10-CM | POA: Diagnosis not present

## 2018-08-16 NOTE — Telephone Encounter (Signed)
Pt return called she is willing to take the appt. I did not see it open to sched. But she is wanting to come in. Thanks

## 2018-08-16 NOTE — Telephone Encounter (Signed)
Holding an appointment for the patient tomorrow 08/17/18 at 9:30 am. She had her appointment cancelled due to snow and ice. She requested sooner appointment. Left message for her to call.

## 2018-08-16 NOTE — Telephone Encounter (Signed)
I have moved her. I had the time held.

## 2018-08-17 ENCOUNTER — Encounter: Payer: Self-pay | Admitting: Gastroenterology

## 2018-08-17 ENCOUNTER — Ambulatory Visit: Payer: Medicare HMO | Admitting: Gastroenterology

## 2018-08-17 VITALS — BP 102/60 | HR 72 | Ht 67.0 in | Wt 150.0 lb

## 2018-08-17 DIAGNOSIS — K580 Irritable bowel syndrome with diarrhea: Secondary | ICD-10-CM | POA: Diagnosis not present

## 2018-08-17 DIAGNOSIS — K9089 Other intestinal malabsorption: Secondary | ICD-10-CM

## 2018-08-17 NOTE — Patient Instructions (Signed)
Increase Benefiber to 1-2 teaspoons three times a day with meals  In 2 weeks decrease Colestid to once daily for 2 weeks and then stop  Continue Pelvic floor therapy   Follow up in 6 months  I appreciate the  opportunity to care for you  Thank You   Harl Bowie , MD

## 2018-08-24 ENCOUNTER — Other Ambulatory Visit: Payer: Self-pay | Admitting: Gastroenterology

## 2018-08-24 ENCOUNTER — Telehealth: Payer: Self-pay | Admitting: Licensed Clinical Social Worker

## 2018-08-24 NOTE — Telephone Encounter (Signed)
Received voicemail from Heather Goodwin wanting to schedule a lab appointment to have genetic testing. Will call her back to set this up

## 2018-08-29 ENCOUNTER — Other Ambulatory Visit: Payer: Medicare HMO

## 2018-08-30 NOTE — Progress Notes (Signed)
Heather Goodwin    [id number]    1951-03-26  Primary Care Physician:Panosh, Standley Brooking, MD  Referring Physician: Burnis Medin, MD [place], [place] [address]  Chief complaint: Diarrhea, fecal incontinence HPI:  68 year old female with diarrhea and intermittent fecal incontinence, here for follow-up visit.  Last seen in November 2019. Significant improvement with Benefiber and Colestid.  She is also undergoing pelvic floor physical therapy.  Denies any episodes of fecal incontinence and is having formed bowel movement.  Overall doing well. Denies any nausea, vomiting, abdominal pain, melena or bright red blood per rectum  Normal colonoscopy in 2006 Colonoscopy 2012 with hyperplastic rectal polyp and internal hemorrhoids otherwise normal exam  Outpatient Encounter Medications as of 08/17/2018  Medication Sig  . Wheat Dextrin (BENEFIBER DRINK MIX PO) Take by mouth 3 (three) times daily. 3 teaspoons three times  daily  . [DISCONTINUED] colestipol (COLESTID) 1 g tablet Take 1 tablet (1 g total) by mouth 2 (two) times daily.   No facility-administered encounter medications on file as of 08/17/2018.     Allergies as of 08/17/2018  . (No Known Allergies)    Past Medical History:  Diagnosis Date  . Beta thalassemia trait   . Foot fracture    at gym    Past Surgical History:  Procedure Laterality Date  . left knee surgery  2009   meniscus   . US ECHOCARDIOGRAPHY  2005   normal for syncope     Family History  Problem Relation Age of Onset  . Thyroid disease Mother        partial thyroidectomy  . Pancreatic cancer Mother 71  . Cirrhosis Father        secondary to chronic hepatitis b infection  . Other Son        B Thal trait  . Breast cancer Paternal Aunt 27  . Breast cancer Maternal Aunt 80  . Pancreatic cancer Maternal Grandfather        dx 75-80  . Melanoma Brother 5  . Colon cancer Neg Hx   . Esophageal cancer Neg Hx   . Rectal  cancer Neg Hx     Social History   Socioeconomic History  . Marital status: Married    Spouse name: Not on file  . Number of children: 3  . Years of education: Not on file  . Highest education level: Not on file  Occupational History  . Occupation: Real [employer]  . Financial resource strain: Not on file  . Food insecurity:    Worry: Not on file    Inability: Not on file  . Transportation needs:    Medical: Not on file    Non-medical: Not on file  Tobacco Use  . Smoking status: Never Smoker  . Smokeless tobacco: Never Used  Substance and Sexual Activity  . Alcohol use: Yes    Alcohol/week: 1.0 - 4.0 standard drinks    Types: 1 - 4 Glasses of wine per week    Comment: occ  . Drug use: No  . Sexual activity: Not on file  Lifestyle  . Physical activity:    Days per week: Not on file    Minutes per session: Not on file  . Stress: Not on file  Relationships  . Social connections:    Talks on phone: Not on file    Gets together: Not on file    Attends religious service:  Not on file    Active member of club or organization: Not on file    Attends meetings of clubs or organizations: Not on file    Relationship status: Not on file  . Intimate partner violence:    Fear of current or ex partner: Not on file    Emotionally abused: Not on file    Physically abused: Not on file    Forced sexual activity: Not on file  Other Topics Concern  . Not on file  Social History Narrative   Married   Regular exercise- yes   Real estate-Management rentalproperties   3 children   2-3 caffeine drinks daily   Patient is right handed.            Review of systems: Review of Systems  Constitutional: Negative for fever and chills.  HENT: Negative.   Eyes: Negative for blurred vision.  Respiratory: Negative for cough, shortness of breath and wheezing.   Cardiovascular: Negative for chest pain and palpitations.  Gastrointestinal: as per HPI Genitourinary: Negative for  dysuria, urgency, frequency and hematuria.  Musculoskeletal: Negative for myalgias, back pain and joint pain.  Skin: Negative for itching and rash.  Neurological: Negative for dizziness, tremors, focal weakness, seizures and loss of consciousness.  Endo/Heme/Allergies: Positive for seasonal allergies.  Psychiatric/Behavioral: Negative for depression, suicidal ideas and hallucinations.  All other systems reviewed and are negative.   Physical Exam: Vitals:   08/17/18 0929  BP: 102/60  Pulse: 72   Body mass index is 23.49 kg/m. Gen:      No acute distress HEENT:  EOMI, sclera anicteric Neck:     No masses; no thyromegaly Lungs:    Clear to auscultation bilaterally; normal respiratory effort CV:         Regular rate and rhythm; no murmurs Abd:      + bowel sounds; soft, non-tender; no palpable masses, no distension Ext:    No edema; adequate peripheral perfusion Skin:      Warm and dry; no rash Neuro: alert and oriented x 3 Psych: normal mood and affect  Data Reviewed:  Reviewed labs, radiology imaging, old records and pertinent past GI work up   Assessment and Plan/Recommendations:  68 year old female with diarrhea and fecal incontinence, improved with fiber supplements and Colestid Increase Benefiber 1-2 teaspoons 3 times daily She is interested in tapering off of Colestid, advised to decrease to once daily for 2 weeks and then stop Continue pelvic floor exercise Return in 6 months or sooner if needed  15 minutes was spent face-to-face with the patient. Greater than 50% of the time used for counseling as well as treatment plan and follow-up. She had multiple questions which were answered to her satisfaction  K. Denzil Magnuson , MD [phone]    CC: Panosh, Standley Brooking, MD

## 2018-08-31 ENCOUNTER — Ambulatory Visit: Payer: Medicare HMO | Attending: Internal Medicine | Admitting: Physical Therapy

## 2018-08-31 ENCOUNTER — Other Ambulatory Visit: Payer: Self-pay

## 2018-08-31 ENCOUNTER — Encounter: Payer: Self-pay | Admitting: Physical Therapy

## 2018-08-31 DIAGNOSIS — M6281 Muscle weakness (generalized): Secondary | ICD-10-CM

## 2018-08-31 DIAGNOSIS — R159 Full incontinence of feces: Secondary | ICD-10-CM | POA: Diagnosis not present

## 2018-08-31 NOTE — Therapy (Addendum)
[facility] [address] [place], [place], [address] Phone: [phone]   Fax:  [phone]  Physical Therapy Treatment  Patient Details  Name: Heather Goodwin MRN: [id number] Date of Birth: 05-22-1951 Referring Provider (PT): Dr. Harl Bowie   Encounter Date: 08/31/2018  PT End of Session - 08/31/18 0810    Visit Number  4    Date for PT Re-Evaluation  10/13/18    Authorization Type  [employer] Medicare    PT Start Time  0810   came late   PT Stop Time  0843    PT Time Calculation (min)  33 min    Activity Tolerance  Patient tolerated treatment well    Behavior During Therapy  [facility] for tasks assessed/performed       Past Medical History:  Diagnosis Date  . Beta thalassemia trait   . Foot fracture    at gym    Past Surgical History:  Procedure Laterality Date  . left knee surgery  2009   meniscus   . US ECHOCARDIOGRAPHY  2005   normal for syncope     There were no vitals filed for this visit.  Subjective Assessment - 08/31/18 0810    Subjective  The last couple of weeks I have regressed. I have been eating more milk products. I have to wipe several more times after a bowel movement. My stool is softer.     Patient Stated Goals  Strengthen pelvic floor movement    Currently in Pain?  No/denies    Multiple Pain Sites  No                    Pelvic Floor Special Questions - 08/31/18 0001    Biofeedback  sitting contract 10 sec 30 uv with holding breath, When counting out loud and hold for 10 sec will fatgue at 8 seconds above 30uv, alternate with her eyes open and closed keeping it above 20 uv    Biofeedback sensor type  Surface   rectal        [facility] Adult PT Treatment/Exercise - 08/31/18 0001      Self-Care   Self-Care  Other Self-Care Comments    Other Self-Care Comments   discussion on how diary, sugar and glutens can affect the stool             PT Education - 08/31/18  0843    Education Details  Pelvic floor contraction in sitting with holding 30 sec and quick flicks so she is able to perform in her car    Person(s) Educated  Patient    Methods  Explanation;Demonstration;Handout    Comprehension  Verbalized understanding;Returned demonstration       PT Short Term Goals - 08/31/18 0848      PT SHORT TERM GOAL #1   Title  independent with initial HEP    Time  4    Period  Weeks    Status  Achieved      PT SHORT TERM GOAL #2   Title  ability to form a circular contraction due to improve tissue mobility    Time  4    Period  Weeks    Status  Achieved      PT SHORT TERM GOAL #3   Title  increased awareness of contracting the anal sphincter    Time  4    Period  Weeks    Status  Achieved  PT Long Term Goals - 08/31/18 0848      PT LONG TERM GOAL #1   Title  Independent with HEP and understands how to progress herself    Baseline  still learning    Time  8    Period  Weeks    Status  On-going      PT LONG TERM GOAL #2   Title  ability to contract the puborectalis forward due to increased pelvic floor strength >/= 4/5    Time  8    Period  Weeks    Status  On-going      PT LONG TERM GOAL #3   Title  not have to wipe more than 2 times after a bowel movement due to increased sphincter strength and hold 30 seconds    Baseline  can so some of the time, stool is longer    Time  8    Period  Weeks    Status  On-going            Plan - 08/31/18 0816    Clinical Impression Statement  Patient reports she is only able to perform her exercises in the car so her HEP will focus with her exercising in the car. Patient reports she is wiping several times after a bowel movement when the stool is softer. We discussed diet and how it may change the consistency of the stool. Patient is able to contract above 30 uv with 10 seconds but needs verbal cues to breath. Patient is able to contract 30 seconds with regrouping the muscles and counting  out loud. Patient will benefit from skilled therapy to improve pelvic floor strength and tissue mobility to improve continence.     Rehab Potential  Excellent    Clinical Impairments Affecting Rehab Potential  none    PT Frequency  1x / week    PT Duration  8 weeks    PT Treatment/Interventions  Biofeedback;Therapeutic activities;Therapeutic exercise;Neuromuscular re-education;Patient/family education;Manual techniques;Dry needling    PT Next Visit Plan  pelvic floor EMG working on control and holding for 30 seconds, check pelvic floor circular contraction with pulling puborectalis forward    Recommended Other Services  MD signed the initial and renewal.     Consulted and Agree with Plan of Care  Patient       Patient will benefit from skilled therapeutic intervention in order to improve the following deficits and impairments:  Increased fascial restricitons, Decreased coordination, Increased muscle spasms, Decreased activity tolerance, Decreased endurance, Decreased range of motion, Decreased strength  Visit Diagnosis: Muscle weakness (generalized)  Incontinence of feces, unspecified fecal incontinence type     Problem List Patient Active Problem List   Diagnosis Date Noted  . Family history of breast cancer   . Family history of melanoma   . Paresthesia of left leg 02/13/2015  . Osteopenia 10/06/2013  . Visit for preventive health examination 10/06/2013  . Fatigue 03/02/2011  . Family history of pancreatic cancer 03/02/2011  . OTHER DYSPHAGIA 12/31/2009  . OTHER THALASSEMIA 09/23/2007    Earlie Counts, PT 08/31/18 8:51 AM   [facility] [address] [place], [place], [address] Phone: [phone]   Fax:  [phone]  Name: Heather Goodwin MRN: [id number] Date of Birth: 1951-04-21  PHYSICAL THERAPY DISCHARGE SUMMARY  Visits from Start of Care: 4  Current functional level related to goals / functional  outcomes: See above. Patient had a lapse of treatment due to COVID  19. Called patient and she did not call back for further visits.    Remaining deficits: See above.    Education / Equipment: HEP Plan:                                                    Patient goals were partially met. Patient is being discharged due to not returning since the last visit. Thank you for the referral. Earlie Counts, PT 11/29/18 1:01 PM   ?????

## 2018-08-31 NOTE — Patient Instructions (Addendum)
Slow Contraction: Gravity Resisted (Sitting)    Sitting, slowly squeeze pelvic floor for _30__ seconds. Rest for _5__ seconds. Repeat _5__ times. Do _2__ times a day. End with 5 quick flicks. If have to can break up the reps throughout the day.  Copyright  VHI. All rights reserved.  [facility] [address], [place], [place] [address] Phone # [phone] Fax [phone]

## 2018-09-14 ENCOUNTER — Ambulatory Visit: Payer: Medicare HMO | Admitting: Gastroenterology

## 2018-10-05 ENCOUNTER — Ambulatory Visit: Payer: Medicare HMO | Admitting: Physical Therapy

## 2018-10-12 ENCOUNTER — Other Ambulatory Visit: Payer: Medicare HMO

## 2018-10-23 ENCOUNTER — Other Ambulatory Visit: Payer: Self-pay | Admitting: Gastroenterology

## 2018-10-27 ENCOUNTER — Telehealth: Payer: Self-pay | Admitting: Physical Therapy

## 2018-10-27 NOTE — Telephone Encounter (Signed)
Called patient to see is she would like to resume therapy. Left a message.  Earlie Counts, PT @5 /12/2018@ 5:02 PM

## 2018-11-17 ENCOUNTER — Telehealth: Payer: Self-pay | Admitting: Physical Therapy

## 2018-11-17 NOTE — Telephone Encounter (Signed)
Called patient and she reported she will have to call me back.  Earlie Counts, PT @5 /28/2020@ 1:40 PM

## 2018-12-05 ENCOUNTER — Other Ambulatory Visit: Payer: Medicare HMO

## 2018-12-07 ENCOUNTER — Other Ambulatory Visit: Payer: Self-pay

## 2018-12-07 ENCOUNTER — Inpatient Hospital Stay: Payer: Medicare HMO | Attending: Genetic Counselor

## 2018-12-07 DIAGNOSIS — Z800 Family history of malignant neoplasm of digestive organs: Secondary | ICD-10-CM | POA: Diagnosis not present

## 2018-12-07 DIAGNOSIS — Z803 Family history of malignant neoplasm of breast: Secondary | ICD-10-CM | POA: Diagnosis not present

## 2018-12-07 DIAGNOSIS — Z808 Family history of malignant neoplasm of other organs or systems: Secondary | ICD-10-CM | POA: Diagnosis not present

## 2018-12-20 ENCOUNTER — Ambulatory Visit: Payer: Self-pay | Admitting: Licensed Clinical Social Worker

## 2018-12-20 ENCOUNTER — Encounter: Payer: Self-pay | Admitting: Licensed Clinical Social Worker

## 2018-12-20 DIAGNOSIS — Z803 Family history of malignant neoplasm of breast: Secondary | ICD-10-CM

## 2018-12-20 DIAGNOSIS — Z808 Family history of malignant neoplasm of other organs or systems: Secondary | ICD-10-CM

## 2018-12-20 DIAGNOSIS — Z800 Family history of malignant neoplasm of digestive organs: Secondary | ICD-10-CM

## 2018-12-20 DIAGNOSIS — Z1379 Encounter for other screening for genetic and chromosomal anomalies: Secondary | ICD-10-CM | POA: Insufficient documentation

## 2018-12-20 NOTE — Progress Notes (Signed)
HPI:  [name] was previously seen in the [facility] clinic due to a family history of pancreatic cancer and concerns regarding a hereditary predisposition to cancer. Please refer to our prior cancer genetics clinic note for more information regarding our discussion, assessment and recommendations, at the time. [name] recent genetic test results were disclosed to her, as were recommendations warranted by these results. These results and recommendations are discussed in more detail below.  CANCER HISTORY:  Oncology History   No history exists.    FAMILY HISTORY:  We obtained a detailed, 4-generation family history.  Significant diagnoses are listed below: Family History  Problem Relation Age of Onset  . Thyroid disease Mother        partial thyroidectomy  . Pancreatic cancer Mother 76  . Cirrhosis Father        secondary to chronic hepatitis b infection  . Other Son        B Thal trait  . Breast cancer Paternal Aunt 42  . Breast cancer Maternal Aunt 80  . Pancreatic cancer Maternal Grandfather        dx 75-80  . Melanoma Brother 10  . Colon cancer Neg Hx   . Esophageal cancer Neg Hx   . Rectal cancer Neg Hx     [name] has three children, a daughter age 18 and two sons ages 71 and 24. Her 84 year old son has a daughter and a son, and her 78 year old son has a daughter. [name] has two brothers. Her 58 year old brother was recently diagnosed with melanoma, but she does not have details about this. He does not have children. Her 63 year old brother has a daughter and a son.  [name] father died of cirrhosis of the liver at 41. He had a sister, diagnosed with breast cancer at 7 and died at 68. She has four children, no cancer history. [name] had a paternal uncle as well who died in his 29's in [place].  [name] paternal grandfather died at 63 of a heart attack, and her paternal grandmother died at 86 of dementia.  [name] mother was  diagnosed with pancreatic cancer at 63 and died at 17. She also had issues with her thyroid and had it removed. [name] has four maternal aunts and 3 maternal uncles. Two of her aunts are twins, and one of the twins had breast cancer diagnosed at 45 and died at 53. The other twin died of dementia. Two of her aunts are living and an uncle is living. No cancer history in her maternal first cousins. She does have a second cousin (her grandfather's sister's daughter) diagnosed with breast cancer at 37 and died at 73. [name] maternal grandmother died at 2, and her maternal grandfather had pancreatic cancer between 43-80 and died shortly after diagnosis.   [name] is unaware of previous family history of genetic testing for hereditary cancer risks. Patient's maternal ancestors are of [place] descent, and paternal ancestors are of German/Irish/Scottish descent. There is no reported Ashkenazi Jewish ancestry. There is no known consanguinity.  GENETIC TEST RESULTS: Genetic testing reported out on 12/19/2018 through the Common Hereditary cancer panel found no pathogenic mutations.   The Common Hereditary Cancers Panel offered by [facility] includes sequencing and/or deletion duplication testing of the following 48 genes: APC, ATM, AXIN2, BARD1, BMPR1A, BRCA1, BRCA2, BRIP1, CDH1, CDKN2A (p14ARF), CDKN2A (p16INK4a), CKD4, CHEK2, CTNNA1, DICER1, EPCAM (Deletion/duplication testing only), GREM1 (promoter region deletion/duplication testing only), KIT, MEN1,  MLH1, MSH2, MSH3, MSH6, MUTYH, NBN, NF1, NHTL1, PALB2, PDGFRA, PMS2, POLD1, POLE, PTEN, RAD50, RAD51C, RAD51D, RNF43, SDHB, SDHC, SDHD, SMAD4, SMARCA4. STK11, TP53, TSC1, TSC2, and VHL.  The following genes were evaluated for sequence changes only: SDHA and HOXB13 c.251G>A variant only.  The test report has been scanned into EPIC and is located under the Molecular Pathology section of the Results Review tab.  A portion of the result report is included below for  reference.    We discussed with [name] that because current genetic testing is not perfect, it is possible there may be a gene mutation in one of these genes that current testing cannot detect, but that chance is small.  We also discussed, that there could be another gene that has not yet been discovered, or that we have not yet tested, that is responsible for the cancer diagnoses in the family. It is also possible there is a hereditary cause for the cancer in the family that [name] did not inherit and therefore was not identified in her testing.  Therefore, it is important to remain in touch with cancer genetics in the future so that we can continue to offer [name] the most up to date genetic testing.   Genetic testing did identify 2 Variants of uncertain significance (VUS) - one in the MSH6 gene called c.3070C>T and a second in the RAD50 gene called c.3843A>G. At this time, it is unknown if these variants are associated with increased cancer risk or if they are normal findings, but most variants such as these get reclassified to being inconsequential. They should not be used to make medical management decisions. With time, we suspect the lab will determine the significance of these variants, if any. If we do learn more about them, we will try to contact [name] to discuss it further. However, it is important to stay in touch with [place] periodically and keep the address and phone number up to date.  ADDITIONAL GENETIC TESTING: We discussed with [name] that her genetic testing was fairly extensive.  If there are genes identified to increase cancer risk that can be analyzed in the future, we would be happy to discuss and coordinate this testing at that time.    CANCER SCREENING RECOMMENDATIONS: [name] test result is considered negative (normal).  This means that we have not identified a hereditary cause for her  family history of cancer at this time.   While reassuring, this does not  definitively rule out a hereditary predisposition to cancer. It is still possible that there could be genetic mutations that are undetectable by current technology. There could be genetic mutations in genes that have not been tested or identified to increase cancer risk.  Therefore, it is recommended she continue to follow the cancer management and screening guidelines provided by her primary healthcare provider.   An individual's cancer risk and medical management are not determined by genetic test results alone. Overall cancer risk assessment incorporates additional factors, including personal medical history, family history, and any available genetic information that may result in a personalized plan for cancer prevention and surveillance  RECOMMENDATIONS FOR FAMILY MEMBERS:  Relatives in this family might be at some increased risk of developing cancer, over the general population risk, simply due to the family history of cancer.  We recommended female relatives in this family have a yearly mammogram beginning at age 79, or 68 years younger than the earliest onset of cancer, an annual clinical breast exam,  and perform monthly breast self-exams. Female relatives in this family should also have a gynecological exam as recommended by their primary provider. All family members should have a colonoscopy by age 54, or as directed by their physicians.   It is also possible there is a hereditary cause for the cancer in [name]'s family that she did not inherit and therefore was not identified in her.  Based on [name]'s family history, we recommended her maternal relatives have genetic counseling and testing. [name] will let us know if we can be of any assistance in coordinating genetic counseling and/or testing for these family members.  FOLLOW-UP: Lastly, we discussed with [name] that cancer genetics is a rapidly advancing field and it is possible that new genetic tests will be appropriate for her  and/or her family members in the future. We encouraged her to remain in contact with cancer genetics on an annual basis so we can update her personal and family histories and let her know of advances in cancer genetics that may benefit this family.   Our contact number was provided. [name] questions were answered to her satisfaction, and she knows she is welcome to call us at anytime with additional questions or concerns.   Faith Rogue, MS Genetic Counselor [email or web address] Phone: [phone]

## 2018-12-22 ENCOUNTER — Other Ambulatory Visit: Payer: Self-pay | Admitting: Gastroenterology

## 2019-01-03 DIAGNOSIS — R69 Illness, unspecified: Secondary | ICD-10-CM | POA: Diagnosis not present

## 2019-01-06 DIAGNOSIS — M79671 Pain in right foot: Secondary | ICD-10-CM | POA: Diagnosis not present

## 2019-01-06 DIAGNOSIS — R202 Paresthesia of skin: Secondary | ICD-10-CM | POA: Diagnosis not present

## 2019-01-06 DIAGNOSIS — S92504A Nondisplaced unspecified fracture of right lesser toe(s), initial encounter for closed fracture: Secondary | ICD-10-CM | POA: Diagnosis not present

## 2019-01-06 DIAGNOSIS — M25572 Pain in left ankle and joints of left foot: Secondary | ICD-10-CM | POA: Diagnosis not present

## 2019-01-18 ENCOUNTER — Other Ambulatory Visit: Payer: Self-pay

## 2019-01-18 ENCOUNTER — Ambulatory Visit
Admission: RE | Admit: 2019-01-18 | Discharge: 2019-01-18 | Disposition: A | Discharge status: Home / Self Care | Payer: Medicare HMO | Source: Ambulatory Visit | Attending: Obstetrics & Gynecology | Admitting: Obstetrics & Gynecology

## 2019-01-18 DIAGNOSIS — E2839 Other primary ovarian failure: Secondary | ICD-10-CM

## 2019-01-18 DIAGNOSIS — Z780 Asymptomatic menopausal state: Secondary | ICD-10-CM | POA: Diagnosis not present

## 2019-01-18 DIAGNOSIS — M8589 Other specified disorders of bone density and structure, multiple sites: Secondary | ICD-10-CM | POA: Diagnosis not present

## 2019-01-19 DIAGNOSIS — R69 Illness, unspecified: Secondary | ICD-10-CM | POA: Diagnosis not present

## 2019-03-15 DIAGNOSIS — D2371 Other benign neoplasm of skin of right lower limb, including hip: Secondary | ICD-10-CM | POA: Diagnosis not present

## 2019-03-15 DIAGNOSIS — L814 Other melanin hyperpigmentation: Secondary | ICD-10-CM | POA: Diagnosis not present

## 2019-03-15 DIAGNOSIS — L570 Actinic keratosis: Secondary | ICD-10-CM | POA: Diagnosis not present

## 2019-04-04 ENCOUNTER — Other Ambulatory Visit: Payer: Self-pay

## 2019-04-04 DIAGNOSIS — Z20822 Contact with and (suspected) exposure to covid-19: Secondary | ICD-10-CM

## 2019-04-04 DIAGNOSIS — Z20828 Contact with and (suspected) exposure to other viral communicable diseases: Secondary | ICD-10-CM | POA: Diagnosis not present

## 2019-04-06 LAB — NOVEL CORONAVIRUS, NAA: SARS-CoV-2, NAA: NOT DETECTED

## 2019-04-24 DIAGNOSIS — R69 Illness, unspecified: Secondary | ICD-10-CM | POA: Diagnosis not present

## 2019-04-25 DIAGNOSIS — R69 Illness, unspecified: Secondary | ICD-10-CM | POA: Diagnosis not present

## 2019-05-08 ENCOUNTER — Other Ambulatory Visit: Payer: Self-pay | Admitting: Obstetrics & Gynecology

## 2019-05-08 ENCOUNTER — Other Ambulatory Visit: Payer: Self-pay | Admitting: Gastroenterology

## 2019-05-08 DIAGNOSIS — Z1231 Encounter for screening mammogram for malignant neoplasm of breast: Secondary | ICD-10-CM

## 2019-05-17 ENCOUNTER — Other Ambulatory Visit: Payer: Self-pay | Admitting: *Deleted

## 2019-05-17 DIAGNOSIS — Z01419 Encounter for gynecological examination (general) (routine) without abnormal findings: Secondary | ICD-10-CM | POA: Diagnosis not present

## 2019-05-17 MED ORDER — COLESTIPOL HCL 1 G PO TABS: 1.0000 g | ORAL_TABLET | Freq: Two times a day (BID) | ORAL | 1 refills | Status: DC

## 2019-05-17 NOTE — Progress Notes (Signed)
Patients pharmacy fax request for Colestid sent electronically to pharmacy today

## 2019-06-13 ENCOUNTER — Ambulatory Visit: Payer: Medicare HMO | Attending: Internal Medicine

## 2019-06-13 DIAGNOSIS — Z20828 Contact with and (suspected) exposure to other viral communicable diseases: Secondary | ICD-10-CM | POA: Diagnosis not present

## 2019-06-13 DIAGNOSIS — Z20822 Contact with and (suspected) exposure to covid-19: Secondary | ICD-10-CM

## 2019-06-15 LAB — NOVEL CORONAVIRUS, NAA: SARS-CoV-2, NAA: DETECTED — AB

## 2019-07-03 ENCOUNTER — Ambulatory Visit
Admission: RE | Admit: 2019-07-03 | Discharge: 2019-07-03 | Disposition: A | Discharge status: Home / Self Care | Payer: Medicare HMO | Source: Ambulatory Visit | Attending: Obstetrics & Gynecology | Admitting: Obstetrics & Gynecology

## 2019-07-03 ENCOUNTER — Other Ambulatory Visit: Payer: Self-pay

## 2019-07-03 DIAGNOSIS — Z1231 Encounter for screening mammogram for malignant neoplasm of breast: Secondary | ICD-10-CM | POA: Diagnosis not present

## 2019-08-22 DIAGNOSIS — H2513 Age-related nuclear cataract, bilateral: Secondary | ICD-10-CM | POA: Diagnosis not present

## 2019-08-22 DIAGNOSIS — H5203 Hypermetropia, bilateral: Secondary | ICD-10-CM | POA: Diagnosis not present

## 2019-08-26 ENCOUNTER — Ambulatory Visit: Payer: Medicare HMO | Attending: Internal Medicine

## 2019-08-26 DIAGNOSIS — Z23 Encounter for immunization: Secondary | ICD-10-CM | POA: Insufficient documentation

## 2019-08-26 NOTE — Progress Notes (Signed)
   Covid-19 Vaccination Clinic  Name:  Heather Goodwin    MRN: [id number] DOB: [birth year]  08/26/2019  Heather Goodwin was observed post Covid-19 immunization for 15 minutes without incident. She was provided with Vaccine Information Sheet and instruction to access the V-Safe system.   Heather Goodwin was instructed to call 911 with any severe reactions post vaccine: Marland Kitchen Difficulty breathing  . Swelling of face and throat  . A fast heartbeat  . A bad rash all over body  . Dizziness and weakness   Immunizations Administered    Name Date Dose VIS Date Route   Pfizer COVID-19 Vaccine 08/26/2019  3:51 PM 0.3 mL 06/02/2019 Intramuscular   Manufacturer: [place]   Lot: [id number]   [place]: [id number]

## 2019-09-11 ENCOUNTER — Telehealth: Payer: Self-pay | Admitting: Internal Medicine

## 2019-09-11 NOTE — Telephone Encounter (Signed)
Dr.Fry can you please advise   Spoke to pt and advised that Dr.Panosh is out of the office so she will not be able to answer question. Pt stated that she just want to know if she should be seen. I advised pt that if she thinks she needs to come in then she should make an appt. Aslo, advised pt that she is current with Tdap vaccination. I advised pt that I will ask another provider for advise but they may be unable to advised not being able to see bite. I advised pt that if she does not hear anything back before 5p to call us back. Pt replied " I will if I remember to call back".

## 2019-09-11 NOTE — Telephone Encounter (Addendum)
Pt states that a dog, not known to her, bit her R inside ankle. It did break the skin with a little bit of blood-no pain. She did clean the area with soap, water, peroxide along with neosporin and called the vet to make sure the dog had its shots and was informed that he is up to date. She is wondering if she needs to be seen?   Offered to set up appt- pt wanted to know if PCP thinks she should.    Pt can be reached at [phone]   This note is not being shared with the patient for the following reason: To respect privacy (The patient or proxy has requested that the information not be shared).

## 2019-09-12 NOTE — Telephone Encounter (Signed)
Patient notified of update  and verbalized understanding. 

## 2019-09-12 NOTE — Telephone Encounter (Signed)
I do not think she needs to be seen. Dress the wound daily with Neosporin. If the area becomes more red or gets tender, she should come in

## 2019-09-14 ENCOUNTER — Other Ambulatory Visit: Payer: Self-pay | Admitting: *Deleted

## 2019-09-14 ENCOUNTER — Telehealth (HOSPITAL_COMMUNITY): Payer: Self-pay

## 2019-09-14 DIAGNOSIS — I83893 Varicose veins of bilateral lower extremities with other complications: Secondary | ICD-10-CM

## 2019-09-14 NOTE — Telephone Encounter (Signed)

## 2019-09-15 ENCOUNTER — Ambulatory Visit (HOSPITAL_COMMUNITY)
Admission: RE | Admit: 2019-09-15 | Discharge: 2019-09-15 | Disposition: A | Discharge status: Home / Self Care | Payer: Medicare HMO | Source: Ambulatory Visit | Attending: Vascular Surgery | Admitting: Vascular Surgery

## 2019-09-15 ENCOUNTER — Encounter: Payer: Self-pay | Admitting: Vascular Surgery

## 2019-09-15 ENCOUNTER — Ambulatory Visit (INDEPENDENT_AMBULATORY_CARE_PROVIDER_SITE_OTHER): Payer: Self-pay

## 2019-09-15 ENCOUNTER — Other Ambulatory Visit: Payer: Self-pay

## 2019-09-15 ENCOUNTER — Ambulatory Visit (INDEPENDENT_AMBULATORY_CARE_PROVIDER_SITE_OTHER): Payer: Medicare HMO | Admitting: Vascular Surgery

## 2019-09-15 VITALS — BP 125/87 | HR 65 | Temp 97.8°F | Resp 14 | Ht 67.0 in

## 2019-09-15 DIAGNOSIS — I8393 Asymptomatic varicose veins of bilateral lower extremities: Secondary | ICD-10-CM

## 2019-09-15 DIAGNOSIS — I83893 Varicose veins of bilateral lower extremities with other complications: Secondary | ICD-10-CM | POA: Diagnosis not present

## 2019-09-15 NOTE — Progress Notes (Signed)
Patient ID: Heather Goodwin, female   DOB: 12/13/1950, 69 y.o.   MRN: [id number]  Reason for Consult: Varicose Veins (bilateral varicose veins)   Referred by Regis Bill Standley Brooking, MD  Subjective:     HPI:  Heather Goodwin is a 69 y.o. female history of foot fracture in the past.  Does not have any lower extremity pain but does have unsightly spider veins and some varicosities bilateral anterior legs.  No tissue loss or ulceration.  Really does not have any swelling.  Denies any history of DVT.  Past Medical History:  Diagnosis Date  . Beta thalassemia trait   . Foot fracture    at gym   Family History  Problem Relation Age of Onset  . Thyroid disease Mother        partial thyroidectomy  . Pancreatic cancer Mother 35  . Cirrhosis Father        secondary to chronic hepatitis b infection  . Other Son        B Thal trait  . Breast cancer Paternal Aunt 85  . Breast cancer Maternal Aunt 80  . Pancreatic cancer Maternal Grandfather        dx 75-80  . Melanoma Brother 39  . Colon cancer Neg Hx   . Esophageal cancer Neg Hx   . Rectal cancer Neg Hx    Past Surgical History:  Procedure Laterality Date  . left knee surgery  2009   meniscus   . US ECHOCARDIOGRAPHY  2005   normal for syncope     Short Social History:  Social History   Tobacco Use  . Smoking status: Never Smoker  . Smokeless tobacco: Never Used  Substance Use Topics  . Alcohol use: Yes    Alcohol/week: 1.0 - 4.0 standard drinks    Types: 1 - 4 Glasses of wine per week    Comment: occ    No Known Allergies  Current Outpatient Medications  Medication Sig Dispense Refill  . colestipol (COLESTID) 1 g tablet Take 1 tablet (1 g total) by mouth 2 (two) times daily. 180 tablet 1  . Wheat Dextrin (BENEFIBER DRINK MIX PO) Take by mouth 3 (three) times daily. 3 teaspoons three times  daily     No current facility-administered medications for this visit.    Review of Systems  Constitutional:  Constitutional  negative. HENT: HENT negative.  Eyes: Eyes negative.  Cardiovascular: Cardiovascular negative.  GI: Gastrointestinal negative.  Musculoskeletal: Musculoskeletal negative.  Skin:       Spider veins bilaterally Neurological: Neurological negative. Hematologic: Hematologic/lymphatic negative.  Psychiatric: Psychiatric negative.        Objective:  Objective   Vitals:   09/15/19 1239  BP: 125/87  Pulse: 65  Resp: 14  Temp: 97.8 F (36.6 C)    Physical Exam HENT:     Head: Normocephalic.     Nose: Nose normal.  Cardiovascular:     Pulses: Normal pulses.  Pulmonary:     Effort: Pulmonary effort is normal.  Abdominal:     General: Abdomen is flat.  Musculoskeletal:        General: No swelling or deformity. Normal range of motion.  Skin:    General: Skin is warm and dry.     Capillary Refill: Capillary refill takes less than 2 seconds.  Neurological:     General: No focal deficit present.     Mental Status: She is alert.  Psychiatric:        Mood  and Affect: Mood normal.        Behavior: Behavior normal.        Thought Content: Thought content normal.        Judgment: Judgment normal.     Data: On the right side the greatest diameter is 0.31 cm at the knee there is only reflux at the calf where it measures 0.12 cm.  On the left side there is only reflux at the junction greatest diameter other than the junction is 0.33 cm in the proximal thigh.     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  She has bilateral small telangiectasias and varicosities.  These will be treated with sclerotherapy and I have introduced her to [place] in our office for evaluation.  She can follow-up on an as-needed basis.      [name] MD [facility]

## 2019-09-15 NOTE — Progress Notes (Signed)
Treated bilateral (below the knee) spider and reticular veins administered with a 27g butterfly.  Patient received a total of 3 mL of 1% Asclera. Treated right leg first and veins in left leg were very hard to see when she was laying flat. She walked around office and then parking lot for about 20 minutes, which was her idea in an effort to try to get the veins to be more pronounced. I marked the areas I could see and treated them. We discussed these may not respond on first treatment to her satifaction and likely retreating the areas in the future will need to be done. Pt verbalized understanding. Provided post procedure instructions on both handout and verbally. Will follow PRN.  Photos: Yes.    Compression stockings applied: Yes.

## 2019-09-26 ENCOUNTER — Ambulatory Visit: Payer: Medicare HMO | Attending: Internal Medicine

## 2019-09-26 DIAGNOSIS — Z23 Encounter for immunization: Secondary | ICD-10-CM

## 2019-09-26 NOTE — Progress Notes (Signed)
   Covid-19 Vaccination Clinic  Name:  Heather Goodwin    MRN: [id number] DOB: May 20, 1951  09/26/2019  [name] was observed post Covid-19 immunization for 15 minutes without incident. She was provided with Vaccine Information Sheet and instruction to access the V-Safe system.   [name] was instructed to call 911 with any severe reactions post vaccine: Marland Kitchen Difficulty breathing  . Swelling of face and throat  . A fast heartbeat  . A bad rash all over body  . Dizziness and weakness   Immunizations Administered    Name Date Dose VIS Date Route   Pfizer COVID-19 Vaccine 09/26/2019  9:05 AM 0.3 mL 06/02/2019 Intramuscular   Manufacturer: [employer]   Lot: [id number]   [place]: [id number]

## 2019-10-02 ENCOUNTER — Other Ambulatory Visit: Payer: Self-pay

## 2019-10-02 NOTE — Progress Notes (Signed)
Chief Complaint  Patient presents with  . Annual Exam    HPI: Patient  Heather Goodwin  69 y.o. comes in today for [place] visit  generally well has seen Gi for IBS sx controlled by fiber and cholystyramine.  Had dovid vaccine  Feels like ankles  Are tight or gripped  And sometimes feeling in toes but not numb  Color changes  Had vascular eval  Neg has superficial vv  fam hx pancreatic and cancer   Has had genetic counseling and testing  Has Gyne  Health Maintenance  Topic Date Due  . Hepatitis C Screening  Never done  . PNA vac Low Risk Adult (1 of 2 - PCV13) Never done  . INFLUENZA VACCINE  01/21/2020  . COLONOSCOPY  03/09/2021  . MAMMOGRAM  07/02/2021  . TETANUS/TDAP  10/07/2023  . DEXA SCAN  Completed   Health Maintenance Review LIFESTYLE:  Exercise:  y Tobacco/ETS:n Alcohol: ocass Sugar beverages:n Sleep:y Drug use: no HH of 2 Work: owns business FT    ROS:  GEN/ HEENT: No fever, significant weight changes sweats headaches vision problems hearing changes, CV/ PULM; No chest pain shortness of breath cough, syncope,edema  change in exercise tolerance. GI /GU: No adominal pain, vomiting, change in bowel habits. No blood in the stool. No significant GU symptoms. SKIN/HEME: ,no acute skin rashes suspicious lesions or bleeding. No lymphadenopathy, nodules, masses.  NEURO/ PSYCH:  No neurologic signs such as weakness numbness. No depression anxiety. IMM/ Allergy: No unusual infections.  Allergy .   REST of 12 system review negative except as per HPI   Past Medical History:  Diagnosis Date  . Beta thalassemia trait   . Foot fracture    at gym    Past Surgical History:  Procedure Laterality Date  . left knee surgery  2009   meniscus   . US ECHOCARDIOGRAPHY  2005   normal for syncope     Family History  Problem Relation Age of Onset  . Thyroid disease Mother        partial thyroidectomy  . Pancreatic cancer Mother 90  . Cirrhosis  Father        secondary to chronic hepatitis b infection  . Other Son        B Thal trait  . Breast cancer Paternal Aunt 62  . Breast cancer Maternal Aunt 80  . Pancreatic cancer Maternal Grandfather        dx 75-80  . Melanoma Brother 8  . Colon cancer Neg Hx   . Esophageal cancer Neg Hx   . Rectal cancer Neg Hx     Social History   Socioeconomic History  . Marital status: Unknown    Spouse name: Not on file  . Number of children: 3  . Years of education: Not on file  . Highest education level: Not on file  Occupational History  . Occupation: Real Estate  Tobacco Use  . Smoking status: Never Smoker  . Smokeless tobacco: Never Used  Substance and Sexual Activity  . Alcohol use: Yes    Alcohol/week: 1.0 - 4.0 standard drinks    Types: 1 - 4 Glasses of wine per week    Comment: occ  . Drug use: No  . Sexual activity: Not on file  Other Topics Concern  . Not on file  Social History Narrative   Married   Regular exercise- yes   Real estate-Management rentalproperties   3 children   2-3 caffeine  drinks daily   Patient is right handed.         Social Determinants of Health   Financial Resource Strain:   . Difficulty of Paying Living Expenses:   Food Insecurity:   . Worried About Charity fundraiser in the Last Year:   . Arboriculturist in the Last Year:   Transportation Needs:   . Film/video editor (Medical):   Marland Kitchen Lack of Transportation (Non-Medical):   Physical Activity:   . Days of Exercise per Week:   . Minutes of Exercise per Session:   Stress:   . Feeling of Stress :   Social Connections:   . Frequency of Communication with Friends and Family:   . Frequency of Social Gatherings with Friends and Family:   . Attends Religious Services:   . Active Member of Clubs or Organizations:   . Attends Archivist Meetings:   Marland Kitchen Marital Status:     Outpatient Medications Prior to Visit  Medication Sig Dispense Refill  . colestipol (COLESTID) 1 g  tablet Take 1 tablet (1 g total) by mouth 2 (two) times daily. 180 tablet 1  . Wheat Dextrin (BENEFIBER DRINK MIX PO) Take by mouth 3 (three) times daily. 3 teaspoons three times  daily     No facility-administered medications prior to visit.     EXAM:  BP 116/78 (BP Location: Right Arm, Patient Position: Sitting, Cuff Size: Normal)   Pulse 68   Temp (!) 97.1 F (36.2 C) (Temporal)   Ht 5\' 7"  (1.702 m)   Wt 150 lb 9.6 oz (68.3 kg)   SpO2 100%   BMI 23.59 kg/m   Body mass index is 23.59 kg/m. Wt Readings from Last 3 Encounters:  10/03/19 150 lb 9.6 oz (68.3 kg)  08/17/18 150 lb (68 kg)  05/06/18 152 lb (68.9 kg)    Physical Exam: Vital signs reviewed [id number] is a well-developed well-nourished alert cooperative    who appearsr stated age in no acute distress.  HEENT: normocephalic atraumatic , Eyes: PERRL EOM's full, conjunctiva clear, Nares: paten,t no deformity discharge or tenderness., Ears: no deformity EAC's clear TMs with normal landmarks. Mouth: clear OPmasked NECK: supple without masses, thyromegaly or bruits. CHEST/PULM:  Clear to auscultation and percussion breath sounds equal no wheeze , rales or rhonchi. No chest wall deformities or tenderness. Breast: deferred to gyn . CV: PMI is nondisplaced, S1 S2 no gallops, murmurs, rubs. Peripheral pulses are full without delay.No JVD .  ABDOMEN: Bowel sounds normal nontender  No guard or rebound, no hepato splenomegal no CVA tenderness.  No hernia. Extremtities:  No clubbing cyanosis or edema, no acute joint swelling or redness no focal atrophy NEURO:  Oriented x3, cranial nerves 3-12 appear to be intact, no obvious focal weakness,gait within normal limits no abnormal reflexes or asymmetrical snes to monofilment is intact  And nl color  SKIN: No acute rashes normal turgor, color, no bruising or petechiae. PSYCH: Oriented, good eye contact, no obvious depression anxiety, cognition and judgment appear normal. LN: no cervical  axillary inguinal adenopathy  Lab Results  Component Value Date   WBC 5.9 10/03/2019   HGB 12.4 10/03/2019   HCT 38.7 10/03/2019   PLT 222.0 10/03/2019   GLUCOSE 98 10/03/2019   CHOL 222 (H) 10/03/2019   TRIG 96.0 10/03/2019   HDL 70.50 10/03/2019   LDLDIRECT 123.1 03/02/2011   LDLCALC 132 (H) 10/03/2019   ALT 13 10/03/2019   AST 14 10/03/2019  NA 137 10/03/2019   K 4.0 10/03/2019   CL 101 10/03/2019   CREATININE 0.73 10/03/2019   BUN 13 10/03/2019   CO2 30 10/03/2019   TSH 1.85 10/03/2019    BP Readings from Last 3 Encounters:  10/03/19 116/78  09/15/19 125/87  08/17/18 102/60      ASSESSMENT AND PLAN:  Discussed the following assessment and plan:    ICD-10-CM   1. Visit for preventive health examination  Z00.00   2. Paresthesia of both feet  R20.2 Lipid panel    Basic metabolic panel    CBC with Differential/Platelet    Hepatic function panel    TSH    Vitamin B12   nl exam check b12  3. Medication management  Z79.899 Lipid panel    Basic metabolic panel    CBC with Differential/Platelet    Hepatic function panel    TSH    Vitamin B12  4. Anemia, unspecified type  D64.9 Lipid panel    Basic metabolic panel    CBC with Differential/Platelet    Hepatic function panel    TSH    Vitamin B12   has beta thal  5. Family history of pancreatic cancer  Z80.0   disc  getting prevnar 37 and then pneumovax  But jsut got  covid vaccine second  Had zostavax and first shingrix but doesn't remember if so   Return in about 1 year (around 10/02/2020) for cpx  or as needed .  Patient Care Team: Myli Pae, Standley Brooking, MD as PCP - General Azucena Fallen, MD (Obstetrics and Gynecology) Wylene Simmer, MD as Consulting Physician (Orthopedic Surgery) Patient Instructions  Will notify you  of labs when available.  Will check into  shingrix vaccine info  may just need one  prevnar 13 and  Pneumovax a year later.    Health Maintenance, Female Adopting a healthy lifestyle and  getting preventive care are important in promoting health and wellness. Ask your health care provider about:  The right schedule for you to have regular tests and exams.  Things you can do on your own to prevent diseases and keep yourself healthy. What should I know about diet, weight, and exercise? Eat a healthy diet   Eat a diet that includes plenty of vegetables, fruits, low-fat dairy products, and lean protein.  Do not eat a lot of foods that are high in solid fats, added sugars, or sodium. Maintain a healthy weight Body mass index (BMI) is used to identify weight problems. It estimates body fat based on height and weight. Your health care provider can help determine your BMI and help you achieve or maintain a healthy weight. Get regular exercise Get regular exercise. This is one of the most important things you can do for your health. Most adults should:  Exercise for at least 150 minutes each week. The exercise should increase your heart rate and make you sweat (moderate-intensity exercise).  Do strengthening exercises at least twice a week. This is in addition to the moderate-intensity exercise.  Spend less time sitting. Even light physical activity can be beneficial. Watch cholesterol and blood lipids Have your blood tested for lipids and cholesterol at 67 years of age, then have this test every 5 years. Have your cholesterol levels checked more often if:  Your lipid or cholesterol levels are high.  You are older than 67 years of age.  You are at high risk for heart disease. What should I know about cancer screening? Depending on your  health history and family history, you may need to have cancer screening at various ages. This may include screening for:  Breast cancer.  Cervical cancer.  Colorectal cancer.  Skin cancer.  Lung cancer. What should I know about heart disease, diabetes, and high blood pressure? Blood pressure and heart disease  High blood pressure  causes heart disease and increases the risk of stroke. This is more likely to develop in people who have high blood pressure readings, are of African descent, or are overweight.  Have your blood pressure checked: ? Every 3-5 years if you are 46-38 years of age. ? Every year if you are 79 years old or older. Diabetes Have regular diabetes screenings. This checks your fasting blood sugar level. Have the screening done:  Once every three years after age 31 if you are at a normal weight and have a low risk for diabetes.  More often and at a younger age if you are overweight or have a high risk for diabetes. What should I know about preventing infection? Hepatitis B If you have a higher risk for hepatitis B, you should be screened for this virus. Talk with your health care provider to find out if you are at risk for hepatitis B infection. Hepatitis C Testing is recommended for:  Everyone born from [birth year] through 1965.  Anyone with known risk factors for hepatitis C. Sexually transmitted infections (STIs)  Get screened for STIs, including gonorrhea and chlamydia, if: ? You are sexually active and are younger than 67 years of age. ? You are older than 67 years of age and your health care provider tells you that you are at risk for this type of infection. ? Your sexual activity has changed since you were last screened, and you are at increased risk for chlamydia or gonorrhea. Ask your health care provider if you are at risk.  Ask your health care provider about whether you are at high risk for HIV. Your health care provider may recommend a prescription medicine to help prevent HIV infection. If you choose to take medicine to prevent HIV, you should first get tested for HIV. You should then be tested every 3 months for as long as you are taking the medicine. Pregnancy  If you are about to stop having your period (premenopausal) and you may become pregnant, seek counseling before you get  pregnant.  Take 400 to 800 micrograms (mcg) of folic acid every day if you become pregnant.  Ask for birth control (contraception) if you want to prevent pregnancy. Osteoporosis and menopause Osteoporosis is a disease in which the bones lose minerals and strength with aging. This can result in bone fractures. If you are 10 years old or older, or if you are at risk for osteoporosis and fractures, ask your health care provider if you should:  Be screened for bone loss.  Take a calcium or vitamin D supplement to lower your risk of fractures.  Be given hormone replacement therapy (HRT) to treat symptoms of menopause. Follow these instructions at home: Lifestyle  Do not use any products that contain nicotine or tobacco, such as cigarettes, e-cigarettes, and chewing tobacco. If you need help quitting, ask your health care provider.  Do not use street drugs.  Do not share needles.  Ask your health care provider for help if you need support or information about quitting drugs. Alcohol use  Do not drink alcohol if: ? Your health care provider tells you not to drink. ? You are pregnant,  may be pregnant, or are planning to become pregnant.  If you drink alcohol: ? Limit how much you use to 0-1 drink a day. ? Limit intake if you are breastfeeding.  Be aware of how much alcohol is in your drink. In the [place] one drink equals one 12 oz bottle of beer (355 mL), one 5 oz glass of wine (148 mL), or one 1 oz glass of hard liquor (44 mL). General instructions  Schedule regular health, dental, and eye exams.  Stay current with your vaccines.  Tell your health care provider if: ? You often feel depressed. ? You have ever been abused or do not feel safe at home. Summary  Adopting a healthy lifestyle and getting preventive care are important in promoting health and wellness.  Follow your health care provider's instructions about healthy diet, exercising, and getting tested or screened for  diseases.  Follow your health care provider's instructions on monitoring your cholesterol and blood pressure. This information is not intended to replace advice given to you by your health care provider. Make sure you discuss any questions you have with your health care provider. Document Revised: 06/01/2018 Document Reviewed: 06/01/2018 Elsevier Patient Education  2020 [place] Deandrea Vanpelt M.D.

## 2019-10-03 ENCOUNTER — Ambulatory Visit (INDEPENDENT_AMBULATORY_CARE_PROVIDER_SITE_OTHER): Payer: Medicare HMO | Admitting: Internal Medicine

## 2019-10-03 ENCOUNTER — Encounter: Payer: Self-pay | Admitting: Internal Medicine

## 2019-10-03 VITALS — BP 116/78 | HR 68 | Temp 97.1°F | Ht 67.0 in | Wt 150.6 lb

## 2019-10-03 DIAGNOSIS — Z800 Family history of malignant neoplasm of digestive organs: Secondary | ICD-10-CM | POA: Diagnosis not present

## 2019-10-03 DIAGNOSIS — R202 Paresthesia of skin: Secondary | ICD-10-CM

## 2019-10-03 DIAGNOSIS — Z79899 Other long term (current) drug therapy: Secondary | ICD-10-CM | POA: Diagnosis not present

## 2019-10-03 DIAGNOSIS — Z0000 Encounter for general adult medical examination without abnormal findings: Secondary | ICD-10-CM | POA: Diagnosis not present

## 2019-10-03 DIAGNOSIS — D649 Anemia, unspecified: Secondary | ICD-10-CM

## 2019-10-03 LAB — HEPATIC FUNCTION PANEL
ALT: 13 U/L (ref 0–35)
AST: 14 U/L (ref 0–37)
Albumin: 4.6 g/dL (ref 3.5–5.2)
Alkaline Phosphatase: 50 U/L (ref 39–117)
Bilirubin, Direct: 0.1 mg/dL (ref 0.0–0.3)
Total Bilirubin: 0.6 mg/dL (ref 0.2–1.2)
Total Protein: 7 g/dL (ref 6.0–8.3)

## 2019-10-03 LAB — CBC WITH DIFFERENTIAL/PLATELET
Basophils Absolute: 0 10*3/uL (ref 0.0–0.1)
Basophils Relative: 0.6 % (ref 0.0–3.0)
Eosinophils Absolute: 0.1 10*3/uL (ref 0.0–0.7)
Eosinophils Relative: 1.1 % (ref 0.0–5.0)
HCT: 38.7 % (ref 36.0–46.0)
Hemoglobin: 12.4 g/dL (ref 12.0–15.0)
Lymphocytes Relative: 31.1 % (ref 12.0–46.0)
Lymphs Abs: 1.8 10*3/uL (ref 0.7–4.0)
MCHC: 32 g/dL (ref 30.0–36.0)
MCV: 62.6 fl — ABNORMAL LOW (ref 78.0–100.0)
Monocytes Absolute: 0.4 10*3/uL (ref 0.1–1.0)
Monocytes Relative: 7.3 % (ref 3.0–12.0)
Neutro Abs: 3.5 10*3/uL (ref 1.4–7.7)
Neutrophils Relative %: 59.9 % (ref 43.0–77.0)
Platelets: 222 10*3/uL (ref 150.0–400.0)
RBC: 6.18 Mil/uL — ABNORMAL HIGH (ref 3.87–5.11)
RDW: 14.8 % (ref 11.5–15.5)
WBC: 5.9 10*3/uL (ref 4.0–10.5)

## 2019-10-03 LAB — BASIC METABOLIC PANEL
BUN: 13 mg/dL (ref 6–23)
CO2: 30 mEq/L (ref 19–32)
Calcium: 9.9 mg/dL (ref 8.4–10.5)
Chloride: 101 mEq/L (ref 96–112)
Creatinine, Ser: 0.73 mg/dL (ref 0.40–1.20)
GFR: 79.64 mL/min (ref >60.00)
Glucose, Bld: 98 mg/dL (ref 70–99)
Potassium: 4 mEq/L (ref 3.5–5.1)
Sodium: 137 mEq/L (ref 135–145)

## 2019-10-03 LAB — VITAMIN B12: Vitamin B-12: 543 pg/mL (ref 211–911)

## 2019-10-03 LAB — LIPID PANEL
Cholesterol: 222 mg/dL — ABNORMAL HIGH (ref 0–200)
HDL: 70.5 mg/dL (ref >39.00)
LDL Cholesterol: 132 mg/dL — ABNORMAL HIGH (ref 0–99)
NonHDL: 151.34
Total CHOL/HDL Ratio: 3
Triglycerides: 96 mg/dL (ref 0.0–149.0)
VLDL: 19.2 mg/dL (ref 0.0–40.0)

## 2019-10-03 LAB — TSH: TSH: 1.85 u[IU]/mL (ref 0.35–4.50)

## 2019-10-03 NOTE — Progress Notes (Signed)
Blood results are stable  cholesterol up slightly from last year  but acceptable  and still low  risk  Blood sugar and thyroid , liver and b12 levels are all normal .

## 2019-10-03 NOTE — Patient Instructions (Signed)
Will notify you  of labs when available.  Will check into  shingrix vaccine info  may just need one  prevnar 13 and  Pneumovax a year later.    Health Maintenance, Female Adopting a healthy lifestyle and getting preventive care are important in promoting health and wellness. Ask your health care provider about:  The right schedule for you to have regular tests and exams.  Things you can do on your own to prevent diseases and keep yourself healthy. What should I know about diet, weight, and exercise? Eat a healthy diet   Eat a diet that includes plenty of vegetables, fruits, low-fat dairy products, and lean protein.  Do not eat a lot of foods that are high in solid fats, added sugars, or sodium. Maintain a healthy weight Body mass index (BMI) is used to identify weight problems. It estimates body fat based on height and weight. Your health care provider can help determine your BMI and help you achieve or maintain a healthy weight. Get regular exercise Get regular exercise. This is one of the most important things you can do for your health. Most adults should:  Exercise for at least 150 minutes each week. The exercise should increase your heart rate and make you sweat (moderate-intensity exercise).  Do strengthening exercises at least twice a week. This is in addition to the moderate-intensity exercise.  Spend less time sitting. Even light physical activity can be beneficial. Watch cholesterol and blood lipids Have your blood tested for lipids and cholesterol at 67 years of age, then have this test every 5 years. Have your cholesterol levels checked more often if:  Your lipid or cholesterol levels are high.  You are older than 67 years of age.  You are at high risk for heart disease. What should I know about cancer screening? Depending on your health history and family history, you may need to have cancer screening at various ages. This may include screening for:  Breast  cancer.  Cervical cancer.  Colorectal cancer.  Skin cancer.  Lung cancer. What should I know about heart disease, diabetes, and high blood pressure? Blood pressure and heart disease  High blood pressure causes heart disease and increases the risk of stroke. This is more likely to develop in people who have high blood pressure readings, are of African descent, or are overweight.  Have your blood pressure checked: ? Every 3-5 years if you are 30-22 years of age. ? Every year if you are 71 years old or older. Diabetes Have regular diabetes screenings. This checks your fasting blood sugar level. Have the screening done:  Once every three years after age 19 if you are at a normal weight and have a low risk for diabetes.  More often and at a younger age if you are overweight or have a high risk for diabetes. What should I know about preventing infection? Hepatitis B If you have a higher risk for hepatitis B, you should be screened for this virus. Talk with your health care provider to find out if you are at risk for hepatitis B infection. Hepatitis C Testing is recommended for:  Everyone born from [birth year] through 1963.  Anyone with known risk factors for hepatitis C. Sexually transmitted infections (STIs)  Get screened for STIs, including gonorrhea and chlamydia, if: ? You are sexually active and are younger than 67 years of age. ? You are older than 67 years of age and your health care provider tells you that you are at  risk for this type of infection. ? Your sexual activity has changed since you were last screened, and you are at increased risk for chlamydia or gonorrhea. Ask your health care provider if you are at risk.  Ask your health care provider about whether you are at high risk for HIV. Your health care provider may recommend a prescription medicine to help prevent HIV infection. If you choose to take medicine to prevent HIV, you should first get tested for HIV. You should  then be tested every 3 months for as long as you are taking the medicine. Pregnancy  If you are about to stop having your period (premenopausal) and you may become pregnant, seek counseling before you get pregnant.  Take 400 to 800 micrograms (mcg) of folic acid every day if you become pregnant.  Ask for birth control (contraception) if you want to prevent pregnancy. Osteoporosis and menopause Osteoporosis is a disease in which the bones lose minerals and strength with aging. This can result in bone fractures. If you are 61 years old or older, or if you are at risk for osteoporosis and fractures, ask your health care provider if you should:  Be screened for bone loss.  Take a calcium or vitamin D supplement to lower your risk of fractures.  Be given hormone replacement therapy (HRT) to treat symptoms of menopause. Follow these instructions at home: Lifestyle  Do not use any products that contain nicotine or tobacco, such as cigarettes, e-cigarettes, and chewing tobacco. If you need help quitting, ask your health care provider.  Do not use street drugs.  Do not share needles.  Ask your health care provider for help if you need support or information about quitting drugs. Alcohol use  Do not drink alcohol if: ? Your health care provider tells you not to drink. ? You are pregnant, may be pregnant, or are planning to become pregnant.  If you drink alcohol: ? Limit how much you use to 0-1 drink a day. ? Limit intake if you are breastfeeding.  Be aware of how much alcohol is in your drink. In the [place] one drink equals one 12 oz bottle of beer (355 mL), one 5 oz glass of wine (148 mL), or one 1 oz glass of hard liquor (44 mL). General instructions  Schedule regular health, dental, and eye exams.  Stay current with your vaccines.  Tell your health care provider if: ? You often feel depressed. ? You have ever been abused or do not feel safe at home. Summary  Adopting a  healthy lifestyle and getting preventive care are important in promoting health and wellness.  Follow your health care provider's instructions about healthy diet, exercising, and getting tested or screened for diseases.  Follow your health care provider's instructions on monitoring your cholesterol and blood pressure. This information is not intended to replace advice given to you by your health care provider. Make sure you discuss any questions you have with your health care provider. Document Revised: 06/01/2018 Document Reviewed: 06/01/2018 Elsevier Patient Education  2020 [employer].

## 2020-01-01 DIAGNOSIS — Z03818 Encounter for observation for suspected exposure to other biological agents ruled out: Secondary | ICD-10-CM | POA: Diagnosis not present

## 2020-01-01 DIAGNOSIS — Z20822 Contact with and (suspected) exposure to covid-19: Secondary | ICD-10-CM | POA: Diagnosis not present

## 2020-01-02 ENCOUNTER — Ambulatory Visit (INDEPENDENT_AMBULATORY_CARE_PROVIDER_SITE_OTHER): Payer: Medicare HMO | Admitting: Internal Medicine

## 2020-01-02 ENCOUNTER — Other Ambulatory Visit: Payer: Self-pay

## 2020-01-02 ENCOUNTER — Encounter: Payer: Self-pay | Admitting: Internal Medicine

## 2020-01-02 VITALS — BP 122/76 | HR 103 | Temp 98.6°F | Ht 67.0 in | Wt 148.6 lb

## 2020-01-02 DIAGNOSIS — R5383 Other fatigue: Secondary | ICD-10-CM | POA: Diagnosis not present

## 2020-01-02 DIAGNOSIS — R0602 Shortness of breath: Secondary | ICD-10-CM | POA: Diagnosis not present

## 2020-01-02 DIAGNOSIS — R202 Paresthesia of skin: Secondary | ICD-10-CM

## 2020-01-02 DIAGNOSIS — Z8616 Personal history of COVID-19: Secondary | ICD-10-CM | POA: Diagnosis not present

## 2020-01-02 DIAGNOSIS — H9313 Tinnitus, bilateral: Secondary | ICD-10-CM

## 2020-01-02 NOTE — Patient Instructions (Addendum)
Keep appt with ENT . Hearing  Screen today seems good .  Exam is reassuring but more evaluation  Chest x ray and pulmonary function test.  Although cardiac exam is good consider getting echo cardiogram or other  Testing .   Plan neurology consult about the  Paresthesia in legs feet and neck  Headaches .

## 2020-01-02 NOTE — Progress Notes (Signed)
Chief Complaint  Patient presents with  . Headache    SOB and fatigue, no energy, has a headache in frontal lobe and back of neck  . Tinnitus    started about 4-6 weeks ago  . Numbness    tingling in legs and feet, did have discomfort in lower back for the first    HPI: Heather Goodwin 69 y.o. come in for  number of concerns  But most concerning is fatigue  Feels worse than when she had covid in December January  had midl resp sx tast and smell change  And fatigue fever but no serious  Lower sx cough some  No hospitalization.   ? Sob   More frequent  Than before subtle    Tired and not like a reguarl exercise she has always done  Sleep no major change but some wakening's   No dx of osa .  Left foot  Wrapped   Around. Feeling and metarsl to toes feeling of  Pressure  Not pain bilaterally  And feel like wrapped around lower leg  No trauma  Walks a lot  Onset  Earlier in year   veins are fine  . ( had eval and rx)  Back head and front  Version of mild headache no assoc sx but over   And last 4-6 weeks ago .   Ringing  In ears  continuous  .   Both ears.    ? High pitched   Hearing  Ok   But has Community education officer.   For eval  Not until end August  Had covid vaccine pfizer  In may and second April 6   ROS: See pertinent positives and negatives per HPI. No fever  Cp perse  Syncope  No diarrhea   Past Medical History:  Diagnosis Date  . Beta thalassemia trait   . Foot fracture    at gym    Family History  Problem Relation Age of Onset  . Thyroid disease Mother        partial thyroidectomy  . Pancreatic cancer Mother 58  . Cirrhosis Father        secondary to chronic hepatitis b infection  . Other Son        B Thal trait  . Breast cancer Paternal Aunt 75  . Breast cancer Maternal Aunt 80  . Pancreatic cancer Maternal Grandfather        dx 75-80  . Melanoma Brother 27  . Colon cancer Neg Hx   . Esophageal cancer Neg Hx   . Rectal cancer Neg Hx     Social History    Socioeconomic History  . Marital status: Unknown    Spouse name: Not on file  . Number of children: 3  . Years of education: Not on file  . Highest education level: Not on file  Occupational History  . Occupation: Real Estate  Tobacco Use  . Smoking status: Never Smoker  . Smokeless tobacco: Never Used  Vaping Use  . Vaping Use: Never used  Substance and Sexual Activity  . Alcohol use: Yes    Alcohol/week: 1.0 - 4.0 standard drink    Types: 1 - 4 Glasses of wine per week    Comment: occ  . Drug use: No  . Sexual activity: Not on file  Other Topics Concern  . Not on file  Social History Narrative   Married   Regular exercise- yes   Real estate-Management rentalproperties   3  children   2-3 caffeine drinks daily   Patient is right handed.         Social Determinants of Health   Financial Resource Strain:   . Difficulty of Paying Living Expenses:   Food Insecurity:   . Worried About Charity fundraiser in the Last Year:   . Arboriculturist in the Last Year:   Transportation Needs:   . Film/video editor (Medical):   Marland Kitchen Lack of Transportation (Non-Medical):   Physical Activity:   . Days of Exercise per Week:   . Minutes of Exercise per Session:   Stress:   . Feeling of Stress :   Social Connections:   . Frequency of Communication with Friends and Family:   . Frequency of Social Gatherings with Friends and Family:   . Attends Religious Services:   . Active Member of Clubs or Organizations:   . Attends Archivist Meetings:   Marland Kitchen Marital Status:     Outpatient Medications Prior to Visit  Medication Sig Dispense Refill  . colestipol (COLESTID) 1 g tablet Take 1 tablet (1 g total) by mouth 2 (two) times daily. 180 tablet 1  . hydroquinone 4 % cream APPLY A SMALL AMOUNT TO SKIN ONCE A DAY    . Wheat Dextrin (BENEFIBER DRINK MIX PO) Take by mouth 3 (three) times daily. 3 teaspoons three times  daily     No facility-administered medications prior to  visit.     EXAM:  BP 122/76   Pulse (!) 103   Temp 98.6 F (37 C) (Temporal)   Ht 5\' 7"  (1.702 m)   Wt 148 lb 9.6 oz (67.4 kg)   SpO2 98%   BMI 23.27 kg/m   Body mass index is 23.27 kg/m.  GENERAL: vitals reviewed and listed above, alert, oriented, appears well hydrated and in no acute distress HEENT: atraumatic, conjunctiva  clear, no obvious abnormalities on inspection of external nose and ears tm clear see hearing screen ok OP : masked  NECK: no obvious masses on inspection palpation  LUNGS: clear to auscultation bilaterally, no wheezes, rales or rhonchi, good air movement CV: HRRR, no clubbing cyanosis or  peripheral edema nl cap refill  Abdomen:  Sof,t normal bowel sounds without hepatosplenomegaly, no guarding rebound or masses no CVA tenderness  MS: moves all extremities without noticeable focal  Abnormality  Sensory not fully tested  No lesin or callous and vascular nl  PSYCH: pleasant and cooperative, no obvious depression or anxiety Lab Results  Component Value Date   WBC 5.9 10/03/2019   HGB 12.4 10/03/2019   HCT 38.7 10/03/2019   PLT 222.0 10/03/2019   GLUCOSE 98 10/03/2019   CHOL 222 (H) 10/03/2019   TRIG 96.0 10/03/2019   HDL 70.50 10/03/2019   LDLDIRECT 123.1 03/02/2011   LDLCALC 132 (H) 10/03/2019   ALT 13 10/03/2019   AST 14 10/03/2019   NA 137 10/03/2019   K 4.0 10/03/2019   CL 101 10/03/2019   CREATININE 0.73 10/03/2019   BUN 13 10/03/2019   CO2 30 10/03/2019   TSH 1.85 10/03/2019   BP Readings from Last 3 Encounters:  01/02/20 122/76  10/03/19 116/78  09/15/19 125/87    Hearing Screening   Method: Audiometry   125Hz  250Hz  500Hz  1000Hz  2000Hz  3000Hz  4000Hz  6000Hz  8000Hz   Right ear:   Pass Pass Pass Fail Pass    Left ear:   Pass Pass Pass Fail Pass    pass means 20 db  Fail was 30 - 40 db   ASSESSMENT AND PLAN:  Discussed the following assessment and plan:  Shortness of breath - subtle finding by hx  - Plan: DG Chest 2 View,  Pulmonary Function Test  Paresthesia of both feet - Plan: Ambulatory referral to Neurology  Other fatigue  Tinnitus aurium, bilateral - grossly nl hearing screen  - Plan: DG Chest 2 View, Ambulatory referral to Neurology  Personal history of covid-19 - also had vaccine  2 doses  - Plan: Pulmonary Function Test, Ambulatory referral to Neurology generally very well all sx seem to be onset after covid but not immediately   But tinnitus came  after vaccine.  Not certain if related at all but cannot  Put all together  But wold value further neuro evaluation .  cxray  pfts  Consider   Echo cardiogram or cards exercise evaluation -Patient advised to return or notify health care team  if  new concerns arise.  Patient Instructions  Keep appt with ENT . Hearing  Screen today seems good .  Exam is reassuring but more evaluation  Chest x ray and pulmonary function test.  Although cardiac exam is good consider getting echo cardiogram or other  Testing .   Plan neurology consult about the  Paresthesia in legs feet and neck  Headaches .       Standley Brooking. Shia Eber M.D.

## 2020-01-03 ENCOUNTER — Ambulatory Visit (INDEPENDENT_AMBULATORY_CARE_PROVIDER_SITE_OTHER)
Admission: RE | Admit: 2020-01-03 | Discharge: 2020-01-03 | Disposition: A | Discharge status: Home / Self Care | Payer: Medicare HMO | Source: Ambulatory Visit | Attending: Internal Medicine | Admitting: Internal Medicine

## 2020-01-03 DIAGNOSIS — R0602 Shortness of breath: Secondary | ICD-10-CM | POA: Diagnosis not present

## 2020-01-03 DIAGNOSIS — J90 Pleural effusion, not elsewhere classified: Secondary | ICD-10-CM | POA: Diagnosis not present

## 2020-01-03 DIAGNOSIS — H9313 Tinnitus, bilateral: Secondary | ICD-10-CM | POA: Diagnosis not present

## 2020-01-03 NOTE — Progress Notes (Signed)
C xray is normal  that is good

## 2020-01-23 DIAGNOSIS — H903 Sensorineural hearing loss, bilateral: Secondary | ICD-10-CM | POA: Diagnosis not present

## 2020-01-23 DIAGNOSIS — H9190 Unspecified hearing loss, unspecified ear: Secondary | ICD-10-CM | POA: Diagnosis not present

## 2020-01-23 DIAGNOSIS — H9313 Tinnitus, bilateral: Secondary | ICD-10-CM | POA: Diagnosis not present

## 2020-01-31 ENCOUNTER — Ambulatory Visit: Payer: Medicare HMO | Admitting: Gastroenterology

## 2020-01-31 ENCOUNTER — Encounter: Payer: Self-pay | Admitting: Gastroenterology

## 2020-01-31 VITALS — BP 100/60 | HR 96 | Ht 66.25 in | Wt 149.0 lb

## 2020-01-31 DIAGNOSIS — M6289 Other specified disorders of muscle: Secondary | ICD-10-CM | POA: Diagnosis not present

## 2020-01-31 DIAGNOSIS — K580 Irritable bowel syndrome with diarrhea: Secondary | ICD-10-CM

## 2020-01-31 DIAGNOSIS — K9089 Other intestinal malabsorption: Secondary | ICD-10-CM | POA: Diagnosis not present

## 2020-01-31 NOTE — Progress Notes (Signed)
Heather Goodwin    [id number]    17-Jul-1950  Primary Care Physician:Panosh, Standley Brooking, MD  Referring Physician: Burnis Medin, MD [place],  [place] [address]   Chief complaint: IBS-diarrhea HPI:  69 year old very pleasant female here for follow-up visit for  IBS-diarrhea.  No fecal incontinence episodes in the past year and half  Overall better but continues to have semiformed stool and fragmented defecation, sometimes she does not feel she is completely evacuating and has 2-3 bowel movements within an hour.  On average she is having 1-2 bowel movements daily. He is taking Benefiber twice daily and Colestid 1 g daily  Denies any nausea, vomiting, abdominal pain, melena or bright red blood per rectum  Colonoscopy in 2006: Normal  Colonoscopy 2012 with hyperplastic rectal polyp and internal hemorrhoids otherwise normal exam  Outpatient Encounter Medications as of 01/31/2020  Medication Sig  . colestipol (COLESTID) 1 g tablet Take 1 tablet (1 g total) by mouth 2 (two) times daily. (Patient taking differently: Take 1 g by mouth daily. )  . hydroquinone 4 % cream APPLY A SMALL AMOUNT TO SKIN ONCE A DAY  . Wheat Dextrin (BENEFIBER DRINK MIX PO) Take by mouth in the morning and at bedtime. 3 teaspoons two times  daily   No facility-administered encounter medications on file as of 01/31/2020.    Allergies as of 01/31/2020 - Review Complete 01/02/2020  Allergen Reaction Noted  . No known allergies  09/15/2019    Past Medical History:  Diagnosis Date  . Beta thalassemia trait   . Foot fracture    at gym    Past Surgical History:  Procedure Laterality Date  . left knee surgery  2009   meniscus   . US ECHOCARDIOGRAPHY  2005   normal for syncope     Family History  Problem Relation Age of Onset  . Thyroid disease Mother        partial thyroidectomy  . Pancreatic cancer Mother 61  . Cirrhosis Father        secondary to chronic hepatitis  b infection  . Other Son        B Thal trait  . Breast cancer Paternal Aunt 41  . Breast cancer Maternal Aunt 80  . Pancreatic cancer Maternal Grandfather        dx 75-80  . Melanoma Brother 32  . Colon cancer Neg Hx   . Esophageal cancer Neg Hx   . Rectal cancer Neg Hx     Social History   Socioeconomic History  . Marital status: Unknown    Spouse name: Not on file  . Number of children: 3  . Years of education: Not on file  . Highest education level: Not on file  Occupational History  . Occupation: Real Estate  Tobacco Use  . Smoking status: Never Smoker  . Smokeless tobacco: Never Used  Vaping Use  . Vaping Use: Never used  Substance and Sexual Activity  . Alcohol use: Yes    Alcohol/week: 1.0 - 4.0 standard drink    Types: 1 - 4 Glasses of wine per week    Comment: occ  . Drug use: No  . Sexual activity: Not on file  Other Topics Concern  . Not on file  Social History Narrative   Married   Regular exercise- yes   Real estate-Management rentalproperties   3 children   2-3 caffeine drinks daily  Patient is right handed.         Social Determinants of Health   Financial Resource Strain:   . Difficulty of Paying Living Expenses:   Food Insecurity:   . Worried About Charity fundraiser in the Last Year:   . Arboriculturist in the Last Year:   Transportation Needs:   . Film/video editor (Medical):   Marland Kitchen Lack of Transportation (Non-Medical):   Physical Activity:   . Days of Exercise per Week:   . Minutes of Exercise per Session:   Stress:   . Feeling of Stress :   Social Connections:   . Frequency of Communication with Friends and Family:   . Frequency of Social Gatherings with Friends and Family:   . Attends Religious Services:   . Active Member of Clubs or Organizations:   . Attends Archivist Meetings:   Marland Kitchen Marital Status:   Intimate Partner Violence:   . Fear of Current or Ex-Partner:   . Emotionally Abused:   Marland Kitchen Physically Abused:    . Sexually Abused:       Review of systems: All other review of systems negative except as mentioned in the HPI.   Physical Exam: Vitals:   01/31/20 0818  BP: 100/60  Pulse: 96   Body mass index is 23.87 kg/m. Gen:      No acute distress HEENT:  sclera anicteric Neuro: alert and oriented x 3 Psych: normal mood and affect  Data Reviewed:  Reviewed labs, radiology imaging, old records and pertinent past GI work up   Assessment and Plan/Recommendations: 69 year old very pleasant female  IBS predominant diarrhea  IBS diarrhea : Increase Benefiber to 3 times daily Continue with high-fiber diet and increase water intake Reassured her that sometimes with diet that is rich in fruits and vegetables can lead to semiformed stool but if she is not having watery stools or episodes of fecal incontinence it is fine to continue with her current diet  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  She completed pelvic floor physical therapy last year She will benefit from additional 1 or 2 sessions of physical therapy to refresh, will send the referral  Return in 1 year or sooner if needed  This visit required >40 minutes of patient care (this includes precharting, chart review, review of results, face-to-face time used for counseling as well as treatment plan and follow-up. The patient was provided an opportunity to ask questions and all were answered. The patient agreed with the plan and demonstrated an understanding of the instructions.  [name] , MD    CC: Panosh, Standley Brooking, MD

## 2020-01-31 NOTE — Patient Instructions (Signed)
Have referred you to Physical Therapy to see Earlie Counts, They will contact you with that appointment   Increase Benefiber to three times a day  Continue with Colestid once daily and titrate up or down based on response  Follow up in 1 year  If you are age 67 or older, your body mass index should be between 23-30. Your Body mass index is 23.87 kg/m. If this is out of the aforementioned range listed, please consider follow up with your Primary Care Provider.  If you are age 5 or younger, your body mass index should be between 19-25. Your Body mass index is 23.87 kg/m. If this is out of the aformentioned range listed, please consider follow up with your Primary Care Provider.    I appreciate the  opportunity to care for you  Thank You   Harl Bowie , MD

## 2020-02-21 DIAGNOSIS — R69 Illness, unspecified: Secondary | ICD-10-CM | POA: Diagnosis not present

## 2020-03-05 ENCOUNTER — Encounter: Payer: Self-pay | Admitting: Neurology

## 2020-03-05 ENCOUNTER — Ambulatory Visit (INDEPENDENT_AMBULATORY_CARE_PROVIDER_SITE_OTHER): Payer: Medicare HMO | Admitting: Neurology

## 2020-03-05 VITALS — BP 105/68 | HR 92 | Ht 66.0 in | Wt 147.0 lb

## 2020-03-05 DIAGNOSIS — R202 Paresthesia of skin: Secondary | ICD-10-CM | POA: Diagnosis not present

## 2020-03-05 NOTE — Progress Notes (Signed)
Reason for visit: Altered sensation of the legs  Referring physician: Dr. Grandville [name] is a 69 y.o. 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  The patient apparently has done well since that time but she sustained a Covid virus infection in December 2020.  By January 2021, the patient had begun to note some problems with fatigue, shortness of breath that has gotten some better.  She still feels some generalized fatigue now.  She began having a tight sensation around the ankle and lower legs that will come and go, and is possibly a little bit more prominent on the left leg than the right.  She denies any true numbness of the feet but she has a sensation that there is thickening of the skin and on the ball of the foot bilaterally.  The patient denies any discomfort in the feet at night.  She has no back pain or pain down the legs.  She has had about 5 years of some itching sensations in the back area.  She denies any significant neck pain, she was having some occipital headaches previously but this has improved.  She has no dizziness or vision changes.  She has had blood work done recently that includes a thyroid profile and a B12 level that were unremarkable.  She denies issues controlling the bladder but she has had some fecal incontinence on occasion, she is now on medication to prevent this.  She is sent to this office for an evaluation.  She has not noted any change in balance, she remains quite active.  Past Medical History:  Diagnosis Date  . Beta thalassemia trait   . Foot fracture    at gym    Past Surgical History:  Procedure Laterality Date  . left knee surgery  2009    meniscus   . US ECHOCARDIOGRAPHY  2005   normal for syncope     Family History  Problem Relation Age of Onset  . Thyroid disease Mother        partial thyroidectomy  . Pancreatic cancer Mother 80  . Cirrhosis Father        secondary to chronic hepatitis b infection  . Other Son        B Thal trait  . Breast cancer Paternal Aunt 45  . Breast cancer Maternal Aunt 80  . Pancreatic cancer Maternal Grandfather        dx 75-80  . Melanoma Brother 36  . Colon cancer Neg Hx   . Esophageal cancer Neg Hx   . Rectal cancer Neg Hx     Social history:  reports that she has never smoked. She has never used smokeless tobacco. She reports current alcohol use of about 1.0 - 4.0 standard drink of alcohol per week. She reports that she does not use drugs.  Medications:  Prior to Admission medications   Medication Sig Start Date End Date Taking? Authorizing Provider  colestipol (COLESTID) 1 g tablet Take 1 tablet (1 g total) by mouth 2 (two) times daily. Patient taking differently: Take 1 g by mouth daily.  05/17/19  Yes Nandigam, Venia Minks, MD  hydroquinone 4 % cream  APPLY A SMALL AMOUNT TO SKIN ONCE A DAY 09/14/19  Yes [provider]  Wheat Dextrin (BENEFIBER DRINK MIX PO) Take by mouth in the morning and at bedtime. 3 teaspoons two times  daily   Yes [provider]      Allergies  Allergen Reactions  . No Known Allergies     ROS:  Out of a complete 14 system review of symptoms, the patient complains only of the following symptoms, and all other reviewed systems are negative.  Squeezing sensations of the feet and lower legs Fatigue  Height 5\' 6"  (1.676 m), weight 147 lb (66.7 kg).  Physical Exam  General: The patient is alert and cooperative at the time of the examination.  Eyes: Pupils are equal, round, and reactive to light. Discs are flat bilaterally.  Neck: The neck is supple, no carotid bruits are noted.  Respiratory: The respiratory examination is  clear.  Cardiovascular: The cardiovascular examination reveals a regular rate and rhythm, no obvious murmurs or rubs are noted.  Skin: Extremities are without significant edema.  Neurologic Exam  Mental status: The patient is alert and oriented x 3 at the time of the examination. The patient has apparent normal recent and remote memory, with an apparently normal attention span and concentration ability.  Cranial nerves: Facial symmetry is present. There is good sensation of the face to pinprick and soft touch bilaterally. The strength of the facial muscles and the muscles to head turning and shoulder shrug are normal bilaterally. Speech is well enunciated, no aphasia or dysarthria is noted. Extraocular movements are full. Visual fields are full. The tongue is midline, and the patient has symmetric elevation of the soft palate. No obvious hearing deficits are noted.  Motor: The motor testing reveals 5 over 5 strength of all 4 extremities. Good symmetric motor tone is noted throughout.  Sensory: Sensory testing is intact to pinprick, soft touch, vibration sensation, and position sense on all 4 extremities. No evidence of extinction is noted.  Coordination: Cerebellar testing reveals good finger-nose-finger and heel-to-shin bilaterally.  Gait and station: Gait is normal. Tandem gait is normal. Romberg is negative. No drift is seen.  Reflexes: Deep tendon reflexes are symmetric and normal bilaterally, with exception of depression of the absence of ankle jerk reflexes bilaterally. Toes are downgoing bilaterally.   Assessment/Plan:  1.  Subjective sensory alteration, lower extremities  The patient began having some alteration in sensation of the lower legs following the Covid virus infection in December 2020.  The neurologic examination is relatively unremarkable, the patient has no definite evidence of a neuropathy on clinical examination.  The patient will be set up for nerve conduction  studies of both legs and EMG on the left leg.  If there is evidence of an underlying neuropathy, further blood work will be done.  Patients may have sensory alteration following the Covid infection, but this is usually a very transient symptom.  Jill Alexanders MD 03/05/2020 8:29 AM  [facility] [address] [place], [place] [address]  Phone [phone] Fax [phone]

## 2020-03-06 DIAGNOSIS — R69 Illness, unspecified: Secondary | ICD-10-CM | POA: Diagnosis not present

## 2020-03-13 DIAGNOSIS — R69 Illness, unspecified: Secondary | ICD-10-CM | POA: Diagnosis not present

## 2020-03-21 ENCOUNTER — Ambulatory Visit: Payer: Medicare HMO | Admitting: Physical Therapy

## 2020-03-28 DIAGNOSIS — L988 Other specified disorders of the skin and subcutaneous tissue: Secondary | ICD-10-CM | POA: Diagnosis not present

## 2020-03-28 DIAGNOSIS — L814 Other melanin hyperpigmentation: Secondary | ICD-10-CM | POA: Diagnosis not present

## 2020-03-28 DIAGNOSIS — R202 Paresthesia of skin: Secondary | ICD-10-CM | POA: Diagnosis not present

## 2020-03-28 DIAGNOSIS — L821 Other seborrheic keratosis: Secondary | ICD-10-CM | POA: Diagnosis not present

## 2020-03-28 DIAGNOSIS — D229 Melanocytic nevi, unspecified: Secondary | ICD-10-CM | POA: Diagnosis not present

## 2020-03-28 DIAGNOSIS — R69 Illness, unspecified: Secondary | ICD-10-CM | POA: Diagnosis not present

## 2020-04-01 ENCOUNTER — Ambulatory Visit: Payer: Medicare HMO | Admitting: Neurology

## 2020-04-01 ENCOUNTER — Encounter: Payer: Self-pay | Admitting: Neurology

## 2020-04-01 ENCOUNTER — Ambulatory Visit (INDEPENDENT_AMBULATORY_CARE_PROVIDER_SITE_OTHER): Payer: Medicare HMO | Admitting: Neurology

## 2020-04-01 DIAGNOSIS — G603 Idiopathic progressive neuropathy: Secondary | ICD-10-CM

## 2020-04-01 DIAGNOSIS — R202 Paresthesia of skin: Secondary | ICD-10-CM | POA: Diagnosis not present

## 2020-04-01 DIAGNOSIS — G629 Polyneuropathy, unspecified: Secondary | ICD-10-CM

## 2020-04-01 HISTORY — DX: Polyneuropathy, unspecified: G62.9

## 2020-04-01 NOTE — Procedures (Signed)
     HISTORY:  Heather Goodwin is a 69 year old patient with a history of a prior Covid infection in December 2020, following this she has noted some sensory alterations around the ankles on both sides, she has been evaluated for this issue.  She reports no weakness or balance changes.  NERVE CONDUCTION STUDIES:  Nerve conduction studies were performed on both lower extremities.  The distal motor latencies for the peroneal nerves were normal bilaterally with low motor amplitude seen for these nerves.  The distal motor latencies for the posterior tibial nerves were normal on the left and prolonged on the right with a low motor amplitude on the left, normal on the right.  Slowing was seen for the posterior tibial nerves bilaterally, borderline normal for the peroneal nerves bilaterally.  The sensory latencies for the sural and peroneal nerves were borderline normal or normal bilaterally.  The F-wave latencies for the posterior tibial nerves were prolonged bilaterally.  EMG STUDIES:  EMG study was performed on the left lower extremity:  The tibialis anterior muscle reveals 2 to 4K motor units with full recruitment. No fibrillations or positive waves were seen. The peroneus tertius muscle reveals 2 to 4K motor units with full recruitment. No fibrillations or positive waves were seen. The medial gastrocnemius muscle reveals 1 to 3K motor units with full recruitment. No fibrillations or positive waves were seen. The vastus lateralis muscle reveals 2 to 4K motor units with full recruitment. No fibrillations or positive waves were seen. The iliopsoas muscle reveals 2 to 4K motor units with full recruitment. No fibrillations or positive waves were seen. The biceps femoris muscle (long head) reveals 2 to 4K motor units with full recruitment. No fibrillations or positive waves were seen. The lumbosacral paraspinal muscles were tested at 3 levels, and revealed no abnormalities of insertional activity at all  3 levels tested. There was good relaxation.   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  EMG evaluation of the left lower extremity was relatively unremarkable, without evidence of an overlying lumbosacral radiculopathy.  Jill Alexanders MD 04/01/2020 1:39 PM  [facility] [address] [place], [place] [address]  Phone [phone] Fax [phone]

## 2020-04-01 NOTE — Progress Notes (Signed)
Please refer to EMG and nerve conduction procedure note.  

## 2020-04-01 NOTE — Progress Notes (Addendum)
The patient comes in today for EMG and nerve conduction study evaluation.  The study shows some primarily motor slowing in the lower extremities consistent with neuropathy.  The study suggests an axonal process, not demyelinating.  EMG of the left leg is essentially normal, there is no evidence of an overlying lumbosacral radiculopathy.  We will check blood work today looking for any sources of the neuropathy.  The patient believes that the sensory alterations occurred after a Covid virus infection in December 2020.     [facility]    Nerve / Sites Muscle Latency Ref. Amplitude Ref. Rel Amp Segments Distance Velocity Ref. Area    ms ms mV mV %  cm m/s m/s mVms  L Peroneal - EDB     Ankle EDB 6.5 ?6.5 1.2 ?2.0 100 Ankle - EDB 9   5.3     Fib head EDB 12.9  1.2  95.4 Fib head - Ankle 28 44 ?44 5.8     Pop fossa EDB 15.1  1.2  102 Pop fossa - Fib head 10 44 ?44 6.1         Pop fossa - Ankle      R Peroneal - EDB     Ankle EDB 5.9 ?6.5 1.9 ?2.0 100 Ankle - EDB 9   7.4     Fib head EDB 12.5  1.7  91.2 Fib head - Ankle 29 44 ?44 7.9     Pop fossa EDB 14.8  1.8  102 Pop fossa - Fib head 10 44 ?44 7.5         Pop fossa - Ankle      L Tibial - AH     Ankle AH 5.8 ?5.8 3.9 ?4.0 100 Ankle - AH 9   9.5     Pop fossa AH 15.1  2.6  67.4 Pop fossa - Ankle 35 37 ?41 7.6  R Tibial - AH     Ankle AH 6.6 ?5.8 4.4 ?4.0 100 Ankle - AH 9   10.8     Pop fossa AH 16.6  3.2  73.8 Pop fossa - Ankle 37 37 ?41 10.3             SNC    Nerve / Sites Rec. Site Peak Lat Ref.  Amp Ref. Segments Distance    ms ms V V  cm  L Sural - Ankle (Calf)     Calf Ankle 4.4 ?4.4 7 ?6 Calf - Ankle 14  R Sural - Ankle (Calf)     Calf Ankle 4.3 ?4.4 6 ?6 Calf - Ankle 14  L Superficial peroneal - Ankle     Lat leg Ankle 4.4 ?4.4 2 ?6 Lat leg - Ankle 14  R Superficial peroneal - Ankle     Lat leg Ankle 4.2 ?4.4 4 ?6 Lat leg - Ankle 14             F  Wave    Nerve F Lat Ref.   ms ms  L Tibial - AH 59.4 ?56.0  R Tibial - AH  67.9 ?56.0

## 2020-04-03 ENCOUNTER — Encounter: Payer: Self-pay | Admitting: Physical Therapy

## 2020-04-03 ENCOUNTER — Ambulatory Visit: Payer: Medicare HMO | Attending: Gastroenterology | Admitting: Physical Therapy

## 2020-04-03 ENCOUNTER — Other Ambulatory Visit: Payer: Self-pay

## 2020-04-03 DIAGNOSIS — R69 Illness, unspecified: Secondary | ICD-10-CM | POA: Diagnosis not present

## 2020-04-03 DIAGNOSIS — M6281 Muscle weakness (generalized): Secondary | ICD-10-CM | POA: Insufficient documentation

## 2020-04-03 DIAGNOSIS — R159 Full incontinence of feces: Secondary | ICD-10-CM | POA: Diagnosis not present

## 2020-04-03 DIAGNOSIS — R278 Other lack of coordination: Secondary | ICD-10-CM

## 2020-04-03 NOTE — Therapy (Signed)
[facility] [address], [place], [place], [address] Phone: [phone]   Fax:  [phone]  Physical Therapy Evaluation  Patient Details  Name: Heather Goodwin MRN: [id number] Date of Birth: [birth year] Referring Provider (PT): Dr. Harl Bowie   Encounter Date: 04/03/2020   PT End of Session - 04/03/20 1018    Visit Number 1    Date for PT Re-Evaluation 06/12/20    Authorization Type [employer]na Medicare    PT Start Time 0800    PT Stop Time 0845    PT Time Calculation (min) 45 min    Activity Tolerance Patient tolerated treatment well    Behavior During Therapy [facility] for tasks assessed/performed           Past Medical History:  Diagnosis Date  . Beta thalassemia trait   . Foot fracture    at gym  . Peripheral neuropathy 04/01/2020    Past Surgical History:  Procedure Laterality Date  . left knee surgery  2009   meniscus   . US ECHOCARDIOGRAPHY  2005   normal for syncope     There were no vitals filed for this visit.    Subjective Assessment - 04/03/20 0808    Subjective Patient is on the same prescription. she wants to get off the [place]. MD said she is able to reduce the medication and do the Benefiber 3x per day. Patient is in the bathroom 3 times per day. Presently Type 4 stool.Since August going to the bathroom 1 time and sometimes 2x. When has the urge to go she will do the exercises to hold the stool but not able to hold longer period of time.    Patient Stated Goals being educated to exercise to hold the urge back when have a bowel movement, be able to hold the bowel movement until she has a bathroom she feels comfortable to use    Currently in Pain? No/denies              [facility] PT Assessment - 04/03/20 0001      Assessment   Medical Diagnosis M62.89 Pelvic floor dysfunction    Referring Provider (PT) Dr. Harl Bowie    Onset Date/Surgical Date --   chronic   Prior Therapy  yes      Precautions   Precautions None      Restrictions   Weight Bearing Restrictions No      Balance Screen   Has the patient fallen in the past 6 months No    Has the patient had a decrease in activity level because of a fear of falling?  No    Is the patient reluctant to leave their home because of a fear of falling?  No      Home Ec[employer] residence      Prior Function   Level of Independence Independent    Leisure hiking, tennis, walk 20 miles      Cognition   Overall Cognitive Status Within Functional Limits for tasks assessed      Posture/Postural Control   Posture/Postural Control No significant limitations      ROM / Strength   AROM / PROM / Strength AROM;PROM;Strength      AROM   Overall AROM Comments lumbar ROM is full                      Objective measurements completed on examination: See above findings.  Pelvic Floor Special Questions - 04/03/20 0001    Urinary Leakage No    Urinary urgency Yes   with bowel movement   Fecal incontinence No    Skin Integrity Intact    Exam Type Deferred   did the pelvic floor EMG instead   Biofeedback sidely resting tone 1.3 uv; single quick flick 55 uv; 5 sec 35 uv; 15 sec holding at 35 uv then will fatique and quit, stanidng reseting tone 1 uv; 5 sec in standing is 10 uv    Biofeedback sensor type Rectal   anal                    PT Education - 04/03/20 0959    Education Details Access Code: [email or web address]    Person(s) Educated Patient    Methods Explanation;Demonstration;Verbal cues;Handout    Comprehension Returned demonstration;Verbalized understanding            PT Short Term Goals - 04/03/20 1111      PT SHORT TERM GOAL #1   Title independent with initial HEP    Time 4    Period Weeks    Status New    Target Date 05/01/20      PT SHORT TERM GOAL #2   Title ---      PT SHORT TERM GOAL #3   Title ---             PT Long Term Goals -  04/03/20 1111      PT LONG TERM GOAL #1   Title Independent with HEP and understands how to progress herself    Baseline --    Time 10    Period Weeks    Status New    Target Date 06/12/20      PT LONG TERM GOAL #2   Title able to contract her pelvic floor for 20 seconds above 25 uv due to increased pelvic floor strength and endurance    Time 10    Period Weeks    Status New    Target Date 06/12/20      PT LONG TERM GOAL #3   Title when she has the urge to have a bowel movement, able to hold for 30-45 minutes to get to her commode    Baseline --    Time 10    Period Weeks    Status New    Target Date 06/12/20                  Plan - 04/03/20 0959    Clinical Impression Statement Patient is a 69 year old female with difficulty holding her bowel movements when she has the urge to get to a bathroom when convient. Patient is not having fecal or urinary leakage. Her resting tone of the pelvic floor is 1uv. She is able to contract to 15 sec. in sidely before fatique at 35 uv. Her quick flick in sidely is 55 uv. When in standing she is only able to contract for 5 sec at 10 uv. Patient has more difficulty with contracting her pelvic floor in standing which correlates with difficulty holding the urge for a bowel movement in standing more difficult. Patient will benefit from skilled therapy to improve pelvic floor strength and endurance to hold a bowel movement to get to a commode.    Examination-Activity Limitations Toileting    Examination-Participation Restrictions Community Activity    Stability/Clinical Decision Making Stable/Uncomplicated    Clinical Decision Making Low  Rehab Potential Excellent    PT Frequency 1x / week    PT Duration --   10 weeks   PT Treatment/Interventions Biofeedback;Therapeutic exercise;Neuromuscular re-education;Therapeutic activities;Patient/family education    PT Next Visit Plan work with pelvic floor EMG in standing and with core exercises     PT Home Exercise Plan Access Code: [email or web address]    Consulted and Agree with Plan of Care Patient           Patient will benefit from skilled therapeutic intervention in order to improve the following deficits and impairments:  Decreased coordination, Decreased activity tolerance, Decreased endurance, Decreased strength  Visit Diagnosis: Muscle weakness (generalized)  Other lack of coordination     Problem List Patient Active Problem List   Diagnosis Date Noted  . Peripheral neuropathy 04/01/2020  . Genetic testing 12/20/2018  . Family history of breast cancer   . Family history of melanoma   . Paresthesia of left leg 02/13/2015  . Osteopenia 10/06/2013  . Visit for preventive health examination 10/06/2013  . Fatigue 03/02/2011  . Family history of pancreatic cancer 03/02/2011  . OTHER DYSPHAGIA 12/31/2009  . OTHER THALASSEMIA 09/23/2007    Earlie Counts, PT 04/03/20 11:15 AM   [facility] [address], [place], [place], [address] Phone: [phone]   Fax:  [phone]  Name: AMARACHUKWU LAKATOS MRN: [id number] Date of Birth: Jan 06, 1951

## 2020-04-03 NOTE — Patient Instructions (Signed)
Access Code: [email or web address] URL: [email or web address] Date: 04/03/2020 Prepared by: Earlie Counts  Exercises Sidelying Pelvic Floor Contraction with Self-Palpation - 2 x daily - 7 x weekly - 1 sets - 10 reps - 20 sec hold Standing Pelvic Floor Contraction - 2 x daily - 7 x weekly - 1 sets - 10 reps - 5 sec hold [facility] [address], [place], [place] [address] Phone # [phone] Fax [phone]

## 2020-04-04 LAB — MULTIPLE MYELOMA PANEL, SERUM
Albumin SerPl Elph-Mcnc: 4.1 g/dL (ref 2.9–4.4)
Albumin/Glob SerPl: 1.5 (ref 0.7–1.7)
Alpha 1: 0.2 g/dL (ref 0.0–0.4)
Alpha2 Glob SerPl Elph-Mcnc: 0.6 g/dL (ref 0.4–1.0)
B-Globulin SerPl Elph-Mcnc: 1 g/dL (ref 0.7–1.3)
Gamma Glob SerPl Elph-Mcnc: 1 g/dL (ref 0.4–1.8)
Globulin, Total: 2.8 g/dL (ref 2.2–3.9)
IgA/Immunoglobulin A, Serum: 269 mg/dL (ref 87–352)
IgG (Immunoglobin G), Serum: 929 mg/dL (ref 586–1602)
IgM (Immunoglobulin M), Srm: 120 mg/dL (ref 26–217)
Total Protein: 6.9 g/dL (ref 6.0–8.5)

## 2020-04-04 LAB — ANGIOTENSIN CONVERTING ENZYME: Angio Convert Enzyme: 37 U/L (ref 14–82)

## 2020-04-04 LAB — ANA W/REFLEX: Anti Nuclear Antibody (ANA): NEGATIVE

## 2020-04-04 LAB — SEDIMENTATION RATE: Sed Rate: 2 mm/hr (ref 0–40)

## 2020-04-04 LAB — COPPER, SERUM: Copper: 102 ug/dL (ref 80–158)

## 2020-04-04 LAB — B. BURGDORFI ANTIBODIES: Lyme IgG/IgM Ab: <0.91 {ISR} (ref 0.00–0.90)

## 2020-04-10 ENCOUNTER — Encounter: Payer: Medicare HMO | Admitting: Physical Therapy

## 2020-04-17 ENCOUNTER — Encounter: Payer: Self-pay | Admitting: Physical Therapy

## 2020-04-17 ENCOUNTER — Other Ambulatory Visit: Payer: Self-pay

## 2020-04-17 ENCOUNTER — Ambulatory Visit: Payer: Medicare HMO | Admitting: Physical Therapy

## 2020-04-17 DIAGNOSIS — M6281 Muscle weakness (generalized): Secondary | ICD-10-CM | POA: Diagnosis not present

## 2020-04-17 DIAGNOSIS — R159 Full incontinence of feces: Secondary | ICD-10-CM | POA: Diagnosis not present

## 2020-04-17 DIAGNOSIS — R69 Illness, unspecified: Secondary | ICD-10-CM | POA: Diagnosis not present

## 2020-04-17 DIAGNOSIS — R278 Other lack of coordination: Secondary | ICD-10-CM

## 2020-04-17 NOTE — Patient Instructions (Signed)
Access Code: [email or web address] URL: [email or web address] Date: 04/17/2020 Prepared by: Earlie Counts  Exercises Standing Pelvic Floor Contraction - 2 x daily - 7 x weekly - 1 sets - 10 reps - 5 sec hold Seated Pelvic Floor Contraction - 2 x daily - 7 x weekly - 1 sets - 10 reps - 30 hold [facility] [address], [place], [place] [address] Phone # [phone] Fax [phone]

## 2020-04-17 NOTE — Therapy (Signed)
[facility] [address], [place], [place], [address] Phone: [phone]   Fax:  [phone]  Physical Therapy Treatment  Patient Details  Name: Heather Goodwin MRN: [id number] Date of Birth: 23-Jan-1951 Referring Provider (PT): Dr. Harl Bowie   Encounter Date: 04/17/2020   PT End of Session - 04/17/20 0839    Visit Number 2    Date for PT Re-Evaluation 06/12/20    Authorization Type A[employer]na Medicare    PT Start Time 0800    PT Stop Time 0832    PT Time Calculation (min) 32 min    Activity Tolerance Patient tolerated treatment well    Behavior During Therapy [facility] for tasks assessed/performed           Past Medical History:  Diagnosis Date  . Beta thalassemia trait   . Foot fracture    at gym  . Peripheral neuropathy 04/01/2020    Past Surgical History:  Procedure Laterality Date  . left knee surgery  2009   meniscus   . US ECHOCARDIOGRAPHY  2005   normal for syncope     There were no vitals filed for this visit.   Subjective Assessment - 04/17/20 0803    Subjective I did not do the exercises and they did not work for me.    Patient Stated Goals being educated to exercise to hold the urge back when have a bowel movement, be able to hold the bowel movement until she has a bathroom she feels comfortable to use    Currently in Pain? No/denies    Multiple Pain Sites No                          Pelvic Floor Special Questions - 04/17/20 0001    Biofeedback sitting contract to 15 sec at 20 sec, sitting contracting to 30 sec with counting out loud and feel when she relaxes; standing contracting the pelvic floor between 5 and 10 sec.     Biofeedback sensor type Rectal   anal             [facility] Adult PT Treatment/Exercise - 04/17/20 0001      Self-Care   Self-Care Other Self-Care Comments    Other Self-Care Comments  discussed with patient what exercise style works for her and makes  it easier for her to incorporate them in the day                  PT Education - 04/17/20 0838    Education Details Access Code: [email or web address]    Person(s) Educated Patient    Methods Explanation;Demonstration;Verbal cues;Handout    Comprehension Verbalized understanding;Returned demonstration            PT Short Term Goals - 04/17/20 1025      PT SHORT TERM GOAL #1   Title independent with initial HEP    Time 4    Period Weeks    Status On-going             PT Long Term Goals - 04/03/20 1111      PT LONG TERM GOAL #1   Title Independent with HEP and understands how to progress herself    Baseline --    Time 10    Period Weeks    Status New    Target Date 06/12/20      PT LONG TERM GOAL #2   Title able to contract her  pelvic floor for 20 seconds above 25 uv due to increased pelvic floor strength and endurance    Time 10    Period Weeks    Status New    Target Date 06/12/20      PT LONG TERM GOAL #3   Title when she has the urge to have a bowel movement, able to hold for 30-45 minutes to get to her commode    Baseline --    Time 10    Period Weeks    Status New    Target Date 06/12/20                 Plan - 04/17/20 8003    Clinical Impression Statement Patient had difficulty with her exercises due to not being convient during her day. Patient does better with exercises that are in sitting and standing. Patient is able to contract for 30 seconds in sitting above 20 uv with several dips due to taking a breath. Patient is able to contract in standing for 5 seconds above 15 uv. Patient will benefit from skilled therapy to improve pelvic floor strength and endurance to hold a bowel movement to get to a commode.    Examination-Activity Limitations Toileting    Examination-Participation Restrictions Community Activity    Stability/Clinical Decision Making Stable/Uncomplicated    Rehab Potential Excellent    PT Frequency 1x / week    PT Duration --   10  weeks   PT Treatment/Interventions Biofeedback;Therapeutic exercise;Neuromuscular re-education;Therapeutic activities;Patient/family education    PT Next Visit Plan work with pelvic floor EMG in standing and with core exercises; pelvic floor contraction with walking and sit to stand    PT Home Exercise Plan Access Code: [email or web address]    Recommended Other Services MD signed intial note    Consulted and Agree with Plan of Care Patient           Patient will benefit from skilled therapeutic intervention in order to improve the following deficits and impairments:  Decreased coordination, Decreased activity tolerance, Decreased endurance, Decreased strength  Visit Diagnosis: Muscle weakness (generalized)  Other lack of coordination  Incontinence of feces, unspecified fecal incontinence type     Problem List Patient Active Problem List   Diagnosis Date Noted  . Peripheral neuropathy 04/01/2020  . Genetic testing 12/20/2018  . Family history of breast cancer   . Family history of melanoma   . Paresthesia of left leg 02/13/2015  . Osteopenia 10/06/2013  . Visit for preventive health examination 10/06/2013  . Fatigue 03/02/2011  . Family history of pancreatic cancer 03/02/2011  . OTHER DYSPHAGIA 12/31/2009  . OTHER THALASSEMIA 09/23/2007    Earlie Counts, PT 04/17/20 8:44 AM   [facility] [address] [place], [place], [address] Phone: [phone]   Fax:  [phone]  Name: Heather Goodwin MRN: [id number] Date of Birth: August 03, 1950

## 2020-04-25 DIAGNOSIS — R69 Illness, unspecified: Secondary | ICD-10-CM | POA: Diagnosis not present

## 2020-05-09 DIAGNOSIS — R69 Illness, unspecified: Secondary | ICD-10-CM | POA: Diagnosis not present

## 2020-05-20 ENCOUNTER — Encounter: Payer: Self-pay | Admitting: Physical Therapy

## 2020-05-20 ENCOUNTER — Other Ambulatory Visit: Payer: Self-pay

## 2020-05-20 ENCOUNTER — Ambulatory Visit: Payer: Medicare HMO | Attending: Gastroenterology | Admitting: Physical Therapy

## 2020-05-20 DIAGNOSIS — M6281 Muscle weakness (generalized): Secondary | ICD-10-CM

## 2020-05-20 DIAGNOSIS — R159 Full incontinence of feces: Secondary | ICD-10-CM | POA: Diagnosis not present

## 2020-05-20 DIAGNOSIS — R278 Other lack of coordination: Secondary | ICD-10-CM

## 2020-05-20 NOTE — Therapy (Signed)
[facility] [address], [place], [place], [address] Phone: [phone]   Fax:  [phone]  Physical Therapy Treatment  Patient Details  Name: Heather Goodwin MRN: [id number] Date of Birth: [birth year] Referring Provider (PT): Dr. Harl Bowie   Encounter Date: 05/20/2020   PT End of Session - 05/20/20 0807    Visit Number 3    Date for PT Re-Evaluation 06/12/20    Authorization Type Aetna Medicare    PT Start Time 0800    PT Stop Time 0840    PT Time Calculation (min) 40 min    Activity Tolerance Patient tolerated treatment well    Behavior During Therapy [facility] for tasks assessed/performed           Past Medical History:  Diagnosis Date  . Beta thalassemia trait   . Foot fracture    at gym  . Peripheral neuropathy 04/01/2020    Past Surgical History:  Procedure Laterality Date  . left knee surgery  2009   meniscus   . US ECHOCARDIOGRAPHY  2005   normal for syncope     There were no vitals filed for this visit.   Subjective Assessment - 05/20/20 0803    Subjective I am not sure about the strengthening. I had an accident that was embarrasing.    Patient Stated Goals being educated to exercise to hold the urge back when have a bowel movement, be able to hold the bowel movement until she has a bathroom she feels comfortable to use    Currently in Pain? No/denies                          Pelvic Floor Special Questions - 05/20/20 0001    Pelvic Floor Internal Exam Patient confirms identification and agrees assessment and treatment    Exam Type Rectal    Strength fair squeeze, definite lift             [facility] Adult PT Treatment/Exercise - 05/20/20 0001      Neuro Re-ed    Neuro Re-ed Details  with therapist finger in the anus giving tactile cues to the puborectalis to contract forward, movement of the coccyx anterior and down, contracting the lower abdomen and not holding for  10 sec      Knee/Hip Exercises: Seated   Sit to Sand 1 set;5 reps;without UE support   with pelvic floor contraction; control      Manual Therapy   Manual Therapy Internal Pelvic Floor    Internal Pelvic Floor manual work to the perineal body, posterior pelvic floor by the coccyx, left levator ani                  PT Education - 05/20/20 0844    Education Details sit to stand with pelvic floor contraction; stanind and hold contraction for 10 seconds    Person(s) Educated Patient    Methods Explanation;Demonstration    Comprehension Verbalized understanding;Returned demonstration            PT Short Term Goals - 05/20/20 0851      PT SHORT TERM GOAL #1   Title independent with initial HEP    Time 4    Period Weeks    Status Achieved    Target Date 05/01/20             PT Long Term Goals - 05/20/20 0852      PT LONG  TERM GOAL #1   Title Independent with HEP and understands how to progress herself    Time 10    Period Weeks    Status On-going      PT LONG TERM GOAL #2   Title able to contract her pelvic floor for 20 seconds above 25 uv due to increased pelvic floor strength and endurance    Baseline 10 seconds    Time 10    Period Weeks    Status On-going      PT LONG TERM GOAL #3   Title when she has the urge to have a bowel movement, able to hold for 30-45 minutes to get to her commode    Baseline had an accident since last visit    Time 10    Period Weeks    Status On-going                 Plan - 05/20/20 0807    Clinical Impression Statement Patient has had one accident since last visit. Her pelvic floor strength is 3/5 holding for 10 seconds with verbal cues to breath, contract the lower abdominals, and move the coccyx forward. Patient is able to engage the lower abdominals well. Patient is holding her breath with contraction and needs verbal cues. Patient needs verbal cues to go from sit to stand with control and pelvic floor contraction.  Patient is progressing her strength. Patient will benefit from skilled therapy to improve pelvic floor coordination and strength to reduce fecal leakage.    Examination-Activity Limitations Toileting    Examination-Participation Restrictions Community Activity    Stability/Clinical Decision Making Stable/Uncomplicated    Rehab Potential Excellent    PT Frequency 1x / week    PT Duration --   10 weeks   PT Treatment/Interventions Biofeedback;Therapeutic exercise;Neuromuscular re-education;Therapeutic activities;Patient/family education    PT Next Visit Plan work with pelvic floor EMG in standing and with core exercises; pelvic floor contraction with walking and sit to stand; maybe do internal again to see about the quality of the contraction; possible renewa to 1 time per month    PT Home Exercise Plan Access Code: [email or web address]    Consulted and Agree with Plan of Care Patient           Patient will benefit from skilled therapeutic intervention in order to improve the following deficits and impairments:  Decreased coordination, Decreased activity tolerance, Decreased endurance, Decreased strength  Visit Diagnosis: Muscle weakness (generalized)  Other lack of coordination  Incontinence of feces, unspecified fecal incontinence type     Problem List Patient Active Problem List   Diagnosis Date Noted  . Peripheral neuropathy 04/01/2020  . Genetic testing 12/20/2018  . Family history of breast cancer   . Family history of melanoma   . Paresthesia of left leg 02/13/2015  . Osteopenia 10/06/2013  . Visit for preventive health examination 10/06/2013  . Fatigue 03/02/2011  . Family history of pancreatic cancer 03/02/2011  . OTHER DYSPHAGIA 12/31/2009  . OTHER THALASSEMIA 09/23/2007    Earlie Counts, PT 05/20/20 8:54 AM   [facility] [address], [place], [place], [address] Phone: [phone]   Fax:  [phone]  Name:  AILEANA HODDER MRN: [id number] Date of Birth: 1951-06-16

## 2020-05-22 DIAGNOSIS — R69 Illness, unspecified: Secondary | ICD-10-CM | POA: Diagnosis not present

## 2020-05-27 ENCOUNTER — Ambulatory Visit: Payer: Medicare HMO | Admitting: Internal Medicine

## 2020-05-29 ENCOUNTER — Encounter: Payer: Self-pay | Admitting: Physical Therapy

## 2020-05-29 ENCOUNTER — Ambulatory Visit: Payer: Medicare HMO | Attending: Gastroenterology | Admitting: Physical Therapy

## 2020-05-29 ENCOUNTER — Other Ambulatory Visit: Payer: Self-pay

## 2020-05-29 DIAGNOSIS — R278 Other lack of coordination: Secondary | ICD-10-CM | POA: Insufficient documentation

## 2020-05-29 DIAGNOSIS — M6281 Muscle weakness (generalized): Secondary | ICD-10-CM | POA: Diagnosis not present

## 2020-05-29 DIAGNOSIS — R159 Full incontinence of feces: Secondary | ICD-10-CM | POA: Diagnosis not present

## 2020-05-29 NOTE — Therapy (Signed)
[facility] [address] [place], [place], [address] Phone: [phone]   Fax:  [phone]  Physical Therapy Treatment  Patient Details  Name: Heather Goodwin MRN: [id number] Date of Birth: Mar 28, 1951 Referring Provider (PT): Dr. Harl Bowie   Encounter Date: 05/29/2020   PT End of Session - 05/29/20 0935    Visit Number 4    Date for PT Re-Evaluation 06/12/20    Authorization Type [employer] Medicare    PT Start Time 0800    PT Stop Time 0840    PT Time Calculation (min) 40 min    Activity Tolerance Patient tolerated treatment well    Behavior During Therapy [facility] for tasks assessed/performed           Past Medical History:  Diagnosis Date  . Beta thalassemia trait   . Foot fracture    at gym  . Peripheral neuropathy 04/01/2020    Past Surgical History:  Procedure Laterality Date  . left knee surgery  2009   meniscus   . US ECHOCARDIOGRAPHY  2005   normal for syncope     There were no vitals filed for this visit.   Subjective Assessment - 05/29/20 0803    Subjective I am doing my exercises 1 time per day. I wipe more than 1 time.    Patient Stated Goals being educated to exercise to hold the urge back when have a bowel movement, be able to hold the bowel movement until she has a bathroom she feels comfortable to use    Currently in Pain? No/denies    Multiple Pain Sites No              [facility] PT Assessment - 05/29/20 0001      Assessment   Medical Diagnosis M62.89 Pelvic floor dysfunction    Referring Provider (PT) Dr. Harl Bowie    Onset Date/Surgical Date --   chronic   Prior Therapy yes      Precautions   Precautions None      Restrictions   Weight Bearing Restrictions No      Home Environment   Living Environment Private residence      Prior Function   Level of Independence Independent    Leisure hiking, tennis, walk 20 miles      Cognition   Overall Cognitive Status  Within Functional Limits for tasks assessed      Posture/Postural Control   Posture/Postural Control No significant limitations      AROM   Overall AROM Comments lumbar ROM is full      Strength   Right Hip Flexion 5/5    Left Hip Flexion 5/5                      Pelvic Floor Special Questions - 05/29/20 0001    Strength fair squeeze, definite lift             [facility] Adult PT Treatment/Exercise - 05/29/20 0001      Self-Care   Self-Care Other Self-Care Comments    Other Self-Care Comments  educated patient on Scientist, product/process development that will work with the pelvic floor, educated patient on a trainer at her exercise area that will focus on the pelvic floor      Neuro Re-ed    Neuro Re-ed Details  educated patient on urge to void when she has the urge to have a bowel movement and not in the place to do it  Knee/Hip Exercises: Seated   Other Seated Knee/Hip Exercises sitting pelvic floor exercises with therapist touching the coccyx to see it move with contraction and engaging the abdominals, then did the same in sidely                   PT Education - 05/29/20 0841    Education Details Access Code: [email or web address]; urge to void; referred to other exercise specialist that work with pilates and exercise classes that think of the pelvic floor    Person(s) Educated Patient    Methods Explanation;Demonstration;Verbal cues;Handout    Comprehension Verbalized understanding;Returned demonstration            PT Short Term Goals - 05/20/20 0851      PT SHORT TERM GOAL #1   Title independent with initial HEP    Time 4    Period Weeks    Status Achieved    Target Date 05/01/20             PT Long Term Goals - 05/29/20 0810      PT LONG TERM GOAL #1   Title Independent with HEP and understands how to progress herself    Time 10    Period Weeks    Status Achieved      PT LONG TERM GOAL #2   Title able to contract her pelvic floor for 20 seconds above 25  uv due to increased pelvic floor strength and endurance    Baseline 10 seconds    Period Weeks      PT LONG TERM GOAL #3   Title when she has the urge to have a bowel movement, able to hold for 30-45 minutes to get to her commode    Time 10    Period Weeks    Status Partially Met                 Plan - 05/29/20 0935    Clinical Impression Statement Pelvic floor strength is 3/5. Patient has increased strength in hips. Patient is able contract her pelvic floor for 20 seconds. Patient had one time of urge to void and had fecal leakage. Patient does wipe more than one time after a bowel movement. She was instructed to contract the anus after bowel movement to reduce the wiping. Patient is having 2-3 bowel movements per day with type 4. Patient is able to contract her lower abdominals correctly. Patient was instructed of other ways to exercise with instructors that work with the pelvic floor. Patient will benefit from skilled therapy to improve pelvic floor coordination and strength to reduce fecal leakage.    Examination-Activity Limitations Toileting    Examination-Participation Restrictions Community Activity    Stability/Clinical Decision Making Stable/Uncomplicated    Rehab Potential Excellent    PT Treatment/Interventions Biofeedback;Therapeutic exercise;Neuromuscular re-education;Therapeutic activities;Patient/family education    PT Next Visit Plan Discharge to HEP    PT Home Exercise Plan Access Code: [email or web address]    Consulted and Agree with Plan of Care Patient           Patient will benefit from skilled therapeutic intervention in order to improve the following deficits and impairments:  Decreased coordination, Decreased activity tolerance, Decreased endurance, Decreased strength  Visit Diagnosis: Muscle weakness (generalized)  Other lack of coordination  Incontinence of feces, unspecified fecal incontinence type     Problem List Patient Active Problem List    Diagnosis Date Noted  . Peripheral neuropathy 04/01/2020  . Genetic testing 12/20/2018  . Family  history of breast cancer   . Family history of melanoma   . Paresthesia of left leg 02/13/2015  . Osteopenia 10/06/2013  . Visit for preventive health examination 10/06/2013  . Fatigue 03/02/2011  . Family history of pancreatic cancer 03/02/2011  . OTHER DYSPHAGIA 12/31/2009  . OTHER THALASSEMIA 09/23/2007    Earlie Counts, PT 05/29/20 9:41 AM    [facility] [address], [place], [place], [address] Phone: [phone]   Fax:  [phone]  Name: Heather Goodwin MRN: [id number] Date of Birth: 06/22/1951  PHYSICAL THERAPY DISCHARGE SUMMARY  Visits from Start of Care: 4  Current functional level related to goals / functional outcomes: See above.    Remaining deficits: See above.   Education / Equipment: HEP Plan: Patient agrees to discharge.  Patient goals were partially met. Patient is being discharged due to                                                    maximizing her therapy. Thank you for the referral. Earlie Counts, PT 05/29/20 9:42 AM   ?????

## 2020-05-29 NOTE — Patient Instructions (Signed)
Access Code: [email or web address] URL: [email or web address] Date: 05/29/2020 Prepared by: Earlie Counts  Exercises Standing Pelvic Floor Contraction - 2 x daily - 7 x weekly - 1 sets - 10 reps - 5 sec hold Seated Pelvic Floor Contraction - 2 x daily - 7 x weekly - 1 sets - 10 reps - 30 hold [facility] [address], [place], [place] [address] Phone # [phone] Fax [phone]

## 2020-06-13 ENCOUNTER — Other Ambulatory Visit: Payer: Self-pay | Admitting: Gastroenterology

## 2020-06-25 DIAGNOSIS — R69 Illness, unspecified: Secondary | ICD-10-CM | POA: Diagnosis not present

## 2020-07-02 NOTE — Progress Notes (Signed)
Chief Complaint  Patient presents with  . Headache  . Tinnitus    Went to ENT   . Fatigue    Going on for months    HPI: Heather Goodwin 70 y.o. come in for on going and poss worsening  Sx  Low energy sleeping in and still tired  Physically active  And healthy otherwise  Today is ok but has been having   Post  Occipital HA    Neck to top of head .  Comes and goes     Last  Hours  And then fades   No vision changes with this but feels brain is  Not focused    Humming is  Bothersome  .  Whole head feels vibrating    High pitched .  No problem falling asleep.   Still hiking but  Not as easy.  But no chest pain shortness of breath.  Wake up with headaches many days and subside .   Changed  Interfering.  Advil?   Couple  Hours.  No motor changes still has the tingling numbness previously described and evaluated as a peripheral neuropathy.   ROS: See pertinent positives and negatives per HPI.  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  Past Medical History:  Diagnosis Date  . Beta thalassemia trait   . Foot fracture    at gym  . Peripheral neuropathy 04/01/2020    Family History  Problem Relation Age of Onset  . Thyroid disease Mother        partial thyroidectomy  . Pancreatic cancer Mother 41  . Cirrhosis Father        secondary to chronic hepatitis b infection  . Other Son        B Thal trait  . Breast cancer Paternal Aunt 74  . Breast cancer Maternal Aunt 80  . Pancreatic cancer Maternal Grandfather        dx 75-80  . Melanoma Brother 77  . Colon cancer Neg Hx   . Esophageal cancer Neg Hx   . Rectal cancer Neg Hx     Social History   Socioeconomic History  . Marital status: Unknown    Spouse name: Not on file  . Number of children: 3  . Years of education: Not on file  . Highest education level: Not on file  Occupational History  . Occupation: Real Estate  Tobacco Use  . Smoking status: Never Smoker  . Smokeless tobacco:  Never Used  Vaping Use  . Vaping Use: Never used  Substance and Sexual Activity  . Alcohol use: Yes    Alcohol/week: 1.0 - 4.0 standard drink    Types: 1 - 4 Glasses of wine per week    Comment: occ  . Drug use: No  . Sexual activity: Not on file  Other Topics Concern  . Not on file  Social History Narrative   Married   Regular exercise- yes   Real estate-Management rentalproperties   3 children   2-3 caffeine drinks daily   Patient is right handed.         Social Determinants of Health   Financial Resource Strain: Not on file  Food Insecurity: Not on file  Transportation Needs: Not on file  Physical Activity: Not on file  Stress: Not on file  Social Connections: Not on file    Outpatient Medications Prior to Visit  Medication Sig Dispense Refill  . colestipol (COLESTID) 1 g tablet TAKE 1 TABLET BY  MOUTH TWICE A DAY 180 tablet 1  . hydroquinone 4 % cream APPLY A SMALL AMOUNT TO SKIN ONCE A DAY    . Wheat Dextrin (BENEFIBER DRINK MIX PO) Take by mouth in the morning and at bedtime. 3 teaspoons two times  daily     No facility-administered medications prior to visit.     EXAM:  BP 102/78 (BP Location: Right Arm, Patient Position: Sitting, Cuff Size: Large)   Pulse 88   Temp 98.1 F (36.7 C) (Oral)   Ht 5\' 6"  (1.676 m)   Wt 148 lb (67.1 kg)   SpO2 97%   BMI 23.89 kg/m   Body mass index is 23.89 kg/m.  GENERAL: vitals reviewed and listed above, alert, oriented, appears well hydrated and in no acute distress HEENT: atraumatic, conjunctiva  clear, no obvious abnormalities on inspection of external nose and ears OP : Masked NECK: no obvious masses on inspection palpation supple no point tenderness LUNGS: clear to auscultation bilaterally, no wheezes, rales or rhonchi, good air movement CV: HRRR, no clubbing cyanosis or  peripheral edema nl cap refill  MS: moves all extremities without noticeable focal  Abnormality No gross abnormalities looks well PSYCH:  pleasant and cooperative, no obvious depression or anxiety Lab Results  Component Value Date   WBC 5.9 10/03/2019   HGB 12.4 10/03/2019   HCT 38.7 10/03/2019   PLT 222.0 10/03/2019   GLUCOSE 98 10/03/2019   CHOL 222 (H) 10/03/2019   TRIG 96.0 10/03/2019   HDL 70.50 10/03/2019   LDLDIRECT 123.1 03/02/2011   LDLCALC 132 (H) 10/03/2019   ALT 13 10/03/2019   AST 14 10/03/2019   NA 137 10/03/2019   K 4.0 10/03/2019   CL 101 10/03/2019   CREATININE 0.73 10/03/2019   BUN 13 10/03/2019   CO2 30 10/03/2019   TSH 1.85 10/03/2019   BP Readings from Last 3 Encounters:  07/03/20 102/78  03/05/20 105/68  01/31/20 100/60  IMPRESSION:  Nerve conduction studies done on both lower extremities shows 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  EMG evaluation of the left lower extremity was relatively unremarkable, without evidence of an overlying lumbosacral radiculopathy.  Heather Alexanders MD 04/01/2020 1:39 PM  [facility] [address] [place], [place] [address]  ASSESSMENT AND PLAN:  Discussed the following assessment and plan:  New onset of headaches after age 8  Tinnitus aurium, bilateral - very interfering for patient had audiology eval.  Personal history of COVID-19  Idiopathic progressive neuropathy  Low energy Headaches have been going on for a number of months but a bit more frequent not every day but a.m. headaches question cervicogenic versus other fortunately no focal symptoms but seems to have appeared at the same time as the tinnitus and progression of the neuropathy.  Uncertain if related to COVID or the vaccine or other. Fatigue may be related to all above and perhaps stress Plan to headache calendar consider scanning because of age and associated symptoms Will send note and information to Dr. Jannifer Franklin and ask him to see patient  again for headache evaluation with other symptoms. ? If night valium or  flexeril will help without  Causing lingering se ?  No hx or risks  of osa  -Patient advised to return or notify health care team  if  new concerns arise.  Patient Instructions  Uncertain cause.   Headaches new onset  Am. .   With fatigue increase sleep needs and tinnitus  more interfering .  HA calendar discussed  Will get message to dr Jannifer Franklin for reevaluation  Opinion consider head scanning     Heather Goodwin. Heather Goodwin M.D.

## 2020-07-03 ENCOUNTER — Ambulatory Visit (INDEPENDENT_AMBULATORY_CARE_PROVIDER_SITE_OTHER): Payer: Medicare HMO | Admitting: Internal Medicine

## 2020-07-03 ENCOUNTER — Other Ambulatory Visit: Payer: Self-pay

## 2020-07-03 ENCOUNTER — Encounter: Payer: Self-pay | Admitting: Internal Medicine

## 2020-07-03 VITALS — BP 102/78 | HR 88 | Temp 98.1°F | Ht 66.0 in | Wt 148.0 lb

## 2020-07-03 DIAGNOSIS — Z8616 Personal history of COVID-19: Secondary | ICD-10-CM | POA: Diagnosis not present

## 2020-07-03 DIAGNOSIS — G603 Idiopathic progressive neuropathy: Secondary | ICD-10-CM | POA: Diagnosis not present

## 2020-07-03 DIAGNOSIS — R5383 Other fatigue: Secondary | ICD-10-CM

## 2020-07-03 DIAGNOSIS — H9313 Tinnitus, bilateral: Secondary | ICD-10-CM

## 2020-07-03 DIAGNOSIS — R519 Headache, unspecified: Secondary | ICD-10-CM | POA: Diagnosis not present

## 2020-07-03 NOTE — Patient Instructions (Addendum)
Uncertain cause.   Headaches new onset  Am. .   With fatigue increase sleep needs and tinnitus more interfering .  HA calendar discussed  Will get message to dr Jannifer Franklin for reevaluation  Opinion consider head scanning

## 2020-07-11 IMAGING — MG DIGITAL SCREENING BILAT W/ TOMO W/ CAD
8 series · 9 of 24 positions shown · non-contrast
Comparison: Previous exam(s).

CLINICAL DATA: Screening.

EXAM:
DIGITAL SCREENING BILATERAL MAMMOGRAM WITH TOMO AND CAD

[L CC synth-2D]
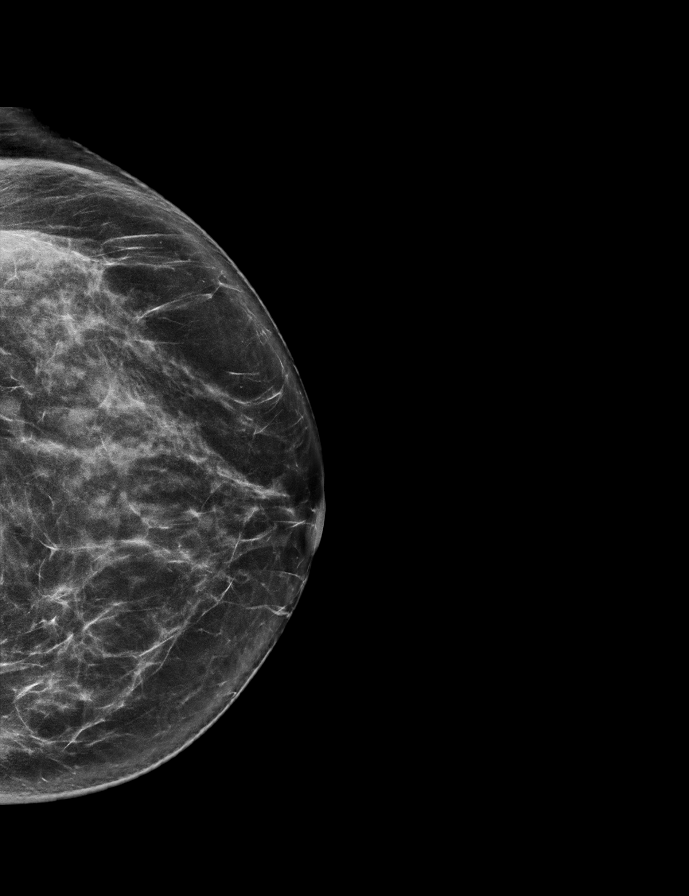

[R CC synth-2D]
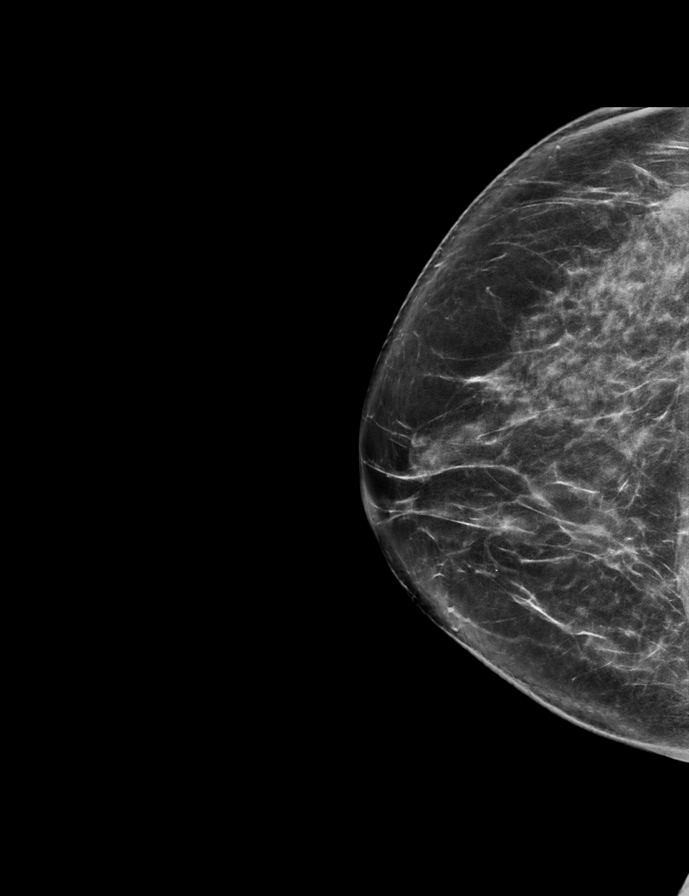

[L MLO synth-2D]
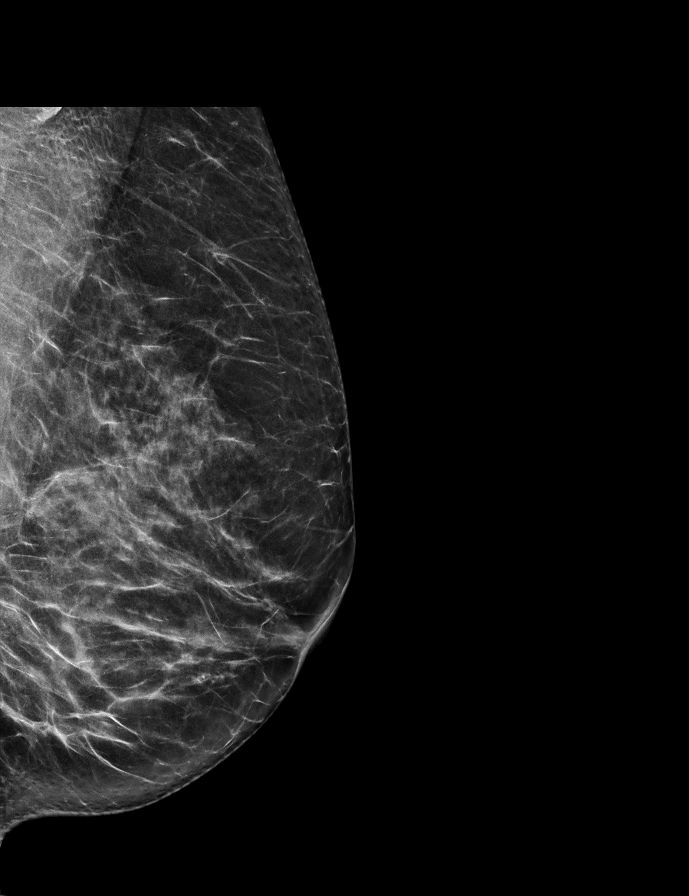

[R MLO synth-2D]
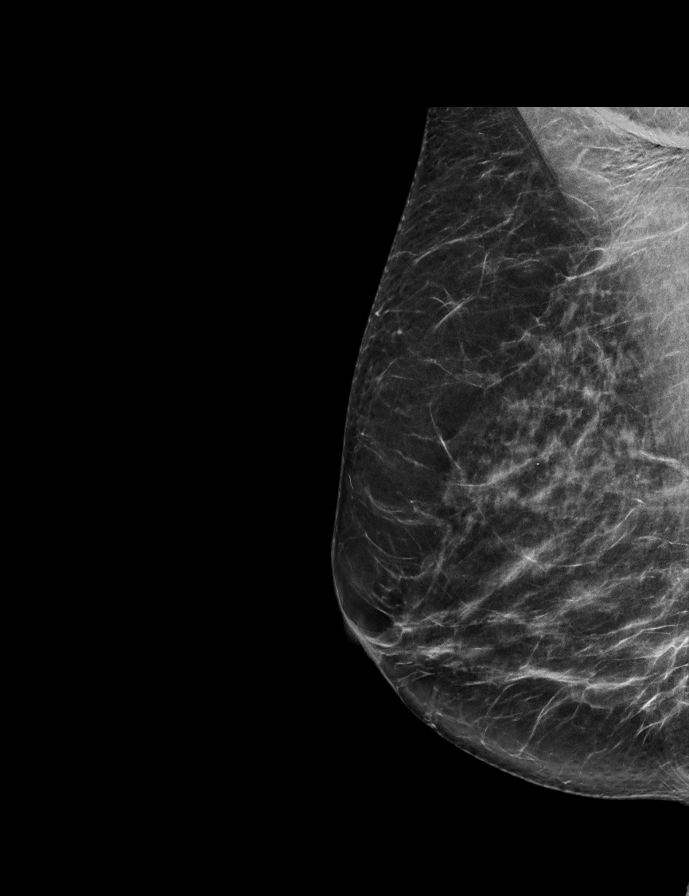

[R CC tomo · 2 of 71 frames shown]
[frame 23/71]
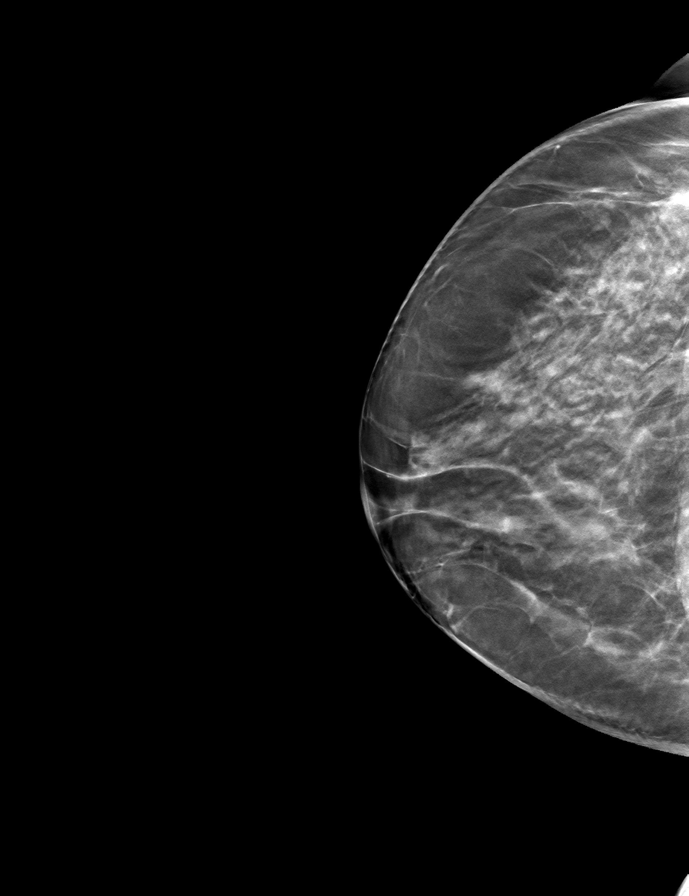
[frame 36/71]
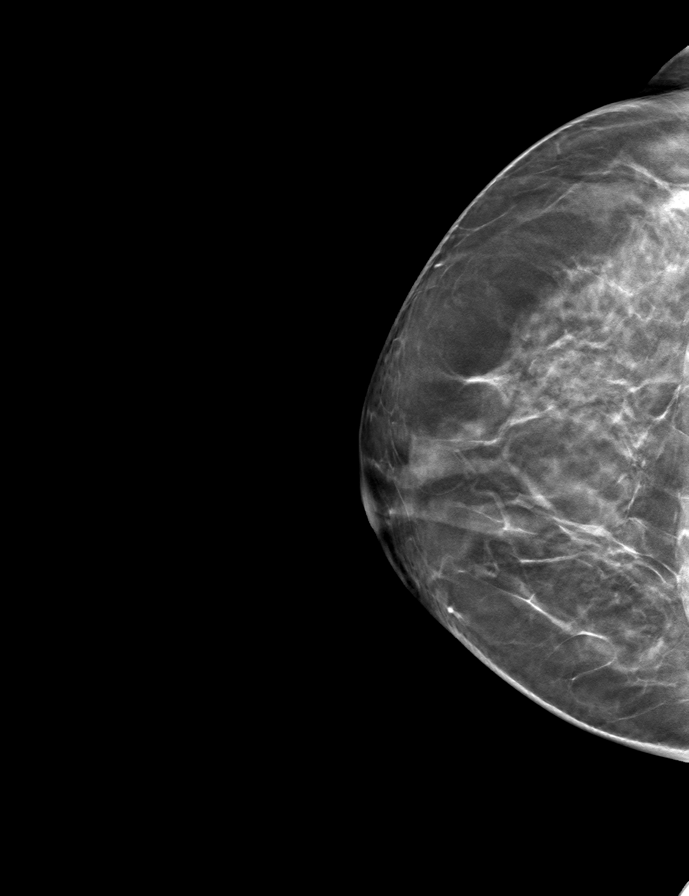

[R MLO tomo · tomo slice 32/63.0]
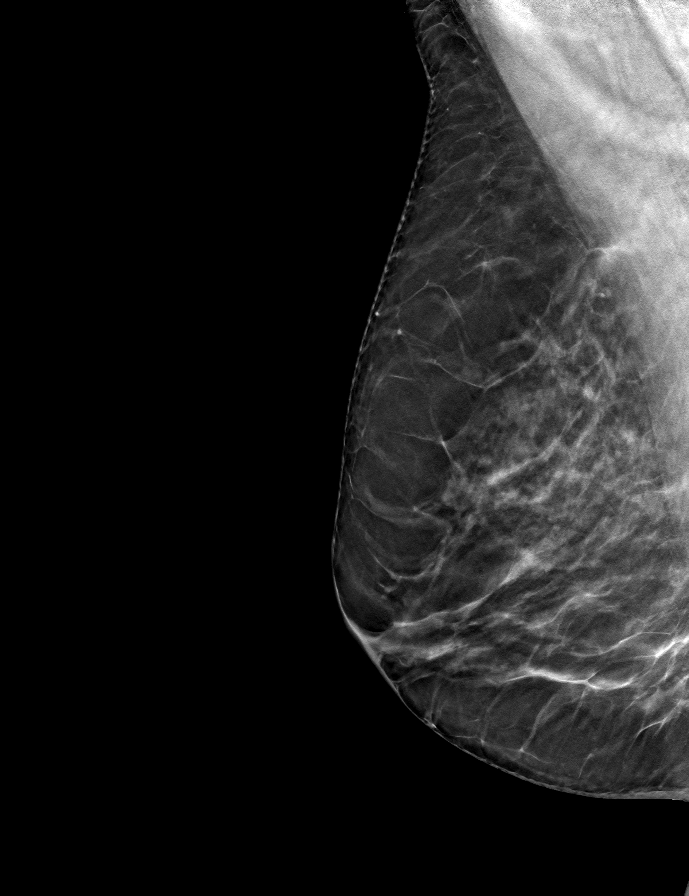

[L CC tomo · tomo slice 35/68.0]
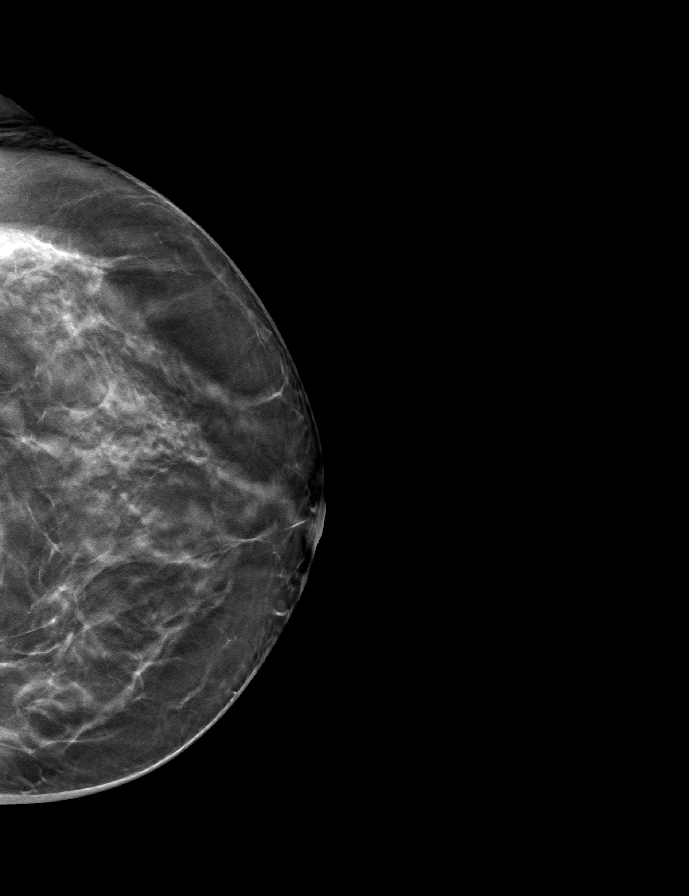

[L MLO tomo · tomo slice 32/63.0]
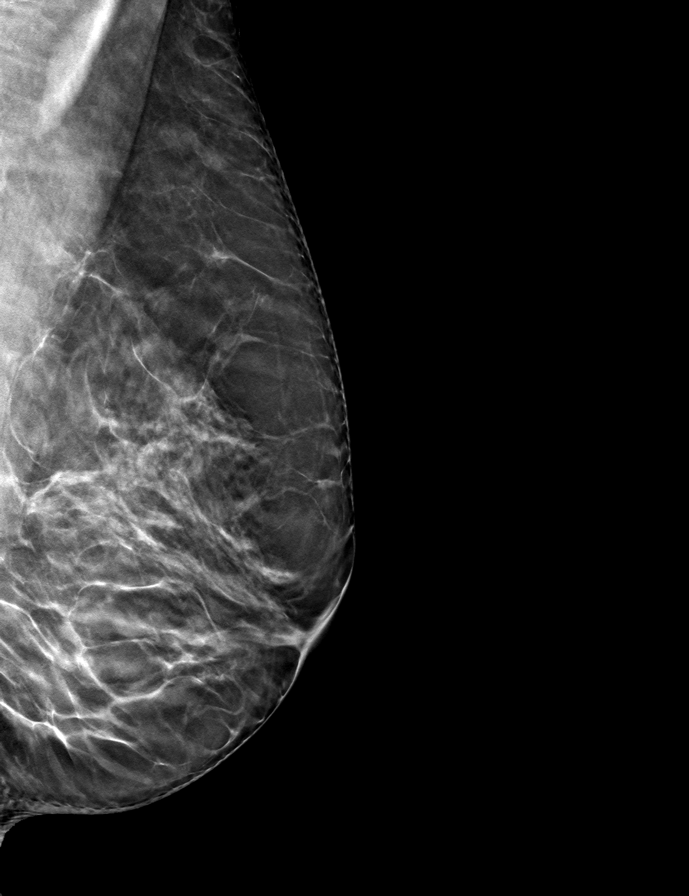

[9 of 24 positions shown; findings below may reference images not displayed]

ACR Breast Density Category c: The breast tissue is heterogeneously
dense, which may obscure small masses.
FINDINGS: There are no findings suspicious for malignancy. Images were
processed with CAD.
IMPRESSION: No mammographic evidence of malignancy. A result letter of this
screening mammogram will be mailed directly to the patient.

RECOMMENDATION:
Screening mammogram in one year. (Code:[id number]-[id number])

BI-RADS CATEGORY  1: Negative.

## 2020-07-18 DIAGNOSIS — R69 Illness, unspecified: Secondary | ICD-10-CM | POA: Diagnosis not present

## 2020-07-23 ENCOUNTER — Other Ambulatory Visit: Payer: Self-pay | Admitting: Obstetrics & Gynecology

## 2020-07-23 DIAGNOSIS — Z1231 Encounter for screening mammogram for malignant neoplasm of breast: Secondary | ICD-10-CM

## 2020-08-01 DIAGNOSIS — R69 Illness, unspecified: Secondary | ICD-10-CM | POA: Diagnosis not present

## 2020-08-06 DIAGNOSIS — R69 Illness, unspecified: Secondary | ICD-10-CM | POA: Diagnosis not present

## 2020-08-15 DIAGNOSIS — R69 Illness, unspecified: Secondary | ICD-10-CM | POA: Diagnosis not present

## 2020-08-19 ENCOUNTER — Telehealth: Payer: Self-pay | Admitting: Internal Medicine

## 2020-08-19 NOTE — Telephone Encounter (Signed)
Left message for patient to call back and schedule Medicare Annual Wellness Visit (AWV) either virtually or in office. No detailed message left   AWVI  please schedule at anytime with [facility] Nurse Health Advisor 1 or 2   This should be a 45 minute visit. 

## 2020-08-20 DIAGNOSIS — R69 Illness, unspecified: Secondary | ICD-10-CM | POA: Diagnosis not present

## 2020-08-29 DIAGNOSIS — R69 Illness, unspecified: Secondary | ICD-10-CM | POA: Diagnosis not present

## 2020-09-02 DIAGNOSIS — R69 Illness, unspecified: Secondary | ICD-10-CM | POA: Diagnosis not present

## 2020-09-06 ENCOUNTER — Ambulatory Visit: Payer: Medicare HMO

## 2020-09-09 NOTE — Progress Notes (Deleted)
Subjective:   Heather Goodwin is a 70 y.o. female who presents for an Initial Medicare Annual Wellness Visit.  I connected with *** today by telephone and verified that I am speaking with the correct person using two identifiers. Location patient: home Location provider: work Persons participating in the virtual visit: patient, provider.   I discussed the limitations, risks, security and privacy concerns of performing an evaluation and management service by telephone and the availability of in person appointments. I also discussed with the patient that there may be a patient responsible charge related to this service. The patient expressed understanding and verbally consented to this telephonic visit.    Interactive audio and video telecommunications were attempted between this provider and patient, however failed, due to patient having technical difficulties OR patient did not have access to video capability.  We continued and completed visit with audio only.      Review of Systems    N/A        Objective:    There were no vitals filed for this visit. There is no height or weight on file to calculate BMI.  Advanced Directives 04/03/2020 06/23/2018 02/13/2015  Does Patient Have a Medical Advance Directive? Unable to assess, patient is non-responsive or altered mental status Yes Yes  Type of Advance Directive - Living will [place];Living will  Does patient want to make changes to medical advance directive? - No - Patient declined -    Current Medications (verified) Outpatient Encounter Medications as of 09/10/2020  Medication Sig  . colestipol (COLESTID) 1 g tablet TAKE 1 TABLET BY MOUTH TWICE A DAY  . hydroquinone 4 % cream APPLY A SMALL AMOUNT TO SKIN ONCE A DAY  . Wheat Dextrin (BENEFIBER DRINK MIX PO) Take by mouth in the morning and at bedtime. 3 teaspoons two times  daily   No facility-administered encounter medications on file as of 09/10/2020.     Allergies (verified) No known allergies   History: Past Medical History:  Diagnosis Date  . Beta thalassemia trait   . Foot fracture    at gym  . Peripheral neuropathy 04/01/2020   Past Surgical History:  Procedure Laterality Date  . left knee surgery  2009   meniscus   . US ECHOCARDIOGRAPHY  2005   normal for syncope    Family History  Problem Relation Age of Onset  . Thyroid disease Mother        partial thyroidectomy  . Pancreatic cancer Mother 75  . Cirrhosis Father        secondary to chronic hepatitis b infection  . Other Son        B Thal trait  . Breast cancer Paternal Aunt 92  . Breast cancer Maternal Aunt 80  . Pancreatic cancer Maternal Grandfather        dx 75-80  . Melanoma Brother 8  . Colon cancer Neg Hx   . Esophageal cancer Neg Hx   . Rectal cancer Neg Hx    Social History   Socioeconomic History  . Marital status: Unknown    Spouse name: Not on file  . Number of children: 3  . Years of education: Not on file  . Highest education level: Not on file  Occupational History  . Occupation: Real Estate  Tobacco Use  . Smoking status: Never Smoker  . Smokeless tobacco: Never Used  Vaping Use  . Vaping Use: Never used  Substance and Sexual Activity  . Alcohol use:  Yes    Alcohol/week: 1.0 - 4.0 standard drink    Types: 1 - 4 Glasses of wine per week    Comment: occ  . Drug use: No  . Sexual activity: Not on file  Other Topics Concern  . Not on file  Social History Narrative   Married   Regular exercise- yes   Real estate-Management rentalproperties   3 children   2-3 caffeine drinks daily   Patient is right handed.         Social Determinants of Health   Financial Resource Strain: Not on file  Food Insecurity: Not on file  Transportation Needs: Not on file  Physical Activity: Not on file  Stress: Not on file  Social Connections: Not on file    Tobacco Counseling Counseling given: Not Answered   Clinical Intake:                  Diabetic?No          Activities of Daily Living In your present state of health, do you have any difficulty performing the following activities: 07/03/2020  Hearing? N  Vision? N  Difficulty concentrating or making decisions? N  Walking or climbing stairs? N  Dressing or bathing? N  Doing errands, shopping? N  Some recent data might be hidden    Patient Care Team: Panosh, Standley Brooking, MD as PCP - General Azucena Fallen, MD (Obstetrics and Gynecology) Wylene Simmer, MD as Consulting Physician (Orthopedic Surgery)  Indicate any recent Medical Services you may have received from other than [facility] providers in the past year (date may be approximate).     Assessment:   This is a routine wellness examination for Heather Goodwin.  Hearing/Vision screen No exam data present  Dietary issues and exercise activities discussed:    Goals   None    Depression Screen PHQ 2/9 Scores 10/03/2019  PHQ - 2 Score 0    Fall Risk Fall Risk  10/03/2019  Falls in the past year? 0  Number falls in past yr: 0  Injury with Fall? 0  Follow up Falls evaluation completed    [place]:  Any stairs in or around the home? {YES/NO:21197} If so, are there any without handrails? No  Home free of loose throw rugs in walkways, pet beds, electrical cords, etc? Yes  Adequate lighting in your home to reduce risk of falls? Yes   ASSISTIVE DEVICES UTILIZED TO PREVENT FALLS:  Life alert? {YES/NO:21197} Use of a cane, walker or w/c? {YES/NO:21197} Grab bars in the bathroom? {YES/NO:21197} Shower chair or bench in shower? {YES/NO:21197} Elevated toilet seat or a handicapped toilet? {YES/NO:21197}   Cognitive Function:        Immunizations Immunization History  Administered Date(s) Administered  . PFIZER(Purple Top)SARS-COV-2 Vaccination 08/26/2019, 09/26/2019  . Td 06/23/2003  . Tdap 10/06/2013  . Zoster 10/06/2013  . Zoster Recombinat  (Shingrix) 02/09/2018    TDAP status: Up to date  {Flu Vaccine status:2101806}  Pneumococcal vaccine status: Due, Education has been provided regarding the importance of this vaccine. Advised may receive this vaccine at local pharmacy or Health Dept. Aware to provide a copy of the vaccination record if obtained from local pharmacy or Health Dept. Verbalized acceptance and understanding.  Covid-19 vaccine status: Completed vaccines  Qualifies for Shingles Vaccine? Yes   Zostavax completed No   Shingrix Completed?: Yes  Screening Tests Health Maintenance  Topic Date Due  . Hepatitis C Screening  Never done  .  PNA vac Low Risk Adult (1 of 2 - PCV13) Never done  . INFLUENZA VACCINE  Never done  . COVID-19 Vaccine (3 - Booster for Pfizer series) 03/27/2020  . COLONOSCOPY (Pts 45-79yrs Insurance coverage will need to be confirmed)  03/09/2021  . MAMMOGRAM  07/02/2021  . TETANUS/TDAP  10/07/2023  . DEXA SCAN  Completed  . HPV VACCINES  Aged Out    Health Maintenance  Health Maintenance Due  Topic Date Due  . Hepatitis C Screening  Never done  . PNA vac Low Risk Adult (1 of 2 - PCV13) Never done  . INFLUENZA VACCINE  Never done  . COVID-19 Vaccine (3 - Booster for Pfizer series) 03/27/2020    {Colorectal cancer screening:2101809}  Mammogram status: Completed 07/23/2020. Repeat every year  Bone Density status: Completed 01/18/2019. Results reflect: Bone density results: OSTEOPENIA. Repeat every 5 years.  Lung Cancer Screening: (Low Dose CT Chest recommended if Age 50-80 years, 30 pack-year currently smoking OR have quit w/in 15years.) does not qualify.   Lung Cancer Screening Referral: N/A   Additional Screening:  Hepatitis C Screening: does qualify; Completed ***  Vision Screening: Recommended annual ophthalmology exams for early detection of glaucoma and other disorders of the eye. Is the patient up to date with their annual eye exam?  {YES/NO:21197} Who is the  provider or what is the name of the office in which the patient attends annual eye exams? *** If pt is not established with a provider, would they like to be referred to a provider to establish care? {YES/NO:21197}.   Dental Screening: Recommended annual dental exams for proper oral hygiene  Community Resource Referral / Chronic Care Management: CRR required this visit?  No   CCM required this visit?  No      Plan:     I have personally reviewed and noted the following in the patient's chart:   . Medical and social history . Use of alcohol, tobacco or illicit drugs  . Current medications and supplements . Functional ability and status . Nutritional status . Physical activity . Advanced directives . List of other physicians . Hospitalizations, surgeries, and ER visits in previous 12 months . Vitals . Screenings to include cognitive, depression, and falls . Referrals and appointments  In addition, I have reviewed and discussed with patient certain preventive protocols, quality metrics, and best practice recommendations. A written personalized care plan for preventive services as well as general preventive health recommendations were provided to patient.     Ofilia Neas, LPN   [id number]   Nurse Notes: None

## 2020-09-10 ENCOUNTER — Telehealth: Payer: Self-pay | Admitting: Internal Medicine

## 2020-09-10 ENCOUNTER — Ambulatory Visit: Payer: Medicare HMO

## 2020-09-10 DIAGNOSIS — Z0000 Encounter for general adult medical examination without abnormal findings: Secondary | ICD-10-CM

## 2020-09-10 NOTE — Progress Notes (Signed)
Erroneous Encounter

## 2020-09-10 NOTE — Telephone Encounter (Signed)
Contacted patient at the time of her medicare wellness visit. Patient became defensive and stated that she did not see reason for Medicare wellness visit and she stated that she did not want to complete visit. Visit cancelled.

## 2020-09-11 ENCOUNTER — Other Ambulatory Visit: Payer: Self-pay

## 2020-09-11 ENCOUNTER — Ambulatory Visit
Admission: RE | Admit: 2020-09-11 | Discharge: 2020-09-11 | Disposition: A | Discharge status: Home / Self Care | Payer: Medicare HMO | Source: Ambulatory Visit | Attending: Obstetrics & Gynecology | Admitting: Obstetrics & Gynecology

## 2020-09-11 DIAGNOSIS — Z1231 Encounter for screening mammogram for malignant neoplasm of breast: Secondary | ICD-10-CM

## 2020-09-11 DIAGNOSIS — R69 Illness, unspecified: Secondary | ICD-10-CM | POA: Diagnosis not present

## 2020-09-11 DIAGNOSIS — Z0000 Encounter for general adult medical examination without abnormal findings: Secondary | ICD-10-CM | POA: Diagnosis not present

## 2020-09-11 DIAGNOSIS — Z124 Encounter for screening for malignant neoplasm of cervix: Secondary | ICD-10-CM | POA: Diagnosis not present

## 2020-09-11 DIAGNOSIS — Z6823 Body mass index (BMI) 23.0-23.9, adult: Secondary | ICD-10-CM | POA: Diagnosis not present

## 2020-09-11 DIAGNOSIS — Z1151 Encounter for screening for human papillomavirus (HPV): Secondary | ICD-10-CM | POA: Diagnosis not present

## 2020-09-11 DIAGNOSIS — R5383 Other fatigue: Secondary | ICD-10-CM | POA: Diagnosis not present

## 2020-09-11 DIAGNOSIS — M8580 Other specified disorders of bone density and structure, unspecified site: Secondary | ICD-10-CM | POA: Diagnosis not present

## 2020-09-11 DIAGNOSIS — Z01419 Encounter for gynecological examination (general) (routine) without abnormal findings: Secondary | ICD-10-CM | POA: Diagnosis not present

## 2020-09-11 DIAGNOSIS — E559 Vitamin D deficiency, unspecified: Secondary | ICD-10-CM | POA: Diagnosis not present

## 2020-09-11 DIAGNOSIS — Z01411 Encounter for gynecological examination (general) (routine) with abnormal findings: Secondary | ICD-10-CM | POA: Diagnosis not present

## 2020-09-17 ENCOUNTER — Other Ambulatory Visit: Payer: Self-pay | Admitting: Obstetrics & Gynecology

## 2020-09-17 DIAGNOSIS — M8580 Other specified disorders of bone density and structure, unspecified site: Secondary | ICD-10-CM

## 2020-09-17 DIAGNOSIS — R69 Illness, unspecified: Secondary | ICD-10-CM | POA: Diagnosis not present

## 2020-09-18 DIAGNOSIS — R69 Illness, unspecified: Secondary | ICD-10-CM | POA: Diagnosis not present

## 2020-09-25 DIAGNOSIS — R69 Illness, unspecified: Secondary | ICD-10-CM | POA: Diagnosis not present

## 2020-10-09 DIAGNOSIS — R69 Illness, unspecified: Secondary | ICD-10-CM | POA: Diagnosis not present

## 2020-10-14 DIAGNOSIS — R5383 Other fatigue: Secondary | ICD-10-CM | POA: Diagnosis not present

## 2020-10-16 DIAGNOSIS — R69 Illness, unspecified: Secondary | ICD-10-CM | POA: Diagnosis not present

## 2020-10-22 DIAGNOSIS — H5203 Hypermetropia, bilateral: Secondary | ICD-10-CM | POA: Diagnosis not present

## 2020-10-22 DIAGNOSIS — H5000 Unspecified esotropia: Secondary | ICD-10-CM | POA: Diagnosis not present

## 2020-10-22 DIAGNOSIS — H2513 Age-related nuclear cataract, bilateral: Secondary | ICD-10-CM | POA: Diagnosis not present

## 2020-10-25 DIAGNOSIS — R69 Illness, unspecified: Secondary | ICD-10-CM | POA: Diagnosis not present

## 2020-10-28 ENCOUNTER — Ambulatory Visit: Payer: Medicare HMO | Admitting: Neurology

## 2020-10-28 ENCOUNTER — Encounter: Payer: Self-pay | Admitting: Neurology

## 2020-10-28 VITALS — BP 121/73 | HR 72 | Ht 67.0 in | Wt 146.0 lb

## 2020-10-28 DIAGNOSIS — G603 Idiopathic progressive neuropathy: Secondary | ICD-10-CM | POA: Diagnosis not present

## 2020-10-28 NOTE — Progress Notes (Signed)
Reason for visit: Peripheral neuropathy  Heather Goodwin is an 70 y.o. 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  The patient has noted some numbness and tingling in the lower extremities up to mid calf level, she does not have a lot of discomfort.  Several weeks ago she had a 2-day timeframe of some discomfort in the back, primarily on the right.  She denies any sciatica type pain.  She continues to remain active, she plays tennis on a regular basis without difficulty.  She has not noted any balance changes.  She has no increase in symptomatology with prolonged walking.  She has had some chronic fatigue following a COVID virus infection in December 2020.  She returns for further evaluation.  Blood work done previously was unremarkable.  Past Medical History:  Diagnosis Date  . Beta thalassemia trait   . Foot fracture    at gym  . Peripheral neuropathy 04/01/2020    Past Surgical History:  Procedure Laterality Date  . left knee surgery  2009   meniscus   . US ECHOCARDIOGRAPHY  2005   normal for syncope     Family History  Problem Relation Age of Onset  . Thyroid disease Mother        partial thyroidectomy  . Pancreatic cancer Mother 60  . Cirrhosis Father        secondary to chronic hepatitis b infection  . Other Son        B Thal trait  . Breast cancer Paternal Aunt 37  . Breast cancer Maternal Aunt 80  . Pancreatic cancer Maternal Grandfather        dx 75-80  . Melanoma Brother 61  . Colon cancer Neg Hx   . Esophageal cancer Neg Hx   . Rectal cancer Neg Hx     Social history:  reports that she has never smoked. She has never used smokeless tobacco. She reports current alcohol use of about 1.0 - 4.0 standard drink of alcohol per week. She reports that she does not use drugs.    Allergies  Allergen Reactions  . No Known Allergies      Medications:  Prior to Admission medications   Medication Sig Start Date End Date Taking? Authorizing Provider  colestipol (COLESTID) 1 g tablet TAKE 1 TABLET BY MOUTH TWICE A DAY 06/13/20  Yes Nandigam, Kavitha V, MD  hydroquinone 4 % cream APPLY A SMALL AMOUNT TO SKIN ONCE A DAY 09/14/19  Yes [provider]  Wheat Dextrin (BENEFIBER DRINK MIX PO) Take by mouth in the morning and at bedtime. 3 teaspoons two times  daily   Yes [provider]    ROS:  Out of a complete 14 system review of symptoms, the patient complains only of the following symptoms, and all other reviewed systems are negative.  Numbness in the feet Fatigue  Blood pressure 121/73, pulse 72, height 5\' 7"  (1.702 m), weight 146 lb (66.2 kg).  Physical Exam  General: The patient is alert and cooperative at the time of the examination.  Neuromuscular: Range of movement of the lumbar spine was full and normal.  Skin: No significant peripheral edema is noted.   Neurologic Exam  Mental status: The patient is alert and oriented x 3 at the time of the examination. The patient has apparent normal recent and remote memory, with an apparently  normal attention span and concentration ability.   Cranial nerves: Facial symmetry is present. Speech is normal, no aphasia or dysarthria is noted. Extraocular movements are full. Visual fields are full.  Motor: The patient has good strength in all 4 extremities.  Sensory examination: Soft touch sensation is symmetric on the face, arms, and legs.  Coordination: The patient has good finger-nose-finger and heel-to-shin bilaterally.  Gait and station: The patient has a normal gait. Tandem gait is normal. Romberg is negative. No drift is seen.  Reflexes: Deep tendon reflexes are symmetric.   Assessment/Plan:  1.  Motor neuropathy  The patient is doing well currently, she has some sensory symptoms in the legs, but otherwise this has not impaired her ability  to perform any activity daily living.  She is physically quite active, we will follow on a conservative basis at this point.  If she has any changes in her condition we may consider MRI of the lumbar spine in the future.   Jill Alexanders MD 10/28/2020 9:35 AM  [facility] [address] [place], [place] [address]  Phone [phone] Fax [phone]

## 2020-10-29 ENCOUNTER — Other Ambulatory Visit: Payer: Medicare HMO

## 2020-10-29 ENCOUNTER — Ambulatory Visit: Payer: Medicare HMO | Attending: Internal Medicine

## 2020-10-29 DIAGNOSIS — Z20822 Contact with and (suspected) exposure to covid-19: Secondary | ICD-10-CM

## 2020-10-30 LAB — SARS-COV-2, NAA 2 DAY TAT

## 2020-10-30 LAB — NOVEL CORONAVIRUS, NAA: SARS-CoV-2, NAA: NOT DETECTED

## 2020-12-04 DIAGNOSIS — R69 Illness, unspecified: Secondary | ICD-10-CM | POA: Diagnosis not present

## 2020-12-10 DIAGNOSIS — R69 Illness, unspecified: Secondary | ICD-10-CM | POA: Diagnosis not present

## 2020-12-20 ENCOUNTER — Other Ambulatory Visit: Payer: Self-pay | Admitting: Gastroenterology

## 2021-01-02 DIAGNOSIS — R69 Illness, unspecified: Secondary | ICD-10-CM | POA: Diagnosis not present

## 2021-01-06 DIAGNOSIS — R69 Illness, unspecified: Secondary | ICD-10-CM | POA: Diagnosis not present

## 2021-01-11 IMAGING — DX DG CHEST 2V
2 series · 2 of 2 positions shown · non-contrast
Comparison: None.

CLINICAL DATA: Shortness of breath.  Remote history of DJCI8-PS.

EXAM:
CHEST - 2 VIEW

[chest pa]
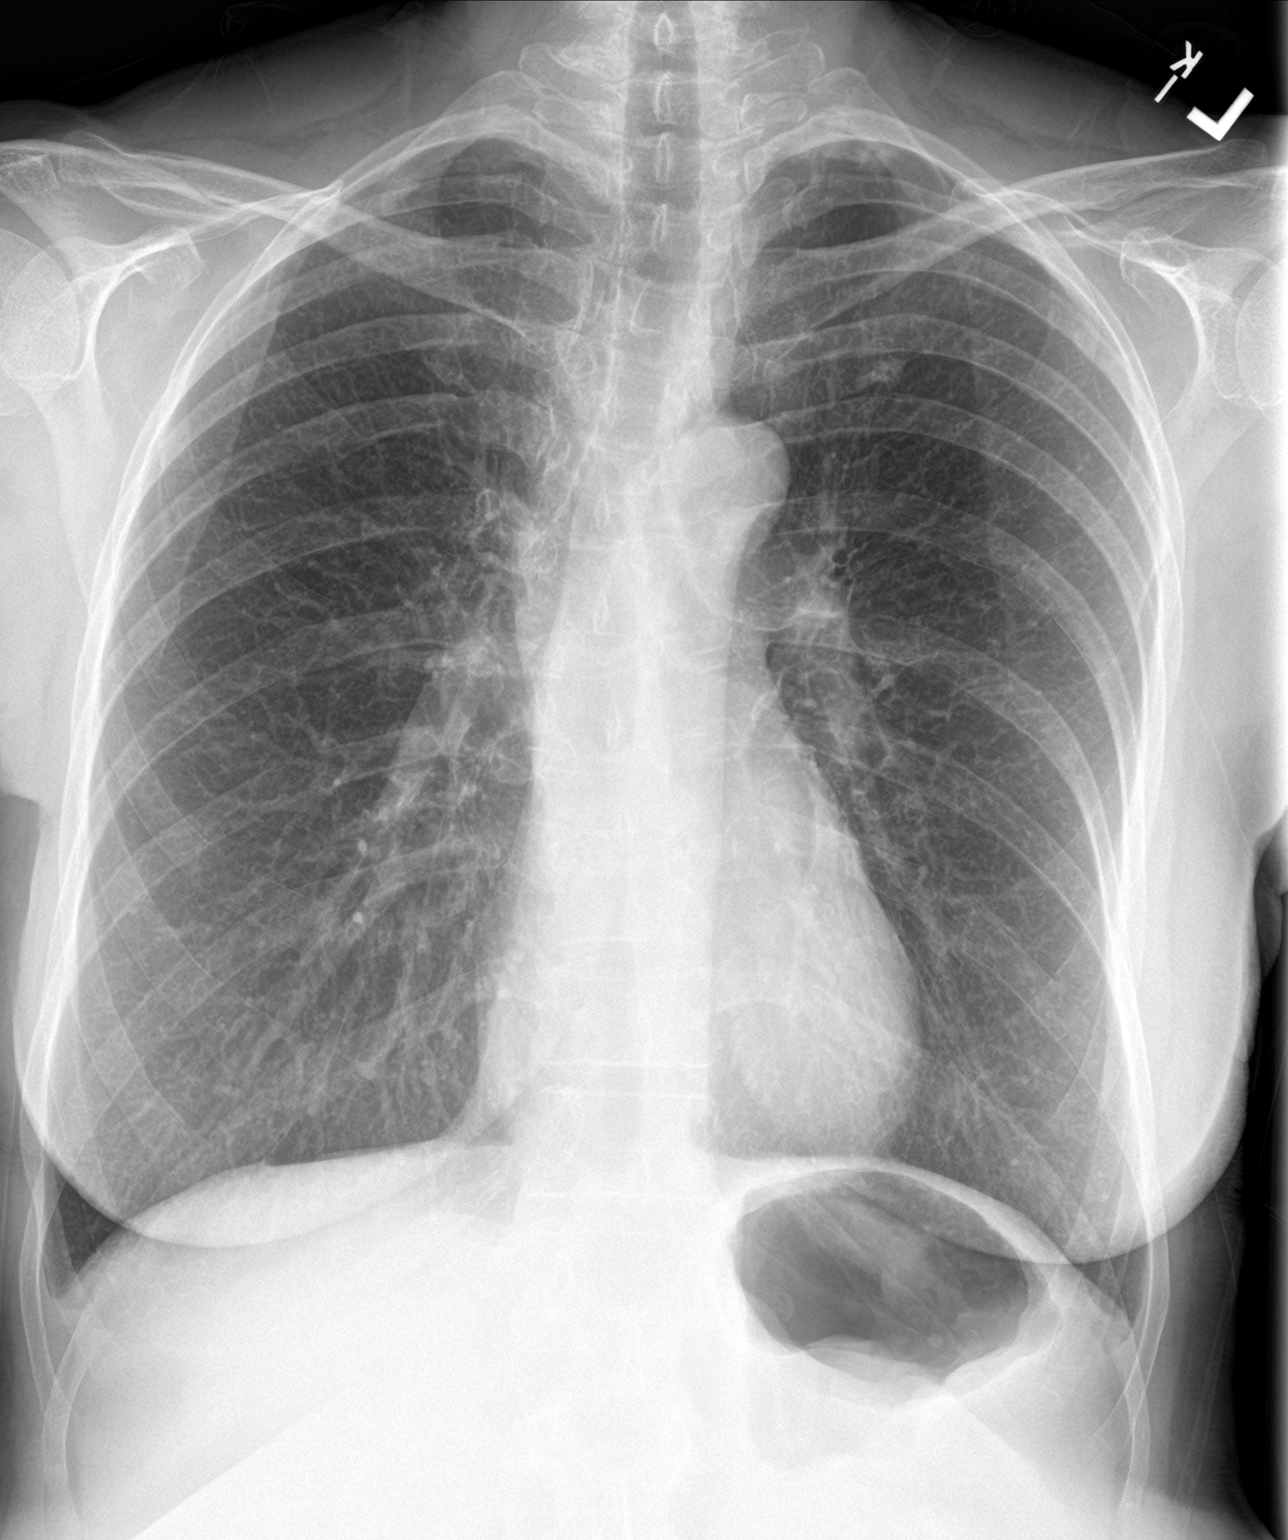

[chest lat]
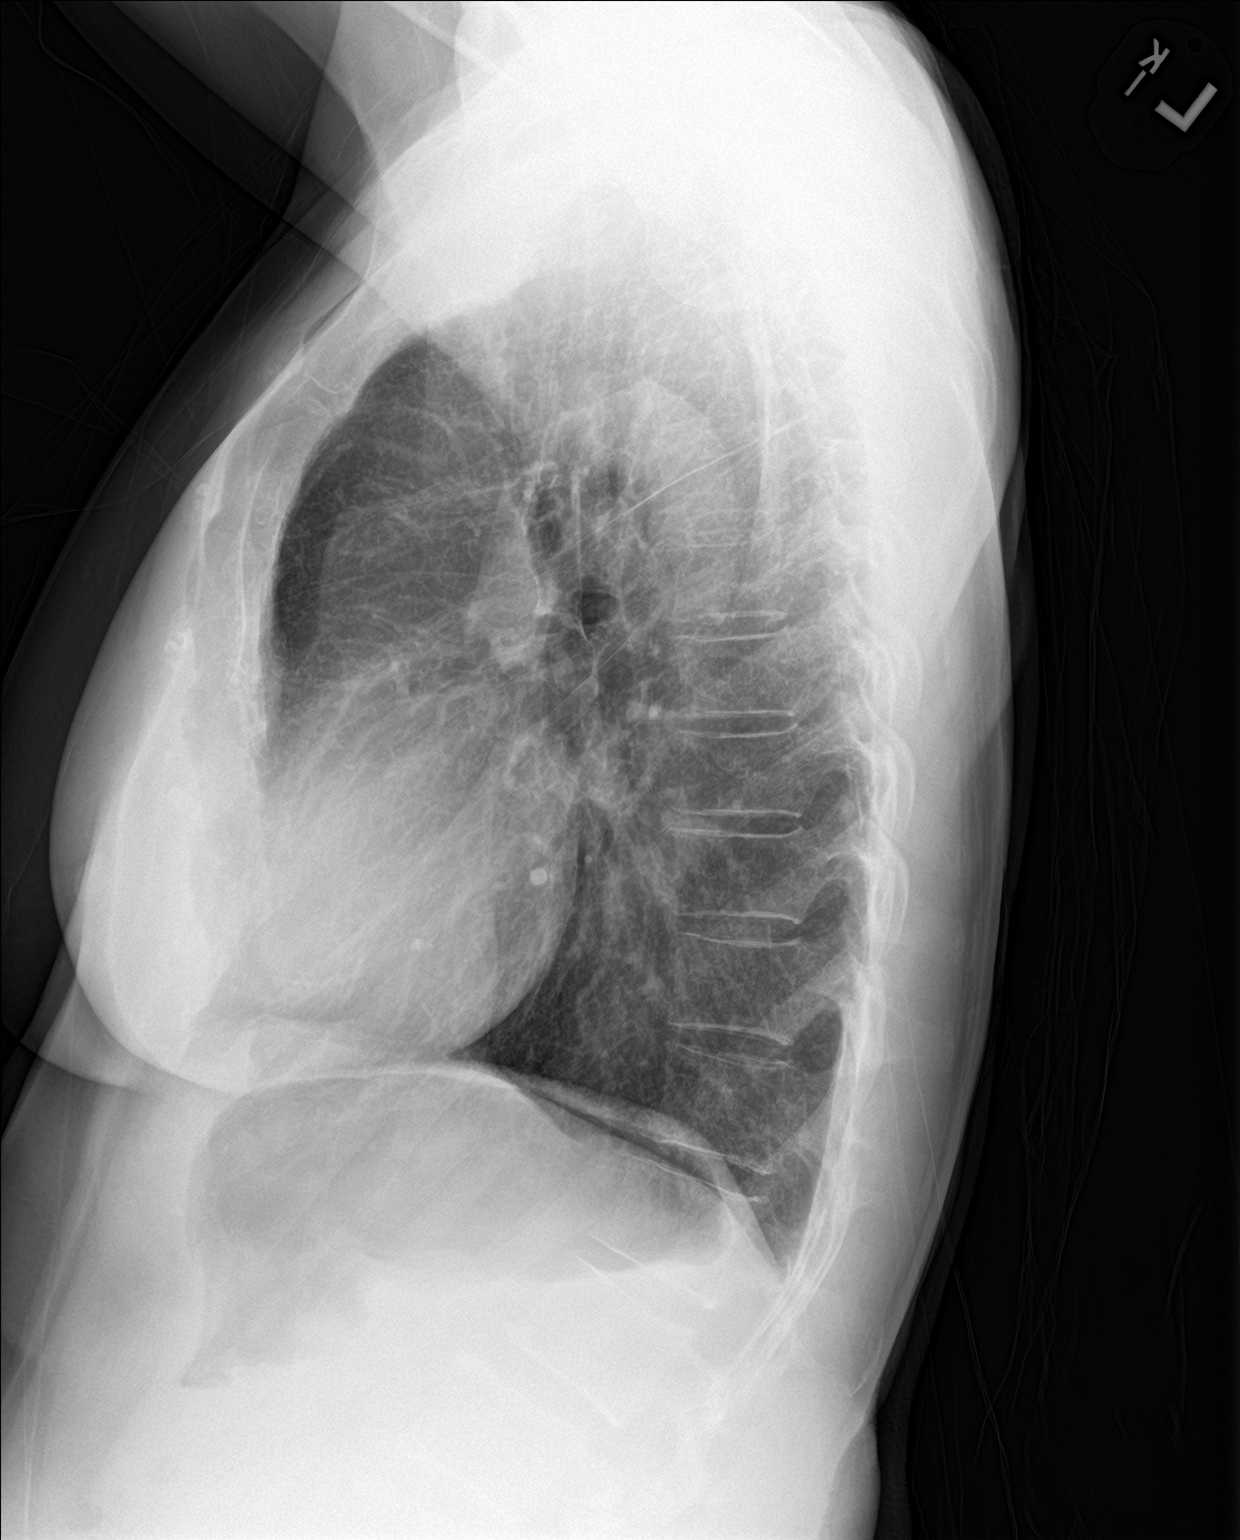

[2 of 2 positions shown; findings below may reference images not displayed]

FINDINGS: Clear lungs.

No pneumothorax or pleural effusion.

Cardiomediastinal silhouette is within normal limits.

No acute osseous abnormality.
IMPRESSION: No focal airspace disease.

## 2021-01-13 ENCOUNTER — Encounter: Payer: Self-pay | Admitting: Internal Medicine

## 2021-01-13 ENCOUNTER — Telehealth (INDEPENDENT_AMBULATORY_CARE_PROVIDER_SITE_OTHER): Payer: Medicare HMO | Admitting: Internal Medicine

## 2021-01-13 VITALS — HR 98 | Ht 67.0 in | Wt 146.0 lb

## 2021-01-13 DIAGNOSIS — J988 Other specified respiratory disorders: Secondary | ICD-10-CM | POA: Diagnosis not present

## 2021-01-13 DIAGNOSIS — U071 COVID-19: Secondary | ICD-10-CM

## 2021-01-13 NOTE — Progress Notes (Signed)
Virtual Visit via Video Note  I connected withNAME@ on 01/13/21 at  4:00 PM EDT by a video enabled telemedicine application and verified that I am speaking with the correct person using two identifiers. Location patient: home Location provider:work office Persons participating in the virtual visit: patient, provider  [place]th national recommendations  regarding COVID 19 pandemic   video visit is advised over in office visit for this patient.  Patient aware  of the limitations of evaluation and management by telemedicine and  availability of in person appointments. and agreed to proceed.   HPI: Heather Goodwin presents for video visit because of new onset respiratory symptoms and a positive rapid home test for COVID-19 this morning. In her usual state of health until 2 to 3 days ago when she was out of town and noticed some fatigue and then upper respiratory congestion and then low-grade fever in the 100.6 range.  No serious shortness of breath or serious pain. Past history has had COVID infection 05/2019.  Uncomplicated and then 2 doses of Pfizer vaccine in 2021  She may have got exposed at a group gathering although husband tested positive today has had a cough for couple weeks but then is also been around people at the golf club who had a positive test.   ROS: See pertinent positives and negatives per HPI.  She has mild distal neuropathy she feels from the COVID-vaccine.   as well as ringing in the ears.  No current change of taste and smell  Past Medical History:  Diagnosis Date   Beta thalassemia trait    Foot fracture    at gym   Peripheral neuropathy 04/01/2020    Past Surgical History:  Procedure Laterality Date   left knee surgery  2009   meniscus    US ECHOCARDIOGRAPHY  2005   normal for syncope     Family History  Problem Relation Age of Onset   Thyroid disease Mother        partial thyroidectomy   Pancreatic cancer Mother 92   Cirrhosis Father        secondary to  chronic hepatitis b infection   Other Son        B Thal trait   Breast cancer Paternal Aunt 76   Breast cancer Maternal Aunt 80   Pancreatic cancer Maternal Grandfather        dx 75-80   Melanoma Brother 23   Colon cancer Neg Hx    Esophageal cancer Neg Hx    Rectal cancer Neg Hx     Social History   Tobacco Use   Smoking status: Never   Smokeless tobacco: Never  Vaping Use   Vaping Use: Never used  Substance Use Topics   Alcohol use: Yes    Alcohol/week: 1.0 - 4.0 standard drink    Types: 1 - 4 Glasses of wine per week    Comment: occ   Drug use: No      Current Outpatient Medications:    colestipol (COLESTID) 1 g tablet, TAKE 1 TABLET BY MOUTH TWICE A DAY, Disp: 180 tablet, Rfl: 1   hydroquinone 4 % cream, APPLY A SMALL AMOUNT TO SKIN ONCE A DAY, Disp: , Rfl:    Wheat Dextrin (BENEFIBER DRINK MIX PO), Take by mouth in the morning and at bedtime. 3 teaspoons two times  daily, Disp: , Rfl:   EXAM: BP Readings from Last 3 Encounters:  10/28/20 121/73  07/03/20 102/78  03/05/20 105/68  VITALS per patient if applicable:  GENERAL: alert, oriented, appears well and in no acute distress nontoxic  HEENT: atraumatic, conjunttiva clear, no obvious abnormalities on inspection of external nose and ears mild to moderate congestion no edema normal speech and cognition  NECK: normal movements of the head and neck  LUNGS: on inspection no signs of respiratory distress, breathing rate appears normal, no obvious gross SOB, gasping or wheezing  CV: no obvious cyanosis  MS: moves all visible extremities without noticeable abnormality  PSYCH/NEURO: pleasant and cooperative, no obvious depression or anxiety, speech and thought processing grossly intact   ASSESSMENT AND PLAN:  Discussed the following assessment and plan:    ICD-10-CM   1. Respiratory tract infection due to COVID-19 virus  U07.1    J98.8    Day 3      Discussed risk benefit of Paxlovid  complication  risk score is 1 based on age alone. Options discussed should be taken within the first 5 days of symptoms She can call me or contact me tomorrow if she is feeling worse or high fevers we can begin antivirals. Otherwise supportive care and isolation. Counseled.   Expectant management and discussion of plan and treatment with opportunity to ask questions and all were answered. The patient agreed with the plan and demonstrated an understanding of the instructions.   Advised to call back or seek an in-person evaluation if worsening  or having  further concerns . Return if symptoms worsen or fail to improve, for contact tomorrow consider  paxlovid.   Shanon Ace, MD

## 2021-01-29 DIAGNOSIS — R69 Illness, unspecified: Secondary | ICD-10-CM | POA: Diagnosis not present

## 2021-02-04 ENCOUNTER — Encounter: Payer: Self-pay | Admitting: Licensed Clinical Social Worker

## 2021-02-04 DIAGNOSIS — R69 Illness, unspecified: Secondary | ICD-10-CM | POA: Diagnosis not present

## 2021-02-04 NOTE — Progress Notes (Signed)
UPDATE: VUS in MSH6 called c.3070C>T and VUS in RAD50 c.3843A>G have been reclassified to "Likely Benign." The report date is 02/03/2021.   

## 2021-02-11 DIAGNOSIS — R69 Illness, unspecified: Secondary | ICD-10-CM | POA: Diagnosis not present

## 2021-02-27 DIAGNOSIS — R69 Illness, unspecified: Secondary | ICD-10-CM | POA: Diagnosis not present

## 2021-02-27 DIAGNOSIS — F4322 Adjustment disorder with anxiety: Secondary | ICD-10-CM | POA: Diagnosis not present

## 2021-03-06 ENCOUNTER — Encounter: Payer: Self-pay | Admitting: Internal Medicine

## 2021-03-06 ENCOUNTER — Ambulatory Visit (INDEPENDENT_AMBULATORY_CARE_PROVIDER_SITE_OTHER): Payer: Medicare HMO | Admitting: Internal Medicine

## 2021-03-06 ENCOUNTER — Other Ambulatory Visit: Payer: Self-pay

## 2021-03-06 VITALS — BP 118/80 | HR 88 | Temp 98.4°F | Ht 67.0 in | Wt 142.2 lb

## 2021-03-06 DIAGNOSIS — H9313 Tinnitus, bilateral: Secondary | ICD-10-CM

## 2021-03-06 DIAGNOSIS — G479 Sleep disorder, unspecified: Secondary | ICD-10-CM

## 2021-03-06 DIAGNOSIS — R5383 Other fatigue: Secondary | ICD-10-CM

## 2021-03-06 DIAGNOSIS — G609 Hereditary and idiopathic neuropathy, unspecified: Secondary | ICD-10-CM

## 2021-03-06 DIAGNOSIS — M542 Cervicalgia: Secondary | ICD-10-CM

## 2021-03-06 DIAGNOSIS — Z2821 Immunization not carried out because of patient refusal: Secondary | ICD-10-CM | POA: Diagnosis not present

## 2021-03-06 DIAGNOSIS — Z8616 Personal history of COVID-19: Secondary | ICD-10-CM

## 2021-03-06 DIAGNOSIS — Z0000 Encounter for general adult medical examination without abnormal findings: Secondary | ICD-10-CM

## 2021-03-06 NOTE — Patient Instructions (Signed)
Will notify you  of labs when available.   Extra fatigue could be from recent covid infection. Neck headache could be cervicogenic headache often from neck arthritis  pain etc.   Let me know if you want to try low dose  muscle relaxant at night. To see if helps.

## 2021-03-06 NOTE — Progress Notes (Signed)
Chief Complaint  Patient presents with   Annual Exam    HPI: Patient  Heather Goodwin  70 y.o. comes in today for Preventive Health Care visit  Ringing in ears.  Hearing tests.  Borderline.  Had   covid   second time about 5-6 weeks ago  and mild sx but not quite back  ..zero energy.  No sob aggravation of tinnitus  Sleep never right Using sleep buds  and does ok  3 am . Can be hot but  no drenching sweats  has used  otc with some help  Appetite is off. Some .    Cut out sugars spo have tried to lose weight  Still has tingling in feet sock distribution.   Left neck tight and full and pain in back HA  with this last infection.   Health Maintenance  Topic Date Due   Hepatitis C Screening  Never done   PNA vac Low Risk Adult (1 of 2 - PCV13) Never done   Zoster Vaccines- Shingrix (2 of 2) 04/06/2018   COVID-19 Vaccine (3 - Pfizer risk series) 10/24/2019   INFLUENZA VACCINE  Never done   COLONOSCOPY (Pts 45-20yr Insurance coverage will need to be confirmed)  03/09/2021   MAMMOGRAM  09/12/2022   TETANUS/TDAP  10/07/2023   DEXA SCAN  Completed   HPV VACCINES  Aged Out   Health Maintenance Review LIFESTYLE:  Exercise:  very active  7 mile walks Tobacco/ETS:n Alcohol: little Sugar beverages:n Sleep:see above Drug use: no HH of 2 Work: own business   Has [employer]  ROS:  REST of 12 system review negative except as per HPI   Past Medical History:  Diagnosis Date   Beta thalassemia trait    Foot fracture    at gym   Peripheral neuropathy 04/01/2020    Past Surgical History:  Procedure Laterality Date   left knee surgery  2009   meniscus    [place]   normal for sync[place]     Family History  Problem Relation Age of Onset   Thyroid disease Mother        partial thyroidectomy   Pancreatic cancer Mother 736  Cirrhosis Father        secondary to chronic hepatitis b infection   Other Son        B Thal trait   Breast cancer  Paternal AAunt 37  Breast cancer Maternal Aunt 80   Pancreatic cancer Maternal Grandfather        dx 75-80   Melanoma Brother 657  Colon cancer Neg Hx    Esophageal cancer Neg Hx    Rectal cancer Neg Hx     Social History   Socioeconomic History   Marital status: Unknown    Spouse name: Not on file   Number of children: 3   Years of education: Not on file   Highest education level: Not on file  Occupational History   Occupation: Real Estate  Tobacco Use   Smoking status: Never   Smokeless tobacco: Never  Vaping Use   Vaping Use: Never used  Substance and Sexual Activity   Alcohol use: Yes    Alcohol/week: 1.0 - 4.0 standard drink    Types: 1 - 4 Glasses of wine per week    Comment: occ   Drug use: No   Sexual activity: Not on file  Other Topics Concern   Not on file  Social History Narrative   Lives at home with husband   2-3 caffeine drinks daily   Patient is right handed.         Social Determinants of Health   Financial Resource Strain: Not on file  Food Insecurity: Not on file  Transportation Needs: Not on file  Physical Activity: Not on file  Stress: Not on file  Social Connections: Not on file    Outpatient Medications Prior to Visit  Medication Sig Dispense Refill   colestipol (COLESTID) 1 g tablet TAKE 1 TABLET BY MOUTH TWICE A DAY 180 tablet 1   hydroquinone 4 % cream APPLY A SMALL AMOUNT TO SKIN ONCE A DAY     Wheat Dextrin (BENEFIBER DRINK MIX PO) Take by mouth in the morning and at bedtime. 3 teaspoons two times  daily     No facility-administered medications prior to visit.     EXAM:  BP 118/80 (BP Location: Left Arm, Patient Position: Sitting, Cuff Size: Normal)   Pulse 88   Temp 98.4 F (36.9 C) (Oral)   Ht '5\' 7"'$  (1.702 m)   Wt 142 lb 3.2 oz (64.5 kg)   SpO2 99%   BMI 22.27 kg/m   Body mass index is 22.27 kg/m. Wt Readings from Last 3 Encounters:  03/06/21 142 lb 3.2 oz (64.5 kg)  01/13/21 146 lb (66.2 kg)  10/28/20 146 lb  (66.2 kg)    Physical Exam: Vital signs reviewed [id number] is a well-developed well-nourished alert cooperative    who appearsr stated age in no acute distress.  HEENT: normocephalic atraumatic , Eyes: PERRL EOM's full, conjunctiva clear, Nares: paten,t no deformity discharge or tenderness., Ears: no deformity EAC's clear TMs with normal landmarks. Mouth: clear OP,masked  NECK: supple without masses, thyromegaly or bruits. Tight left paracervicl muscles  no mass but full  CHEST/PULM:  Clear to auscultation and percussion breath sounds equal no wheeze , rales or rhonchi. No chest wall deformities or tenderness. CV: PMI is nondisplaced, S1 S2 no gallops, murmurs, rubs. Peripheral pulses are full without delay.No JVD .  ABDOMEN: Bowel sounds normal nontender  No guard or rebound, no hepato splenomegal no CVA tenderness.   Extremtities:  No clubbing cyanosis or edema, no acute joint swelling or redness no focal atrophy NEURO:  Oriented x3, cranial nerves 3-12 appear to be intact, no obvious focal weakness,gait within normal limits  SKIN: No acute rashes normal turgor, color, no bruising or petechiae. PSYCH: Oriented, good eye contact, no obvious depression anxiety, cognition and judgment appear normal. LN: no cervical axillary inguinal adenopathy  Lab Results  Component Value Date   WBC 5.9 10/03/2019   HGB 12.4 10/03/2019   HCT 38.7 10/03/2019   PLT 222.0 10/03/2019   GLUCOSE 98 10/03/2019   CHOL 222 (H) 10/03/2019   TRIG 96.0 10/03/2019   HDL 70.50 10/03/2019   LDLDIRECT 123.1 03/02/2011   LDLCALC 132 (H) 10/03/2019   ALT 13 10/03/2019   AST 14 10/03/2019   NA 137 10/03/2019   K 4.0 10/03/2019   CL 101 10/03/2019   CREATININE 0.73 10/03/2019   BUN 13 10/03/2019   CO2 30 10/03/2019   TSH 1.85 10/03/2019    BP Readings from Last 3 Encounters:  03/06/21 118/80  10/28/20 121/73  07/03/20 102/78   Lab Results  Component Value Date   [id number] 10/03/2019   Lab plan NF  reviewed with patient  Declined flu vaccine ASSESSMENT AND PLAN:  Discussed the following assessment and plan:  ICD-10-CM   1. Visit for preventive health examination  Z00.00     2. Idiopathic neuropathy  [id number] Basic metabolic panel    CBC with Differential/Platelet    Hepatic function panel    TSH    T4, free    Lipid panel    Sedimentation rate    C-reactive protein    C-reactive protein    Sedimentation rate    Lipid panel    T4, free    TSH    Hepatic function panel    CBC with Differential/Platelet    Basic metabolic panel    3. Low energy  123456 Basic metabolic panel    CBC with Differential/Platelet    Hepatic function panel    TSH    T4, free    Lipid panel    Sedimentation rate    C-reactive protein    C-reactive protein    Sedimentation rate    Lipid panel    T4, free    TSH    Hepatic function panel    CBC with Differential/Platelet    Basic metabolic panel    4. History of 2019 novel coronavirus disease (COVID-19)  [id number] Basic metabolic panel    CBC with Differential/Platelet    Hepatic function panel    TSH    T4, free    Lipid panel    Sedimentation rate    C-reactive protein    C-reactive protein    Sedimentation rate    Lipid panel    T4, free    TSH    Hepatic function panel    CBC with Differential/Platelet    Basic metabolic panel    5. Tinnitus aurium, bilateral  [id number] Basic metabolic panel    CBC with Differential/Platelet    Hepatic function panel    TSH    T4, free    Lipid panel    Sedimentation rate    C-reactive protein    C-reactive protein    Sedimentation rate    Lipid panel    T4, free    TSH    Hepatic function panel    CBC with Differential/Platelet    Basic metabolic panel    6. Neck pain  123456 Basic metabolic panel    CBC with Differential/Platelet    Hepatic function panel    TSH    T4, free    Lipid panel    Sedimentation rate    C-reactive protein    C-reactive protein    Sedimentation  rate    Lipid panel    T4, free    TSH    Hepatic function panel    CBC with Differential/Platelet    Basic metabolic panel   headache     7. Influenza vaccination declined  Z28.21     8. Sleep disturbance  G47.9    2-4 am wakening  maintenance of sleep impaired  long standing    Update labs   fextra fatigue may be   post covid  sx but not a severe case .   Neuropathy sx worse after  second mrna vaccine  as well as  tinnitus  active lifestyle now.  Let us now if want to try   med such as trazodone gabapentin other  for short term  Return for depending on results and how doing.  Patient Care Team: Bunny Lowdermilk, Standley Brooking, MD as PCP - General Azucena Fallen, MD (Obstetrics and Gynecology) Wylene Simmer, MD as Consulting Physician (Orthopedic Surgery) Patient Instructions  Will notify you  of labs when available.   Extra fatigue could be from recent covid infection. Neck headache could be cervicogenic headache often from neck arthritis  pain etc.   Let me know if you want to try low dose  muscle relaxant at night. To see if helps.         Standley Brooking. Nikolay Demetriou M.D.

## 2021-03-07 LAB — CBC WITH DIFFERENTIAL/PLATELET
Basophils Absolute: 0 10*3/uL (ref 0.0–0.1)
Basophils Relative: 0.6 % (ref 0.0–3.0)
Eosinophils Absolute: 0.1 10*3/uL (ref 0.0–0.7)
Eosinophils Relative: 2.2 % (ref 0.0–5.0)
HCT: 38.7 % (ref 36.0–46.0)
Hemoglobin: 12.3 g/dL (ref 12.0–15.0)
Lymphocytes Relative: 28.8 % (ref 12.0–46.0)
Lymphs Abs: 1.8 10*3/uL (ref 0.7–4.0)
MCHC: 31.7 g/dL (ref 30.0–36.0)
MCV: 61.7 fl — ABNORMAL LOW (ref 78.0–100.0)
Monocytes Absolute: 0.4 10*3/uL (ref 0.1–1.0)
Monocytes Relative: 6.6 % (ref 3.0–12.0)
Neutro Abs: 3.9 10*3/uL (ref 1.4–7.7)
Neutrophils Relative %: 61.8 % (ref 43.0–77.0)
Platelets: 205 10*3/uL (ref 150.0–400.0)
RBC: 6.26 Mil/uL — ABNORMAL HIGH (ref 3.87–5.11)
RDW: 15.7 % — ABNORMAL HIGH (ref 11.5–15.5)
WBC: 6.3 10*3/uL (ref 4.0–10.5)

## 2021-03-07 LAB — HEPATIC FUNCTION PANEL
ALT: 13 U/L (ref 0–35)
AST: 16 U/L (ref 0–37)
Albumin: 4.4 g/dL (ref 3.5–5.2)
Alkaline Phosphatase: 46 U/L (ref 39–117)
Bilirubin, Direct: 0.1 mg/dL (ref 0.0–0.3)
Total Bilirubin: 0.5 mg/dL (ref 0.2–1.2)
Total Protein: 7.2 g/dL (ref 6.0–8.3)

## 2021-03-07 LAB — LIPID PANEL
Cholesterol: 221 mg/dL — ABNORMAL HIGH (ref 0–200)
HDL: 86.6 mg/dL (ref >39.00)
LDL Cholesterol: 121 mg/dL — ABNORMAL HIGH (ref 0–99)
NonHDL: 134.58
Total CHOL/HDL Ratio: 3
Triglycerides: 66 mg/dL (ref 0.0–149.0)
VLDL: 13.2 mg/dL (ref 0.0–40.0)

## 2021-03-07 LAB — BASIC METABOLIC PANEL
BUN: 13 mg/dL (ref 6–23)
CO2: 27 mEq/L (ref 19–32)
Calcium: 10 mg/dL (ref 8.4–10.5)
Chloride: 102 mEq/L (ref 96–112)
Creatinine, Ser: 0.72 mg/dL (ref 0.40–1.20)
GFR: 86.2 mL/min (ref >60.00)
Glucose, Bld: 86 mg/dL (ref 70–99)
Potassium: 3.9 mEq/L (ref 3.5–5.1)
Sodium: 138 mEq/L (ref 135–145)

## 2021-03-07 LAB — TSH: TSH: 1.92 u[IU]/mL (ref 0.35–5.50)

## 2021-03-07 LAB — T4, FREE: Free T4: 0.81 ng/dL (ref 0.60–1.60)

## 2021-03-07 LAB — SEDIMENTATION RATE: Sed Rate: 4 mm/hr (ref 0–30)

## 2021-03-07 LAB — C-REACTIVE PROTEIN: CRP: <1 mg/dL (ref 0.5–20.0)

## 2021-03-09 NOTE — Progress Notes (Signed)
Results normal stable of favorable. The 10-year ASCVD risk score (Arnett DK, et al., 2019) is: 5.3%   Values used to calculate the score:     Age: 70 years     Sex: Female     Is Non-Hispanic African American: No     Diabetic: No     Tobacco smoker: No     Systolic Blood Pressure: 123456 mmHg     Is BP treated: No     HDL Cholesterol: 86.6 mg/dL     Total Cholesterol: 221 mg/dL

## 2021-03-12 ENCOUNTER — Encounter: Payer: Medicare HMO | Admitting: Internal Medicine

## 2021-03-12 DIAGNOSIS — R69 Illness, unspecified: Secondary | ICD-10-CM | POA: Diagnosis not present

## 2021-03-12 DIAGNOSIS — F4323 Adjustment disorder with mixed anxiety and depressed mood: Secondary | ICD-10-CM | POA: Diagnosis not present

## 2021-03-25 ENCOUNTER — Other Ambulatory Visit: Payer: Medicare HMO

## 2021-04-30 ENCOUNTER — Telehealth (INDEPENDENT_AMBULATORY_CARE_PROVIDER_SITE_OTHER): Payer: Medicare HMO | Admitting: Internal Medicine

## 2021-04-30 ENCOUNTER — Encounter: Payer: Self-pay | Admitting: Internal Medicine

## 2021-04-30 VITALS — Ht 67.0 in | Wt 142.2 lb

## 2021-04-30 DIAGNOSIS — R5383 Other fatigue: Secondary | ICD-10-CM

## 2021-04-30 DIAGNOSIS — M542 Cervicalgia: Secondary | ICD-10-CM

## 2021-04-30 DIAGNOSIS — Z7189 Other specified counseling: Secondary | ICD-10-CM | POA: Diagnosis not present

## 2021-04-30 NOTE — Telephone Encounter (Signed)
Lets do a  virtual visit at end of day  430?

## 2021-04-30 NOTE — Telephone Encounter (Signed)
Pt has been added to schedule for today.

## 2021-04-30 NOTE — Progress Notes (Signed)
Virtual Visit via Video Note  I connected withCatharyn J Goodwin on 05/03/21 at  4:30 PM EST by a video enabled telemedicine application and verified that I am speaking with the correct person using two identifiers. Location patient:work place  Location provider:work office Persons participating in the virtual visit: patient, provider  [place]th national recommendations  regarding COVID 19 pandemic   video visit is advised over in office visit for this patient.  Patient aware  of the limitations of evaluation and management by telemedicine and  availability of in person appointments. and agreed to proceed.   HPI: Heather Goodwin presents for video visit  for fu issues see messaging .  Was concerned could she hav a bat bite and not know it?   In June had  bat in house not touched taken outside and again a dead one in kitchen .  No know bite or other knowledge of bat in house or sleeping area.   Had work outside and later noted  2 small dots in sking with some brusing? Not really painful no numbness and no itch.  This past  week.   Ask some info about  poss of rabies exposures.   Not hungry  but has intentional weight loss . Neck pain on going at times  radiated to back of head  no radiation down arms  or other sx.  Sleep  interrupted.  Not as much exercise as  fatigue but not cps sob used to do 8 miles in weekends  still dow 40 minutes every am and tennis and other physical activities  ROS: See pertinent positives and negatives per HPI.  Past Medical History:  Diagnosis Date   Beta thalassemia trait    Foot fracture    at gym   Peripheral neuropathy 04/01/2020    Past Surgical History:  Procedure Laterality Date   left knee surgery  2009   meniscus    US ECHOCARDIOGRAPHY  2005   normal for syncope     Family History  Problem Relation Age of Onset   Thyroid disease Mother        partial thyroidectomy   Pancreatic cancer Mother 64   Cirrhosis Father        secondary to chronic  hepatitis b infection   Other Son        B Thal trait   Breast cancer Paternal Aunt 91   Breast cancer Maternal Aunt 80   Pancreatic cancer Maternal Grandfather        dx 75-80   Melanoma Brother 21   Colon cancer Neg Hx    Esophageal cancer Neg Hx    Rectal cancer Neg Hx     Social History   Tobacco Use   Smoking status: Never   Smokeless tobacco: Never  Vaping Use   Vaping Use: Never used  Substance Use Topics   Alcohol use: Yes    Alcohol/week: 1.0 - 4.0 standard drink    Types: 1 - 4 Glasses of wine per week    Comment: occ   Drug use: No      Current Outpatient Medications:    colestipol (COLESTID) 1 g tablet, TAKE 1 TABLET BY MOUTH TWICE A DAY, Disp: 180 tablet, Rfl: 1   hydroquinone 4 % cream, APPLY A SMALL AMOUNT TO SKIN ONCE A DAY, Disp: , Rfl:    Wheat Dextrin (BENEFIBER DRINK MIX PO), Take by mouth in the morning and at bedtime. 3 teaspoons two times  daily, Disp: ,  Rfl:   EXAM: BP Readings from Last 3 Encounters:  03/06/21 118/80  10/28/20 121/73  07/03/20 102/78    VITALS per patient if applicable:  GENERAL: alert, oriented, appears well and in no acute distress  HEENT: atraumatic, conjunttiva clear, no obvious abnormalities on inspection of external nose and ears  NECK: normal movements of the head and neck  LUNGS: on inspection no signs of respiratory distress, breathing rate appears normal, no obvious gross SOB, gasping or wheezing  CV: no obvious cyanosis  MS: moves all visible extremities without noticeable abnormality  PSYCH/NEURO: pleasant and cooperative, no obvious depression or anxiety, speech and thought processing grossly intact Lab Results  Component Value Date   WBC 6.3 03/06/2021   HGB 12.3 03/06/2021   HCT 38.7 03/06/2021   PLT 205.0 03/06/2021   GLUCOSE 86 03/06/2021   CHOL 221 (H) 03/06/2021   TRIG 66.0 03/06/2021   HDL 86.60 03/06/2021   LDLDIRECT 123.1 03/02/2011   LDLCALC 121 (H) 03/06/2021   ALT 13 03/06/2021    AST 16 03/06/2021   NA 138 03/06/2021   K 3.9 03/06/2021   CL 102 03/06/2021   CREATININE 0.72 03/06/2021   BUN 13 03/06/2021   CO2 27 03/06/2021   TSH 1.92 03/06/2021    ASSESSMENT AND PLAN:  Discussed the following assessment and plan:    ICD-10-CM   1. Neck pain  M54.2     2. Other specified counseling  Z71.89    see  text     3. Other fatigue  R53.83      Bat exposure in not at risk based on hx  from June . Are of dots on legs would be highly unlikely to be from bat  if not noted     and most likely from  abration from working in the outdoor yard etc.   Neck still seems ms  poss  djd and ocipital ha at times .  Other sx  uncertain related  is very active  otherwise  and healthy for age  but if  persistent or progressive consider other eval .    Counseled.   Expectant management and discussion of plan and treatment with opportunity to ask questions and all were answered. The patient agreed with the plan and demonstrated an understanding of the instructions.   Advised to call back or seek an in-person evaluation if worsening  or having  further concerns  in interim. Return for when planned, as indicated.    Shanon Ace, MD

## 2021-05-22 ENCOUNTER — Encounter: Payer: Self-pay | Admitting: Gastroenterology

## 2021-06-20 ENCOUNTER — Telehealth: Payer: Self-pay | Admitting: Gastroenterology

## 2021-06-20 MED ORDER — COLESTIPOL HCL 1 G PO TABS: 1.0000 g | ORAL_TABLET | Freq: Two times a day (BID) | ORAL | 0 refills | Status: DC

## 2021-06-20 NOTE — Telephone Encounter (Signed)
I spoke with Heather Goodwin and confirmed pharmacy and how she is taking the colestipol--one BID. Per Dr Woodward Ku last office note in 01/2020 she was to come back in a year so she made an appointment for 07/29/21 and I sent in the refill for her.

## 2021-06-20 NOTE — Telephone Encounter (Signed)
Patient states she need a refill for Colestipol sent to fax order to [place], [place] on [address]. Fax [phone]. States if can't send the full refill she is in [place] for 4 days and would need enough to cover until she is back in town. Patient would like a call to confirm medication has been sent.

## 2021-07-16 ENCOUNTER — Ambulatory Visit
Admission: RE | Admit: 2021-07-16 | Discharge: 2021-07-16 | Disposition: A | Discharge status: Home / Self Care | Payer: Medicare HMO | Source: Ambulatory Visit | Attending: Obstetrics & Gynecology | Admitting: Obstetrics & Gynecology

## 2021-07-16 DIAGNOSIS — Z780 Asymptomatic menopausal state: Secondary | ICD-10-CM | POA: Diagnosis not present

## 2021-07-16 DIAGNOSIS — M810 Age-related osteoporosis without current pathological fracture: Secondary | ICD-10-CM | POA: Diagnosis not present

## 2021-07-16 DIAGNOSIS — M8580 Other specified disorders of bone density and structure, unspecified site: Secondary | ICD-10-CM

## 2021-07-16 DIAGNOSIS — M85852 Other specified disorders of bone density and structure, left thigh: Secondary | ICD-10-CM | POA: Diagnosis not present

## 2021-07-17 ENCOUNTER — Inpatient Hospital Stay: Admission: RE | Admit: 2021-07-17 | Payer: Medicare HMO | Source: Ambulatory Visit

## 2021-07-17 ENCOUNTER — Other Ambulatory Visit: Payer: Medicare HMO

## 2021-07-23 DIAGNOSIS — M810 Age-related osteoporosis without current pathological fracture: Secondary | ICD-10-CM | POA: Diagnosis not present

## 2021-07-29 ENCOUNTER — Encounter: Payer: Self-pay | Admitting: Gastroenterology

## 2021-07-29 ENCOUNTER — Ambulatory Visit: Payer: Medicare HMO | Admitting: Gastroenterology

## 2021-07-29 VITALS — BP 100/70 | HR 80 | Ht 67.0 in | Wt 146.0 lb

## 2021-07-29 DIAGNOSIS — K9089 Other intestinal malabsorption: Secondary | ICD-10-CM | POA: Diagnosis not present

## 2021-07-29 DIAGNOSIS — R159 Full incontinence of feces: Secondary | ICD-10-CM

## 2021-07-29 DIAGNOSIS — K5902 Outlet dysfunction constipation: Secondary | ICD-10-CM

## 2021-07-29 NOTE — Progress Notes (Signed)
Heather Goodwin    [id number]    Apr 14, 1951  Primary Care Physician:Panosh, Standley Brooking, MD  Referring Physician: Burnis Medin, MD [place],  [place] [id number]   Chief complaint:  Fecal incontinence  HPI:  71 year old very pleasant female here for follow-up visit for fecal incontinence and IBS-diarrhea.   She is having intermittent episodes of fecal incontinence.  She has incontinence of formed stool mostly when she goes on long hikes on trails or when she is exercising.  She has had 3 of 4 episodes in the past year.  Overall bowel habits have improved but she continues to have irregularity with incomplete evacuation.  She is taking Colestid and Benefiber.  Most days she has formed to semiformed bowel movements   Denies any nausea, vomiting, abdominal pain, melena or bright red blood per rectum   Colonoscopy in 2006: Normal  Colonoscopy 2012 with hyperplastic rectal polyp and internal hemorrhoids otherwise normal exam     Outpatient Encounter Medications as of 07/29/2021  Medication Sig   colestipol (COLESTID) 1 g tablet Take 1 tablet (1 g total) by mouth 2 (two) times daily.   Wheat Dextrin (BENEFIBER DRINK MIX PO) Take by mouth in the morning and at bedtime. 3 teaspoons two times  daily   [DISCONTINUED] hydroquinone 4 % cream APPLY A SMALL AMOUNT TO SKIN ONCE A DAY   No facility-administered encounter medications on file as of 07/29/2021.    Allergies as of 07/29/2021 - Review Complete 07/29/2021  Allergen Reaction Noted   No known allergies  09/15/2019    Past Medical History:  Diagnosis Date   Beta thalassemia trait    Foot fracture    at gym   Peripheral neuropathy 04/01/2020    Past Surgical History:  Procedure Laterality Date   left knee surgery  2009   meniscus    US ECHOCARDIOGRAPHY  2005   normal for syncope     Family History  Problem Relation Age of Onset   Thyroid disease Mother        partial thyroidectomy    Pancreatic cancer Mother 93   Cirrhosis Father        secondary to chronic hepatitis b infection   Other Son        B Thal trait   Breast cancer Paternal Aunt 3   Breast cancer Maternal Aunt 80   Pancreatic cancer Maternal Grandfather        dx 75-80   Melanoma Brother 12   Colon cancer Neg Hx    Esophageal cancer Neg Hx    Rectal cancer Neg Hx     Social History   Socioeconomic History   Marital status: Unknown    Spouse name: Not on file   Number of children: 3   Years of education: Not on file   Highest education level: Not on file  Occupational History   Occupation: Real Estate  Tobacco Use   Smoking status: Never   Smokeless tobacco: Never  Vaping Use   Vaping Use: Never used  Substance and Sexual Activity   Alcohol use: Yes    Alcohol/week: 1.0 - 4.0 standard drink    Types: 1 - 4 Glasses of wine per week    Comment: occ   Drug use: No   Sexual activity: Not on file  Other Topics Concern   Not on file  Social History Narrative   Lives at home with husband  2-3 caffeine drinks daily   Patient is right handed.         Social Determinants of Health   Financial Resource Strain: Not on file  Food Insecurity: Not on file  Transportation Needs: Not on file  Physical Activity: Not on file  Stress: Not on file  Social Connections: Not on file  Intimate Partner Violence: Not on file      Review of systems: All other review of systems negative except as mentioned in the HPI.   Physical Exam: Vitals:   07/29/21 0846  BP: 100/70  Pulse: 80   Body mass index is 22.87 kg/m. Gen:      No acute distress HEENT:  sclera anicteric Abd:      soft, non-tender; no palpable masses, no distension Ext:    No edema Neuro: alert and oriented x 3 Psych: normal mood and affect Rectal exam: Decreased anal sphincter tone, no anal fissure, small external hemorrhoids Anoscopy: Hard stool in the rectal vault, Small internal hemorrhoids, paradoxical contraction of  anal sphincter on bear down/attempted defecation  Data Reviewed:  Reviewed labs, radiology imaging, old records and pertinent past GI work up   Assessment and Plan/Recommendations:  71 year old very pleasant female with bile salt induced diarrhea, IBS diarrhea and fecal incontinence  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  Advised patient to not overuse it and to make it a habit.  Due for colonoscopy for colorectal cancer screening, will schedule it.  Return in 2 to 3 months  This visit required 40 minutes of patient care (this includes precharting, chart review, review of results, face-to-face time used for counseling as well as treatment plan and follow-up. The patient was provided an opportunity to ask questions and all were answered. The patient agreed with the plan and demonstrated an understanding of the instructions.  [name] , MD    CC: Panosh, Standley Brooking, MD

## 2021-07-29 NOTE — Patient Instructions (Signed)
If you are age 69 or older, your body mass index should be between 23-30. Your Body mass index is 22.87 kg/m. If this is out of the aforementioned range listed, please consider follow up with your Primary Care Provider.  If you are age 43 or younger, your body mass index should be between 19-25. Your Body mass index is 22.87 kg/m. If this is out of the aformentioned range listed, please consider follow up with your Primary Care Provider.   ________________________________________________________  The [facility] providers would like to encourage you to use [facility] to communicate with providers for non-urgent requests or questions.  Due to long hold times on the telephone, sending your provider a message by [facility] may be a faster and more efficient way to get a response.  Please allow 48 business hours for a response.  Please remember that this is for non-urgent requests.  _______________________________________________________   I appreciate the  opportunity to care for you  Thank You   Harl Bowie , MD

## 2021-08-06 ENCOUNTER — Telehealth: Payer: Self-pay | Admitting: Internal Medicine

## 2021-08-06 NOTE — Telephone Encounter (Signed)
Patent Asked about alendronate and recent bone density reviewed bone density done by OB/GYN reduction of T score in L-spine to -3 and then forearm -3 She is taking calcium and vitamin D.  She does regular exercise Negative family history of first-degree relative with hip fracture but a grandparent had a hip fracture.  Discussed that alendronate is first-line therapy ;may be able to limit to 3 to 5 years make sure her vitamin D is adequate.

## 2021-08-08 ENCOUNTER — Telehealth: Payer: Self-pay | Admitting: *Deleted

## 2021-08-08 ENCOUNTER — Other Ambulatory Visit: Payer: Self-pay | Admitting: Gastroenterology

## 2021-08-11 ENCOUNTER — Other Ambulatory Visit: Payer: Self-pay | Admitting: Obstetrics & Gynecology

## 2021-08-11 DIAGNOSIS — Z1231 Encounter for screening mammogram for malignant neoplasm of breast: Secondary | ICD-10-CM

## 2021-08-12 ENCOUNTER — Encounter: Payer: Self-pay | Admitting: Gastroenterology

## 2021-08-12 ENCOUNTER — Telehealth: Payer: Self-pay | Admitting: Gastroenterology

## 2021-08-12 ENCOUNTER — Ambulatory Visit: Payer: Medicare HMO | Admitting: Gastroenterology

## 2021-08-12 NOTE — Telephone Encounter (Signed)
Inbound call from patient to scheduled colonoscopy in [place]. Scheduled for PV 3/22 [place] 4/20. Patient states she need a follow up appt in 3 months. Is it 3 months after procedure or 3 months since last OV would make it May when the schedule is active?

## 2021-08-12 NOTE — Telephone Encounter (Signed)
Last office visit was in Feb 2023  3 months from the office appointment as long as you have completed the procedure that is scheduled   Put pt in for 4/21 She is waiting for our new schedule to come out Colon is on the 20th  I explained to her pathology wouldn't be back if one was needed yet

## 2021-08-27 NOTE — Telephone Encounter (Signed)
Erroneous encounter

## 2021-09-03 ENCOUNTER — Encounter: Payer: Self-pay | Admitting: Internal Medicine

## 2021-09-03 DIAGNOSIS — M810 Age-related osteoporosis without current pathological fracture: Secondary | ICD-10-CM

## 2021-09-08 NOTE — Telephone Encounter (Signed)
Thanks so much. 

## 2021-09-10 ENCOUNTER — Ambulatory Visit (AMBULATORY_SURGERY_CENTER): Payer: Medicare HMO | Admitting: *Deleted

## 2021-09-10 ENCOUNTER — Other Ambulatory Visit: Payer: Self-pay

## 2021-09-10 VITALS — Ht 67.0 in | Wt 146.0 lb

## 2021-09-10 DIAGNOSIS — Z1211 Encounter for screening for malignant neoplasm of colon: Secondary | ICD-10-CM

## 2021-09-10 MED ORDER — NA SULFATE-K SULFATE-MG SULF 17.5-3.13-1.6 GM/177ML PO SOLN: 1.0000 | ORAL | 0 refills | Status: DC

## 2021-09-10 NOTE — Progress Notes (Signed)
Patient's pre-visit was done today over the phone with the patient. Name,DOB and address verified. Patient denies any allergies to Eggs and Soy. Patient denies any problems with anesthesia/sedation. Patient is not taking any diet pills or blood thinners. No home Oxygen. Pt denies any changes in medical hx since last GI OV. ? ?Prep instructions sent to pt's MyChart-pt aware. Patient will use [facility] for suprep rx. Patient understands to call us back with any questions or concerns. Patient is aware of our care-partner policy and [facility] safety protocol.  ? ?EMMI education assigned to the patient for the procedure, sent to [facility].  ? ?The patient is COVID-19 vaccinated.   ?

## 2021-09-12 ENCOUNTER — Ambulatory Visit
Admission: RE | Admit: 2021-09-12 | Discharge: 2021-09-12 | Disposition: A | Discharge status: Home / Self Care | Payer: Medicare HMO | Source: Ambulatory Visit | Attending: Obstetrics & Gynecology | Admitting: Obstetrics & Gynecology

## 2021-09-12 DIAGNOSIS — Z1231 Encounter for screening mammogram for malignant neoplasm of breast: Secondary | ICD-10-CM | POA: Diagnosis not present

## 2021-09-12 DIAGNOSIS — Z01411 Encounter for gynecological examination (general) (routine) with abnormal findings: Secondary | ICD-10-CM | POA: Diagnosis not present

## 2021-09-12 DIAGNOSIS — Z01419 Encounter for gynecological examination (general) (routine) without abnormal findings: Secondary | ICD-10-CM | POA: Diagnosis not present

## 2021-09-12 DIAGNOSIS — Z0000 Encounter for general adult medical examination without abnormal findings: Secondary | ICD-10-CM | POA: Diagnosis not present

## 2021-09-12 DIAGNOSIS — M810 Age-related osteoporosis without current pathological fracture: Secondary | ICD-10-CM | POA: Diagnosis not present

## 2021-09-12 DIAGNOSIS — Z6823 Body mass index (BMI) 23.0-23.9, adult: Secondary | ICD-10-CM | POA: Diagnosis not present

## 2021-09-12 DIAGNOSIS — Z124 Encounter for screening for malignant neoplasm of cervix: Secondary | ICD-10-CM | POA: Diagnosis not present

## 2021-09-16 ENCOUNTER — Ambulatory Visit: Payer: Medicare HMO | Admitting: Gastroenterology

## 2021-09-19 ENCOUNTER — Encounter: Payer: Self-pay | Admitting: Internal Medicine

## 2021-09-19 ENCOUNTER — Ambulatory Visit: Payer: Medicare HMO | Admitting: Internal Medicine

## 2021-09-19 VITALS — BP 110/76 | HR 71 | Ht 67.0 in | Wt 149.6 lb

## 2021-09-19 DIAGNOSIS — M810 Age-related osteoporosis without current pathological fracture: Secondary | ICD-10-CM | POA: Diagnosis not present

## 2021-09-19 LAB — VITAMIN D 25 HYDROXY (VIT D DEFICIENCY, FRACTURES): VITD: 46.63 ng/mL (ref 30.00–100.00)

## 2021-09-19 NOTE — Patient Instructions (Addendum)
Please stop at the lab. ? ?Please reduce calcium citrate to 600 mg a day + multivitamin. ? ?Continue vitamin D 4000 units daily. ? ?Start OsteoStrong. ? ?Try to use wrist and ankle weights when you hike. ? ?Try to walk after you eat. ? ?How Can I Prevent Falls? ?Men and women with osteoporosis need to take care not to fall down. Falls can break bones. Some reasons people fall are: ?Poor vision  ?Poor balance  ?Certain diseases that affect how you walk  ?Some types of medicine, such as sleeping pills.  ?Some tips to help prevent falls outdoors are: ?Use a cane or walker  ?Wear rubber-soled shoes so you don't slip  ?Walk on grass when sidewalks are slippery  ?In winter, put salt or kitty litter on icy sidewalks.  ?Some ways to help prevent falls indoors are: ?Keep rooms free of clutter, especially on floors  ?Use plastic or carpet runners on slippery floors  ?Wear low-heeled shoes that provide good support  ?Do not walk in socks, stockings, or slippers  ?Be sure carpets and area rugs have skid-proof backs or are tacked to the floor  ?Be sure stairs are well lit and have rails on both sides  ?Put grab bars on bathroom walls near tub, shower, and toilet  ?Use a rubber bath mat in the shower or tub  ?Keep a flashlight next to your bed  ?Use a sturdy step stool with a handrail and wide steps  ?Add more lights in rooms (and night lights) ?Buy a cordless phone to keep with you so that you don't have to rush to the phone       when it rings and so that you can call for help if you fall.  ? ?(adapted from http://www.niams.NightlifePreviews.se) ? ?Please check out the following book about best diet for bone health: ? ? ?Exercise for Strong Bones (from [employer]) ?There are two types of exercises that are important for building and maintaining bone density:  weight-bearing and muscle-strengthening exercises. ?Weight-bearing Exercises ?These exercises include activities  that make you move against gravity while staying upright. Weight-bearing exercises can be high-impact or low-impact. ?High-impact weight-bearing exercises help build bones and keep them strong. If you have broken a bone due to osteoporosis or are at risk of breaking a bone, you may need to avoid high-impact exercises. If you?re not sure, you should check with your healthcare provider. ?Examples of high-impact weight-bearing exercises are: ?Dancing ?Doing high-impact aerobics ?Hiking ?Jogging/running ?Jumping Rope ?Stair climbing ?Tennis ?Low-impact weight-bearing exercises can also help keep bones strong and are a safe alternative if you cannot do high-impact exercises. Examples of low-impact weight-bearing exercises are: ?Using elliptical training machines ?Doing low-impact aerobics ?Using stair-step machines ?Fast walking on a treadmill or outside ?Muscle-Strengthening Exercises ?These exercises include activities where you move your body, a weight or some other resistance against gravity. They are also known as resistance exercises and include: ?Lifting weights ?Using elastic exercise bands ?Using weight machines ?Lifting your own body weight ?Functional movements, such as standing and rising up on your toes ?Yoga and Pilates can also improve strength, balance and flexibility. However, certain positions may not be safe for people with osteoporosis or those at increased risk of broken bones. For example, exercises that have you bend forward may increase the chance of breaking a bone in the spine. A physical therapist should be able to help you learn which exercises are safe and appropriate for you. ?Non-Impact Exercises ?Non-impact exercises can help you  to improve balance, posture and how well you move in everyday activities. These exercises can also help to increase muscle strength and decrease the risk of falls and broken bones. Some of these exercises include: ?Balance exercises that strengthen your legs and  test your balance, such as Tai Chi, can decrease your risk of falls. ?Posture exercises that improve your posture and reduce rounded or ?sloping? shoulders can help you decrease the chance of breaking a bone, especially in the spine. ?Functional exercises that improve how well you move can help you with everyday activities and decrease your chance of falling and breaking a bone. For example, if you have trouble getting up from a chair or climbing stairs, you should do these activities as exercises. ?A physical therapist can teach you balance, posture and functional exercises. ?Starting a New Exercise Program ?If you haven?t exercised regularly for a while, check with your healthcare provider before beginning a new exercise program--particularly if you have health problems such as heart disease, diabetes or high blood pressure. If you?re at high risk of breaking a bone, you should work with a physical therapist to develop a safe exercise program. ?Once you have your healthcare provider?s approval, start slowly. If you?ve already broken bones in the spine because of osteoporosis, be very careful to avoid activities that require reaching down, bending forward, rapid twisting motions, heavy lifting and those that increase your chance of a fall. ?As you get started, your muscles may feel sore for a day or two after you exercise. If soreness lasts longer, you may be working too hard and need to ease up. Exercises should be done in a pain-free range of motion. ?How Much Exercise Do You Need? ?Weight-bearing exercises 30 minutes on most days of the week. Do a 30-minutesession or multiple sessions spread out throughout the day. The benefits to your bones are the same.   ?Muscle-strengthening exercises Two to three days per week. If you don?t have much time for strengthening/resistance training, do small amounts at a time. You can do just one body part each day. For example do arms one day, legs the next and trunk the next.  You can also spread these exercises out during your normal day.  ?Balance, posture and functional exercises Every day or as often as needed. You may want to focus on one area more than the others. If you have fallen or lose your balance, spend time doing balance exercises. If you are getting rounded shoulders, work more on posture exercises. If you have trouble climbing stairs or getting up from the couch, do more functional exercises. You can also perform these exercises at one time or spread them during your day. Work with a phyiscal therapist to learn the right exercises for you.  ? ? ?

## 2021-09-19 NOTE — Progress Notes (Signed)
Patient ID: Heather Goodwin, female   DOB: September 27, 1950, 71 y.o.   MRN: [id number] ? ?This visit occurred during the SARS-CoV-2 public health emergency.  Safety protocols were in place, including screening questions prior to the visit, additional usage of staff PPE, and extensive cleaning of exam room while observing appropriate contact time as indicated for disinfecting solutions.  ? ?HPI  ?Heather Goodwin is a 71 y.o.-year-old female, referred by her PCP, Dr.Panosh, for management of osteoporosis (OP). ? ?Pt was dx with OP in 2020. ? ?I reviewed pt's DXA scans: ?Date L1-L4 T score FN T score 33% distal Radius Ultradistal radius  ?07/16/2021 -3.0 RFN: -1.4 ?LFN: -1.6 -3.1 -3.7  ?01/18/2019 -2.5 RFN: -1.0 ?LFN: -1.5 N/a N/a  ?09/26/2013 -2.1 RFN: n/a ?LFN: -1.4 N/a  N/a  ? ?Fractures: ?- Right foot in 2007 - from jumping on a trampoline in the gym ?- Left wrist 2018 - slipped - however, unclear if full fracture or just sprain ? ?No dizziness/vertigo/orthostasis/poor vision.  ? ?She was not on previous OP treatments.  She would not want to start any. ? ?No h/o vitamin D deficiency.  No available vit D levels for review. ?No results found for: VD25OH ? ?Pt is on: ?- calcium 600 mg 2 tabs a day  ?- vitamin D 2000 >> last 2 weeks: 4000 units ?- MVI ? ?+ some weight bearing exercises - uses weights.  She is walking/hiking/playing tennis. ? ?She does not take high vitamin A doses. ? ?Menopause was at 71 y/o.  ? ?FH of osteoporosis: mother, PGM. ? ?No h/o hyper/hypocalcemia or hyperparathyroidism. No h/o kidney stones. ?Lab Results  ?Component Value Date  ? CALCIUM 10.0 03/06/2021  ? CALCIUM 9.9 10/03/2019  ? CALCIUM 10.1 01/13/2018  ? CALCIUM 10.1 03/02/2011  ? CALCIUM 9.8 12/25/2009  ? CALCIUM 10.1 09/19/2007  ? ?No h/o thyrotoxicosis. Reviewed TSH recent levels:  ?Lab Results  ?Component Value Date  ? TSH 1.92 03/06/2021  ? TSH 1.85 10/03/2019  ? TSH 1.17 01/13/2018  ? TSH 1.16 03/02/2011  ? TSH 1.29 12/25/2009  ? ?No h/o  CKD. Last BUN/Cr: ?Lab Results  ?Component Value Date  ? BUN 13 03/06/2021  ? CREATININE 0.72 03/06/2021  ? ?SPEP negative: ?Component ?    Latest Ref Rng 04/01/2020  ?IgG (Immunoglobin G), Serum ?    586 - 1,602 mg/dL 929   ?IgA/Immunoglobulin A, Serum ?    87 - 352 mg/dL 269   ?IgM (Immunoglobulin M), Srm ?    26 - 217 mg/dL 120   ?Total Protein ?    6.0 - 8.5 g/dL 6.9   ?Albumin SerPl Elph-Mcnc ?    2.9 - 4.4 g/dL 4.1   ?Alpha 1 ?    0.0 - 0.4 g/dL 0.2   ?Alpha2 Glob SerPl Elph-Mcnc ?    0.4 - 1.0 g/dL 0.6   ?B-Globulin SerPl Elph-Mcnc ?    0.7 - 1.3 g/dL 1.0   ?Gamma Glob SerPl Elph-Mcnc ?    0.4 - 1.8 g/dL 1.0   ?M Protein SerPl Elph-Mcnc ?    Not Observed g/dL Not Observed   ?Globulin, Total ?    2.2 - 3.9 g/dL 2.8   ?Albumin/Glob SerPl ?    0.7 - 1.7  1.5   ?IFE 1 immunofixation pattern appears unremarkable. Evidence of  ?monoclonal protein is not apparent.  ?Please Note (HCV): Comment   ? ?She is on colestipol 1 g twice a day -used for stool incontinence/diarrhea when hiking.  She started to decrease the dose to once a day and would like to come off. ? ?ROS: ?Constitutional: no weight gain, no weight loss, no fatigue, no subjective hyperthermia, no subjective hypothermia, no nocturia ?Eyes: no blurry vision, no xerophthalmia ?ENT: no sore throat, no nodules palpated in neck, no dysphagia, no odynophagia, no hoarseness, + tinnitus, + hypoacusis ?Cardiovascular: no CP, no SOB, no palpitations, no leg swelling ?Respiratory: no cough, no SOB, no wheezing ?Gastrointestinal: no N, no V, no D, no C, no acid reflux ?Musculoskeletal: no muscle, no joint aches ?Skin: no rash, no hair loss ?Neurological: no tremors, no numbness or tingling/no dizziness/no HAs ?Psychiatric: no depression, no anxiety ?+ Low libido ? ?I reviewed pt's medications, allergies, PMH, social hx, family hx, and changes were documented in the history of present illness. Otherwise, unchanged from my initial visit note. ? ?Past Medical History:   ?Diagnosis Date  ? Beta thalassemia trait   ? Foot fracture   ? at gym  ? Peripheral neuropathy 04/01/2020  ? ?Past Surgical History:  ?Procedure Laterality Date  ? COLONOSCOPY  2006  ? normal -Dr.Brodie  ? COLONOSCOPY  2012  ? Dr.Brodie-hyperplastic polyps  ? left knee surgery  2009  ? meniscus   ? US ECHOCARDIOGRAPHY  2005  ? normal for syncope   ? ?Social History  ? ?Socioeconomic History  ? Marital status: Married  ?  Spouse name: Not on file  ? Number of children: 3  ? Years of education: Not on file  ? Highest education level: Not on file  ?Occupational History  ? Occupation: Real Estate  ? Occupation: Self-employed  ?Tobacco Use  ? Smoking status: Never  ? Smokeless tobacco: Never  ?Vaping Use  ? Vaping Use: Never used  ?Substance and Sexual Activity  ? Alcohol use: Yes  ?  Alcohol/week: 1.0 - 4.0 standard drink  ?  Types: 1 - 4 Glasses of wine per week  ?  Comment: occ  ? Drug use: Never  ? Sexual activity: Not on file  ?Other Topics Concern  ? Not on file  ?Social History Narrative  ? Lives at home with husband  ? 2-3 caffeine drinks daily  ? Patient is right handed.  ?   ?   ? ?Social Determinants of Health  ? ?Financial Resource Strain: Not on file  ?Food Insecurity: Not on file  ?Transportation Needs: Not on file  ?Physical Activity: Not on file  ?Stress: Not on file  ?Social Connections: Not on file  ?Intimate Partner Violence: Not on file  ? ?Current Outpatient Medications on File Prior to Visit  ?Medication Sig Dispense Refill  ? CALCIUM PO Take by mouth.    ? Cholecalciferol (VITAMIN D3 PO) Take by mouth.    ? colestipol (COLESTID) 1 g tablet TAKE 1 TABLET BY MOUTH TWICE A DAY 180 tablet 1  ? Multiple Vitamin (MULTIVITAMIN) tablet Take 1 tablet by mouth daily.    ? Na Sulfate-K Sulfate-Mg Sulf 17.5-3.13-1.6 GM/177ML SOLN Take 1 kit by mouth as directed. May use generic SUPREP; NO prior authorizations will be done. Use singlecare 354 mL 0  ? Wheat Dextrin (BENEFIBER DRINK MIX PO) Take by mouth in the  morning and at bedtime. 3 teaspoons two times  daily    ? ?No current facility-administered medications on file prior to visit.  ? ?Allergies  ?Allergen Reactions  ? No Known Allergies   ? ?Family History  ?Problem Relation Age of Onset  ? Thyroid disease Mother   ?  partial thyroidectomy  ? Pancreatic cancer Mother 32  ? Cirrhosis Father   ?     secondary to chronic hepatitis b infection  ? Other Son   ?     B Thal trait  ? Breast cancer Paternal Aunt 23  ? Breast cancer Maternal Aunt 80  ? Pancreatic cancer Maternal Grandfather   ?     dx 75-80  ? Melanoma Brother 37  ? Colon cancer Neg Hx   ? Esophageal cancer Neg Hx   ? Rectal cancer Neg Hx   ? ?PE: ?BP 110/76 (BP Location: Right Arm, Patient Position: Sitting, Cuff Size: Normal)   Pulse 71   Ht _0  (1.702 m)   Wt 149 lb 9.6 oz (67.9 kg)   SpO2 99%   BMI 23.43 kg/m?  ?Wt Readings from Last 3 Encounters:  ?09/19/21 149 lb 9.6 oz (67.9 kg)  ?09/10/21 146 lb (66.2 kg)  ?07/29/21 146 lb (66.2 kg)  ? ?Constitutional: Normal weight, in NAD. No kyphosis. ?Eyes: PERRLA, EOMI, no exophthalmos ?ENT: moist mucous membranes, no thyromegaly, no cervical lymphadenopathy ?Cardiovascular: RRR, No MRG ?Respiratory: CTA B ?Gastrointestinal: abdomen soft, NT, ND, BS+ ?Musculoskeletal: no deformities, strength intact in all 4, no pain at percussion of the spine ?Skin: moist, warm, no rashes ?Neurological: no tremor with outstretched hands, DTR normal in all 4 ? ?Assessment: ?1. Osteoporosis ? ?Plan: ?1. Osteoporosis ?- likely postmenopausal/age-related, she also has FH of OP ?-There is no clear history of fragility fracture.  She had a foot fracture in the past, however, this is not considered a fragility fracture.  She also had a reported distal L radial fracture but this was not obvious on recent left forearm imaging (for DXA) but it was unclear whether this was a real fracture or just a sprain. ?- At this visit, we reviewed her latest 3 DXA scan reports together.  We  discussed about increased risk of fracture, depending on the T score, greatly increased when the T score is lower than -2.5, in the osteoporosis range. Based on the T scores, she has an increased risk for fractu

## 2021-09-20 LAB — PTH, INTACT AND CALCIUM
Calcium: 10 mg/dL (ref 8.7–10.3)
PTH: 22 pg/mL (ref 15–65)

## 2021-09-29 ENCOUNTER — Telehealth: Payer: Self-pay | Admitting: Internal Medicine

## 2021-09-29 MED ORDER — BENZONATATE 100 MG PO CAPS: 100.0000 mg | ORAL_CAPSULE | Freq: Three times a day (TID) | ORAL | 1 refills | Status: DC | PRN

## 2021-09-29 NOTE — Telephone Encounter (Signed)
Co bad cough congestion no fever   but feels tired  for the past 4-5 days    ?Home rapid covid tests negative today . ?Asks fo r help with cough .  ? ?No sob  ?Cough sounds bronchial  no dyspnea.  ?Will send in  tessalon perles . To [address] [facility] [address] ?Sx rx  advised .. ? ? ?

## 2021-09-30 ENCOUNTER — Encounter: Payer: Self-pay | Admitting: Gastroenterology

## 2021-10-04 ENCOUNTER — Encounter: Payer: Self-pay | Admitting: Gastroenterology

## 2021-10-09 ENCOUNTER — Encounter: Payer: Self-pay | Admitting: Gastroenterology

## 2021-10-09 ENCOUNTER — Ambulatory Visit (AMBULATORY_SURGERY_CENTER): Payer: Medicare HMO | Admitting: Gastroenterology

## 2021-10-09 VITALS — BP 108/76 | HR 67 | Temp 96.4°F | Resp 17 | Ht 67.0 in | Wt 146.0 lb

## 2021-10-09 DIAGNOSIS — D122 Benign neoplasm of ascending colon: Secondary | ICD-10-CM | POA: Diagnosis not present

## 2021-10-09 DIAGNOSIS — Z1211 Encounter for screening for malignant neoplasm of colon: Secondary | ICD-10-CM | POA: Diagnosis not present

## 2021-10-09 DIAGNOSIS — D125 Benign neoplasm of sigmoid colon: Secondary | ICD-10-CM

## 2021-10-09 MED ORDER — SODIUM CHLORIDE 0.9 % IV SOLN: 500.0000 mL | Freq: Once | INTRAVENOUS | Status: DC

## 2021-10-09 NOTE — Progress Notes (Signed)
Pt non-responsive, VVS, Report to RN  °

## 2021-10-09 NOTE — Progress Notes (Signed)
Pt's states no medical or surgical changes since previsit or office visit. VS assessed by [facility].C ?

## 2021-10-09 NOTE — Op Note (Signed)
[place] ?Patient Name: Heather Goodwin ?Procedure Date: 10/09/2021 8:12 AM ?MRN: [id number] ?Endoscopist: Mauri Pole , MD ?Age: 71 ?Referring MD:  ?Date of Birth: [birth year] ?Gender: Female ?Account #: [id number] ?Procedure:                Colonoscopy ?Indications:              Screening for colorectal malignant neoplasm ?Medicines:                Monitored Anesthesia Care ?Procedure:                Pre-Anesthesia Assessment: ?                          - Prior to the procedure, a History and Physical  ?                          was performed, and patient medications and  ?                          allergies were reviewed. The patient's tolerance of  ?                          previous anesthesia was also reviewed. The risks  ?                          and benefits of the procedure and the sedation  ?                          options and risks were discussed with the patient.  ?                          All questions were answered, and informed consent  ?                          was obtained. Prior Anticoagulants: The patient has  ?                          taken no previous anticoagulant or antiplatelet  ?                          agents. ASA Grade Assessment: II - A patient with  ?                          mild systemic disease. After reviewing the risks  ?                          and benefits, the patient was deemed in  ?                          satisfactory condition to undergo the procedure. ?                          After obtaining informed consent, the colonoscope  ?  was passed under direct vision. Throughout the  ?                          procedure, the patient's blood pressure, pulse, and  ?                          oxygen saturations were monitored continuously. The  ?                          Olympus PCF-[id number] (#[id number]) Colonoscope was  ?                          introduced through the anus and advanced to the the  ?                          terminal  ileum, with identification of the  ?                          appendiceal orifice and IC valve. The colonoscopy  ?                          was performed without difficulty. The patient  ?                          tolerated the procedure well. The quality of the  ?                          bowel preparation was good. The terminal ileum,  ?                          ileocecal valve, appendiceal orifice, and rectum  ?                          were photographed. ?Scope In: 8:16:28 AM ?Scope Out: 8:35:29 AM ?Scope Withdrawal Time: 0 hours 15 minutes 52 seconds  ?Total Procedure Duration: 0 hours 19 minutes 1 second  ?Findings:                 The digital rectal exam findings include decreased  ?                          sphincter tone. ?                          Three sessile polyps were found in the sigmoid  ?                          colon and ascending colon. The polyps were 3 to 10  ?                          mm in size. These polyps were removed with a cold  ?                          snare. Resection and retrieval were complete. ?  A single small angioectasia with typical  ?                          arborization was found in the transverse colon. ?                          A few small-mouthed diverticula were found in the  ?                          sigmoid colon. ?                          Non-bleeding external and internal hemorrhoids were  ?                          found during retroflexion. The hemorrhoids were  ?                          medium-sized. ?                          The exam was otherwise without abnormality. ?Complications:            No immediate complications. ?Estimated Blood Loss:     Estimated blood loss was minimal. ?Impression:               - Decreased sphincter tone found on digital rectal  ?                          exam. ?                          - Three 3 to 10 mm polyps in the sigmoid colon and  ?                          in the ascending colon, removed with  a cold snare.  ?                          Resected and retrieved. ?                          - A single colonic angioectasia. ?                          - Diverticulosis in the sigmoid colon. ?                          - Non-bleeding external and internal hemorrhoids. ?                          - The examination was otherwise normal. ?Recommendation:           - Patient has a contact number available for  ?                          emergencies. The signs and symptoms of potential  ?  delayed complications were discussed with the  ?                          patient. Return to normal activities tomorrow.  ?                          Written discharge instructions were provided to the  ?                          patient. ?                          - Resume previous diet. ?                          - Continue present medications. ?                          - No high dose aspirin, ibuprofen, naproxen, or  ?                          other non-steroidal anti-inflammatory drugs for 2  ?                          weeks. ?                          - Await pathology results. ?                          - Repeat colonoscopy in 3-10 years for surveillance  ?                          based on pathology results. ?                          - Return to GI clinic in 3 months for follow up  ?                          visit. ?Mauri Pole, MD ?10/09/2021 8:46:05 AM ?This report has been signed electronically. ?

## 2021-10-09 NOTE — Progress Notes (Signed)
[facility] History and Physical ? ? ?Primary Care Physician:  Burnis Medin, MD ? ? ?Reason for Procedure:  Colorectal cancer screening ? ?Plan:    Screening colonoscopy with possible interventions as needed ? ? ? ? ?HPI: Heather Goodwin is a very pleasant 71 y.o. female here for screening colonoscopy. ?Denies any nausea, vomiting, abdominal pain, melena or bright red blood per rectum ? ?The risks and benefits as well as alternatives of endoscopic procedure(s) have been discussed and reviewed. All questions answered. The patient agrees to proceed. ? ? ? ?Past Medical History:  ?Diagnosis Date  ? Beta thalassemia trait   ? Cataract Early stage  ? Foot fracture   ? at gym  ? Peripheral neuropathy 04/01/2020  ? ? ?Past Surgical History:  ?Procedure Laterality Date  ? COLONOSCOPY  2006  ? normal -Dr.Brodie  ? COLONOSCOPY  2012  ? Dr.Brodie-hyperplastic polyps  ? FRACTURE SURGERY  Meniscus repair  ? left knee surgery  2009  ? meniscus   ? US ECHOCARDIOGRAPHY  2005  ? normal for syncope   ? ? ?Prior to Admission medications   ?Medication Sig Start Date End Date Taking? Authorizing Provider  ?benzonatate (TESSALON PERLES) 100 MG capsule Take 1-2 capsules (100-200 mg total) by mouth 3 (three) times daily as needed for cough. Maximum 600 mg per day 09/29/21  Yes Panosh, Standley Brooking, MD  ?CALCIUM PO Take by mouth.   Yes [provider]  ?Cholecalciferol (VITAMIN D3 PO) Take by mouth.   Yes [provider]  ?colestipol (COLESTID) 1 g tablet TAKE 1 TABLET BY MOUTH TWICE A DAY 08/08/21  Yes Kipp Shank, Venia Minks, MD  ?Multiple Vitamin (MULTIVITAMIN) tablet Take 1 tablet by mouth daily.   Yes [provider]  ?Wheat Dextrin (BENEFIBER DRINK MIX PO) Take by mouth in the morning and at bedtime. 3 teaspoons two times  daily   Yes [provider]  ? ? ?Current Outpatient Medications  ?Medication Sig Dispense Refill  ? benzonatate (TESSALON PERLES) 100 MG capsule Take 1-2 capsules (100-200  mg total) by mouth 3 (three) times daily as needed for cough. Maximum 600 mg per day 24 capsule 1  ? CALCIUM PO Take by mouth.    ? Cholecalciferol (VITAMIN D3 PO) Take by mouth.    ? colestipol (COLESTID) 1 g tablet TAKE 1 TABLET BY MOUTH TWICE A DAY 180 tablet 1  ? Multiple Vitamin (MULTIVITAMIN) tablet Take 1 tablet by mouth daily.    ? Wheat Dextrin (BENEFIBER DRINK MIX PO) Take by mouth in the morning and at bedtime. 3 teaspoons two times  daily    ? ?Current Facility-Administered Medications  ?Medication Dose Route Frequency Provider Last Rate Last Admin  ? 0.9 %  sodium chloride infusion  500 mL Intravenous Once Greysin Medlen, Venia Minks, MD      ? ? ?Allergies as of 10/09/2021  ? (No Known Allergies)  ? ? ?Family History  ?Problem Relation Age of Onset  ? Thyroid disease Mother   ?     partial thyroidectomy  ? Pancreatic cancer Mother 38  ? Cirrhosis Father   ?     secondary to chronic hepatitis b infection  ? Other Son   ?     B Thal trait  ? Breast cancer Paternal Aunt 57  ? Breast cancer Maternal Aunt 80  ? Pancreatic cancer Maternal Grandfather   ?     dx 75-80  ? Melanoma Brother 52  ? Colon cancer Neg  Hx   ? Esophageal cancer Neg Hx   ? Rectal cancer Neg Hx   ? ? ?Social History  ? ?Socioeconomic History  ? Marital status: Married  ?  Spouse name: Not on file  ? Number of children: 3  ? Years of education: Not on file  ? Highest education level: Not on file  ?Occupational History  ? Occupation: Real Estate  ? Occupation: Self-employed  ?Tobacco Use  ? Smoking status: Never  ? Smokeless tobacco: Never  ?Vaping Use  ? Vaping Use: Never used  ?Substance and Sexual Activity  ? Alcohol use: Yes  ?  Alcohol/week: 1.0 - 4.0 standard drink  ?  Types: 1 - 4 Glasses of wine per week  ?  Comment: occ  ? Drug use: Never  ? Sexual activity: Not on file  ?Other Topics Concern  ? Not on file  ?Social History Narrative  ? Lives at home with husband  ? 2-3 caffeine drinks daily  ? Patient is right handed.  ?   ?   ? ?Social  Determinants of Health  ? ?Financial Resource Strain: Not on file  ?Food Insecurity: Not on file  ?Transportation Needs: Not on file  ?Physical Activity: Not on file  ?Stress: Not on file  ?Social Connections: Not on file  ?Intimate Partner Violence: Not on file  ? ? ?Review of Systems: ? ?All other review of systems negative except as mentioned in the HPI. ? ?Physical Exam: ?Vital signs in last 24 hours: ?BP 133/82   Pulse 85   Temp (!) 96.4 ?F (35.8 ?C) (Skin)   Ht '5\' 7"'$  (1.702 m)   Wt 146 lb (66.2 kg)   SpO2 97%   BMI 22.87 kg/m?  ?General:   Alert, NAD ?Lungs:  Clear .   ?Heart:  Regular rate and rhythm ?Abdomen:  Soft, nontender and nondistended. ?Neuro/Psych:  Alert and cooperative. Normal mood and affect. A and O x 3 ? ?Reviewed labs, radiology imaging, old records and pertinent past GI work up ? ?Patient is appropriate for planned procedure(s) and anesthesia in an ambulatory setting ? ? ?K. Denzil Magnuson , MD ?[phone]  ? ? ?  ?

## 2021-10-09 NOTE — Patient Instructions (Signed)
Read all of the handouts given to you by your recovery room nurse.  No NSAIDS for 2 weeks per Dr. Silverio Decamp. ? ?YOU HAD AN ENDOSCOPIC PROCEDURE TODAY AT THE [facility]:   Refer to the procedure report that was given to you for any specific questions about what was found during the examination.  If the procedure report does not answer your questions, please call your gastroenterologist to clarify.  If you requested that your care partner not be given the details of your procedure findings, then the procedure report has been included in a sealed envelope for you to review at your convenience later. ? ?YOU SHOULD EXPECT: Some feelings of bloating in the abdomen. Passage of more gas than usual.  Walking can help get rid of the air that was put into your GI tract during the procedure and reduce the bloating. If you had a lower endoscopy (such as a colonoscopy or flexible sigmoidoscopy) you may notice spotting of blood in your stool or on the toilet paper. If you underwent a bowel prep for your procedure, you may not have a normal bowel movement for a few days. ? ?Please Note:  You might notice some irritation and congestion in your nose or some drainage.  This is from the oxygen used during your procedure.  There is no need for concern and it should clear up in a day or so. ? ?SYMPTOMS TO REPORT IMMEDIATELY: ? ?Following lower endoscopy (colonoscopy or flexible sigmoidoscopy): ? Excessive amounts of blood in the stool ? Significant tenderness or worsening of abdominal pains ? Swelling of the abdomen that is new, acute ? Fever of 100?F or higher ? ? ?For urgent or emergent issues, a gastroenterologist can be reached at any hour by calling [phone]. ?Do not use MyChart messaging for urgent concerns.  ? ? ?DIET:  We do recommend a small meal at first, but then you may proceed to your regular diet.  Drink plenty of fluids but you should avoid alcoholic beverages for 24 hours. ?Try to increase the fiber in  your diet, and drink plenty of water. ? ?ACTIVITY:  You should plan to take it easy for the rest of today and you should NOT DRIVE or use heavy machinery until tomorrow (because of the sedation medicines used during the test).   ? ?FOLLOW UP: ?Our staff will call the number listed on your records 48-72 hours following your procedure to check on you and address any questions or concerns that you may have regarding the information given to you following your procedure. If we do not reach you, we will leave a message.  We will attempt to reach you two times.  During this call, we will ask if you have developed any symptoms of COVID 19. If you develop any symptoms (ie: fever, flu-like symptoms, shortness of breath, cough etc.) before then, please call [phone].  If you test positive for Covid 19 in the 2 weeks post procedure, please call and report this information to [place].   ? ?If any biopsies were taken you will be contacted by phone or by letter within the next 1-3 weeks.  Please call us at [phone] if you have not heard about the biopsies in 3 weeks.  ? ? ?SIGNATURES/CONFIDENTIALITY: ?You and/or your care partner have signed paperwork which will be entered into your electronic medical record.  These signatures attest to the fact that that the information above on your After Visit Summary has been reviewed and is  understood.  Full responsibility of the confidentiality of this discharge information lies with you and/or your care-partner.  ?

## 2021-10-09 NOTE — Progress Notes (Signed)
Called to room to assist during endoscopic procedure.  Patient ID and intended procedure confirmed with present staff. Received instructions for my participation in the procedure from the performing physician.  

## 2021-10-10 ENCOUNTER — Ambulatory Visit: Payer: Medicare HMO | Admitting: Gastroenterology

## 2021-10-13 ENCOUNTER — Telehealth: Payer: Self-pay | Admitting: *Deleted

## 2021-10-13 NOTE — Telephone Encounter (Signed)
?  Follow up Call- ? ? ?  10/09/2021  ?  7:15 AM  ?Call back number  ?Post procedure Call Back phone  # [phone]  ?Permission to leave phone message Yes  ?  ? ?Patient questions: ? ?Do you have a fever, pain , or abdominal swelling? No. ?Pain Score  0 * ? ?Have you tolerated food without any problems? Yes.   ? ?Have you been able to return to your normal activities? Yes.   ? ?Do you have any questions about your discharge instructions: ?Diet   No. ?Medications  No. ?Follow up visit  No. ? ?Do you have questions or concerns about your Care? No. ? ?Actions: ?* If pain score is 4 or above: ?No action needed, pain <4. ? ? ?

## 2021-10-13 NOTE — Telephone Encounter (Signed)
?  Follow up Call- ? ? ?  10/09/2021  ?  7:15 AM  ?Call back number  ?Post procedure Call Back phone  # [phone]  ?Permission to leave phone message Yes  ?  ?First attempt for follow up phone call. No answer at number given.  Left message on voicemail.   ?

## 2021-10-17 ENCOUNTER — Encounter: Payer: Self-pay | Admitting: Gastroenterology

## 2021-10-17 DIAGNOSIS — R69 Illness, unspecified: Secondary | ICD-10-CM | POA: Diagnosis not present

## 2021-10-17 DIAGNOSIS — F4322 Adjustment disorder with anxiety: Secondary | ICD-10-CM | POA: Diagnosis not present

## 2021-10-23 ENCOUNTER — Other Ambulatory Visit: Payer: Self-pay

## 2021-10-28 DIAGNOSIS — R69 Illness, unspecified: Secondary | ICD-10-CM | POA: Diagnosis not present

## 2021-10-28 DIAGNOSIS — H2513 Age-related nuclear cataract, bilateral: Secondary | ICD-10-CM | POA: Diagnosis not present

## 2021-10-28 DIAGNOSIS — H5203 Hypermetropia, bilateral: Secondary | ICD-10-CM | POA: Diagnosis not present

## 2021-11-09 ENCOUNTER — Encounter: Payer: Self-pay | Admitting: Internal Medicine

## 2021-11-10 ENCOUNTER — Other Ambulatory Visit: Payer: Self-pay

## 2021-11-10 DIAGNOSIS — M810 Age-related osteoporosis without current pathological fracture: Secondary | ICD-10-CM

## 2021-11-10 NOTE — Telephone Encounter (Signed)
Please do a PA for referral to [facility]  as advised by endocrinology    has dx osteoporosis .

## 2021-11-10 NOTE — Telephone Encounter (Signed)
Referral sent 

## 2021-11-19 ENCOUNTER — Encounter: Payer: Self-pay | Admitting: Internal Medicine

## 2021-12-04 ENCOUNTER — Telehealth: Payer: Self-pay | Admitting: Internal Medicine

## 2021-12-04 NOTE — Telephone Encounter (Signed)
Patient still hasn't received any info on PA for [facility] referral. Please look into this and contact patient.

## 2021-12-15 ENCOUNTER — Encounter: Payer: Self-pay | Admitting: Gastroenterology

## 2021-12-15 ENCOUNTER — Ambulatory Visit: Payer: Medicare HMO | Admitting: Gastroenterology

## 2021-12-15 VITALS — BP 102/64 | HR 74 | Ht 67.0 in | Wt 148.0 lb

## 2021-12-15 DIAGNOSIS — R1084 Generalized abdominal pain: Secondary | ICD-10-CM | POA: Diagnosis not present

## 2021-12-15 DIAGNOSIS — Z800 Family history of malignant neoplasm of digestive organs: Secondary | ICD-10-CM

## 2021-12-15 DIAGNOSIS — R203 Hyperesthesia: Secondary | ICD-10-CM | POA: Diagnosis not present

## 2021-12-15 DIAGNOSIS — R159 Full incontinence of feces: Secondary | ICD-10-CM

## 2021-12-15 DIAGNOSIS — K580 Irritable bowel syndrome with diarrhea: Secondary | ICD-10-CM | POA: Diagnosis not present

## 2021-12-15 DIAGNOSIS — L814 Other melanin hyperpigmentation: Secondary | ICD-10-CM | POA: Diagnosis not present

## 2021-12-15 DIAGNOSIS — I872 Venous insufficiency (chronic) (peripheral): Secondary | ICD-10-CM | POA: Diagnosis not present

## 2021-12-15 DIAGNOSIS — L538 Other specified erythematous conditions: Secondary | ICD-10-CM | POA: Diagnosis not present

## 2021-12-15 NOTE — Progress Notes (Signed)
Heather Goodwin    [id number]    1950-07-19  Primary Care Physician:Panosh, Standley Brooking, MD  Referring Physician: Burnis Medin, MD [place],  [place] [id number]    Chief complaint:  Fecal urgency  HPI:  71 year old very pleasant female here for follow-up visit for fecal urgency, incontinence and IBS-diarrhea.   She is having intermittent episodes of fecal urgency and incontinence.  She has incontinence mostly when she goes on long hikes on trails or when she is exercising.     Overall bowel habits have improved but she continues to have irregularity with incomplete evacuation.   She is taking  Benefiber but discontinued Colestid, feels its causing fatigue and loss of muscle mass.  Most days she has semiformed bowel movements   Denies any nausea, vomiting, abdominal pain, melena or bright red blood per rectum  Colonoscopy October 09, 2021 - Decreased sphincter tone found on digital rectal exam. - Three 3 to 10 mm polyps in the sigmoid colon and in the ascending colon, removed with a cold snare. Resected and retrieved. - A single colonic angioectasia. - Diverticulosis in the sigmoid colon.   Colonoscopy in 2006: Normal  Colonoscopy 2012 with hyperplastic rectal polyp and internal hemorrhoids otherwise normal exam   Outpatient Encounter Medications as of 12/15/2021  Medication Sig   CALCIUM PO Take by mouth.   Cholecalciferol (VITAMIN D3 PO) Take by mouth.   Multiple Vitamin (MULTIVITAMIN) tablet Take 1 tablet by mouth daily.   Wheat Dextrin (BENEFIBER DRINK MIX PO) Take by mouth in the morning and at bedtime. 3 teaspoons two times  daily   [DISCONTINUED] benzonatate (TESSALON PERLES) 100 MG capsule Take 1-2 capsules (100-200 mg total) by mouth 3 (three) times daily as needed for cough. Maximum 600 mg per day   [DISCONTINUED] colestipol (COLESTID) 1 g tablet TAKE 1 TABLET BY MOUTH TWICE A DAY   No facility-administered encounter medications on  file as of 12/15/2021.    Allergies as of 12/15/2021   (No Known Allergies)    Past Medical History:  Diagnosis Date   Beta thalassemia trait    Cataract Early stage   Foot fracture    at gym   Osteoporosis    Peripheral neuropathy 04/01/2020    Past Surgical History:  Procedure Laterality Date   COLONOSCOPY  2006   normal -Dr.Brodie   COLONOSCOPY  2012   Dr.Brodie-hyperplastic polyps   FRACTURE SURGERY  Meniscus repair   left knee surgery  2009   meniscus    US ECHOCARDIOGRAPHY  2005   normal for syncope     Family History  Problem Relation Age of Onset   Thyroid disease Mother        partial thyroidectomy   Pancreatic cancer Mother 66   Cirrhosis Father        secondary to chronic hepatitis b infection   Other Son        B Thal trait   Breast cancer Paternal Aunt 78   Breast cancer Maternal Aunt 80   Pancreatic cancer Maternal Grandfather        dx 75-80   Melanoma Brother 12   Colon cancer Neg Hx    Esophageal cancer Neg Hx    Rectal cancer Neg Hx     Social History   Socioeconomic History   Marital status: Married    Spouse name: Not on file   Number of children: 3  Years of education: Not on file   Highest education level: Not on file  Occupational History   Occupation: Real Estate   Occupation: Self-employed  Tobacco Use   Smoking status: Never   Smokeless tobacco: Never  Vaping Use   Vaping Use: Never used  Substance and Sexual Activity   Alcohol use: Yes    Alcohol/week: 1.0 - 4.0 standard drink of alcohol    Types: 1 - 4 Glasses of wine per week    Comment: occ   Drug use: Never   Sexual activity: Not on file  Other Topics Concern   Not on file  Social History Narrative   Lives at home with husband   2-3 caffeine drinks daily   Patient is right handed.         Social Determinants of Health   Financial Resource Strain: Not on file  Food Insecurity: Not on file  Transportation Needs: Not on file  Physical Activity: Not on  file  Stress: Not on file  Social Connections: Not on file  Intimate Partner Violence: Not on file      Review of systems: All other review of systems negative except as mentioned in the HPI.   Physical Exam: Vitals:   12/15/21 0855  BP: 102/64  Pulse: 74   Body mass index is 23.18 kg/m. 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  She is hesitant to continue with it because she feels it is exacerbating muscle weakness and fatigue. Switch to WelChol 625 mg 3 times daily as needed.  Advised patient to avoid taking it within 2 hours of meals or other medications to prevent drug interaction  Has had extensive GI work-up in the past, fecal elastase, fecal fat and lactoferrin within normal range Colon biopsies negative for microscopic colitis  Her mom had pancreatic cancer and her aunt has pancreatic insufficiency, she is worried about pancreatic cancer.  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 The patient agreed with the plan and demonstrated an understanding of the instructions.  [name] , MD    CC: Panosh, Standley Brooking, MD

## 2021-12-17 ENCOUNTER — Encounter: Payer: Self-pay | Admitting: Gastroenterology

## 2022-01-06 ENCOUNTER — Ambulatory Visit: Payer: Self-pay | Admitting: Plastic Surgery

## 2022-01-06 ENCOUNTER — Encounter: Payer: Self-pay | Admitting: Plastic Surgery

## 2022-01-06 DIAGNOSIS — Z719 Counseling, unspecified: Secondary | ICD-10-CM

## 2022-01-06 NOTE — Progress Notes (Signed)
     Patient ID: Heather Goodwin, female    DOB: 1950/09/28, 71 y.o.   MRN: [id number]   Chief Complaint  Patient presents with   Consult    Is a 71 year old female here for evaluation of her face.  In December 2021 she was on a cruise ship and had some filler placed in her face.  She thinks it might have been Restylane.  She had some bumps that appeared within a year.  By May 2023 she had solution infused that helped the bumps go away.  She would like to see what she could do about facial rejuvenation.  She is asked about fat grafting possible filler pill.  She is a Fitzpatrick 3-4 and has very nice complexion without much redness or pigment discoloration.  She has nice cheekbones but loss of volume below that.  She has had a blepharoplasty.    Review of Systems  Constitutional: Negative.   HENT: Negative.    Eyes: Negative.   Respiratory: Negative.    Cardiovascular: Negative.   Gastrointestinal: Negative.   Endocrine: Negative.   Genitourinary: Negative.   Musculoskeletal: Negative.     Past Medical History:  Diagnosis Date   Beta thalassemia trait    Cataract Early stage   Foot fracture    at gym   Osteoporosis    Peripheral neuropathy 04/01/2020    Past Surgical History:  Procedure Laterality Date   COLONOSCOPY  2006   normal -Dr.Brodie   COLONOSCOPY  2012   Dr.Brodie-hyperplastic polyps   FRACTURE SURGERY  Meniscus repair   left knee surgery  2009   meniscus    US ECHOCARDIOGRAPHY  2005   normal for syncope       Current Outpatient Medications:    CALCIUM PO, Take by mouth., Disp: , Rfl:    Cholecalciferol (VITAMIN D3 PO), Take by mouth., Disp: , Rfl:    Multiple Vitamin (MULTIVITAMIN) tablet, Take 1 tablet by mouth daily., Disp: , Rfl:    Wheat Dextrin (BENEFIBER DRINK MIX PO), Take by mouth in the morning and at bedtime. 3 teaspoons two times  daily, Disp: , Rfl:    Objective:   Vitals:    Physical Exam Constitutional:      Appearance: Normal  appearance.  Skin:    Coloration: Skin is not jaundiced.     Findings: No bruising or lesion.  Neurological:     Mental Status: She is alert and oriented to person, place, and time.  Psychiatric:        Mood and Affect: Mood normal.        Behavior: Behavior normal.        Thought Content: Thought content normal.     Assessment & Plan:  Encounter for counseling  I think the safest thing for the patient would be the halo for collagen stimulation.  I would not want to jump to filler or fat grafting since she had that recent experience just 2 months ago.  I will send the patient the halo information in her chart and she will think it over.  Pictures not taken as patient did not want them placed in her chart. [place], DO

## 2022-01-21 ENCOUNTER — Other Ambulatory Visit (INDEPENDENT_AMBULATORY_CARE_PROVIDER_SITE_OTHER): Payer: Medicare HMO

## 2022-01-21 DIAGNOSIS — K580 Irritable bowel syndrome with diarrhea: Secondary | ICD-10-CM | POA: Diagnosis not present

## 2022-01-21 LAB — BASIC METABOLIC PANEL
BUN: 16 mg/dL (ref 6–23)
CO2: 29 mEq/L (ref 19–32)
Calcium: 9.7 mg/dL (ref 8.4–10.5)
Chloride: 102 mEq/L (ref 96–112)
Creatinine, Ser: 0.78 mg/dL (ref 0.40–1.20)
GFR: 77.82 mL/min (ref >60.00)
Glucose, Bld: 93 mg/dL (ref 70–99)
Potassium: 3.9 mEq/L (ref 3.5–5.1)
Sodium: 137 mEq/L (ref 135–145)

## 2022-01-26 ENCOUNTER — Telehealth: Payer: Self-pay | Admitting: Gastroenterology

## 2022-01-26 ENCOUNTER — Encounter: Payer: Self-pay | Admitting: Gastroenterology

## 2022-01-26 NOTE — Telephone Encounter (Signed)
Patient called requesting a medication she can take during her MRI to be called in to her pharmacy.Please call to advise

## 2022-01-26 NOTE — Telephone Encounter (Signed)
Patient returned your call.

## 2022-01-27 ENCOUNTER — Other Ambulatory Visit: Payer: Self-pay

## 2022-01-27 ENCOUNTER — Ambulatory Visit: Payer: Medicare HMO | Attending: Gastroenterology | Admitting: Physical Therapy

## 2022-01-27 DIAGNOSIS — R293 Abnormal posture: Secondary | ICD-10-CM | POA: Insufficient documentation

## 2022-01-27 DIAGNOSIS — M6281 Muscle weakness (generalized): Secondary | ICD-10-CM | POA: Diagnosis not present

## 2022-01-27 DIAGNOSIS — R279 Unspecified lack of coordination: Secondary | ICD-10-CM | POA: Diagnosis not present

## 2022-01-27 NOTE — Telephone Encounter (Signed)
Patient returned your call. Please call to advise.

## 2022-01-27 NOTE — Telephone Encounter (Signed)
Lets do Xanax 0.5 mg PO 1 hr before If she still has problems.  Can take 0.5 mg just before Please call in 2 tablets Has to bring somebody along to drive her, if she decides to take it RG

## 2022-01-27 NOTE — Telephone Encounter (Signed)
Dr Lyndel Safe, You are Doc of the Day and Dr Silverio Decamp is on vacation.  This patient is having a MRI on Friday and wants something to take 30 minutes to an hour before to calm her down. Do you approve to send her in something just so she can have the MRI? Thanks
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
## 2022-01-30 ENCOUNTER — Other Ambulatory Visit: Payer: Self-pay | Admitting: Gastroenterology

## 2022-01-30 ENCOUNTER — Ambulatory Visit (HOSPITAL_COMMUNITY): Admission: RE | Admit: 2022-01-30 | Payer: Medicare HMO | Source: Ambulatory Visit

## 2022-01-30 DIAGNOSIS — R1084 Generalized abdominal pain: Secondary | ICD-10-CM

## 2022-01-30 DIAGNOSIS — Z800 Family history of malignant neoplasm of digestive organs: Secondary | ICD-10-CM

## 2022-01-30 NOTE — Telephone Encounter (Signed)
Spoke with patient and she will decide if she wants to take the Xanax or not. But she wanted me to still call it in. I had to leave a voicemail at [facility] they were closed for lunch

## 2022-02-01 ENCOUNTER — Ambulatory Visit (HOSPITAL_COMMUNITY)
Admission: RE | Admit: 2022-02-01 | Discharge: 2022-02-01 | Disposition: A | Discharge status: Home / Self Care | Payer: Medicare HMO | Source: Ambulatory Visit | Attending: Gastroenterology | Admitting: Gastroenterology

## 2022-02-01 DIAGNOSIS — D1803 Hemangioma of intra-abdominal structures: Secondary | ICD-10-CM | POA: Insufficient documentation

## 2022-02-01 DIAGNOSIS — R160 Hepatomegaly, not elsewhere classified: Secondary | ICD-10-CM | POA: Diagnosis not present

## 2022-02-01 DIAGNOSIS — Z800 Family history of malignant neoplasm of digestive organs: Secondary | ICD-10-CM | POA: Insufficient documentation

## 2022-02-01 DIAGNOSIS — R1084 Generalized abdominal pain: Secondary | ICD-10-CM | POA: Diagnosis not present

## 2022-02-01 DIAGNOSIS — K769 Liver disease, unspecified: Secondary | ICD-10-CM | POA: Diagnosis not present

## 2022-02-01 DIAGNOSIS — R935 Abnormal findings on diagnostic imaging of other abdominal regions, including retroperitoneum: Secondary | ICD-10-CM | POA: Diagnosis not present

## 2022-02-01 MED ORDER — GADOBUTROL 1 MMOL/ML IV SOLN
7.0000 mL | Freq: Once | INTRAVENOUS | Status: AC | PRN
  Administered 2022-02-01: 7 mL via INTRAVENOUS

## 2022-03-19 ENCOUNTER — Ambulatory Visit: Payer: Medicare HMO | Attending: Gastroenterology | Admitting: Physical Therapy

## 2022-03-19 DIAGNOSIS — R293 Abnormal posture: Secondary | ICD-10-CM | POA: Diagnosis not present

## 2022-03-19 DIAGNOSIS — R279 Unspecified lack of coordination: Secondary | ICD-10-CM | POA: Diagnosis not present

## 2022-03-19 DIAGNOSIS — M6281 Muscle weakness (generalized): Secondary | ICD-10-CM | POA: Insufficient documentation
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
## 2022-03-31 NOTE — Progress Notes (Unsigned)
No chief complaint on file.   HPI: Patient  Heather Goodwin  71 y.o. comes in today for Preventive Health Care visit   Health Maintenance  Topic Date Due   Hepatitis C Screening  Never done   Pneumonia Vaccine 67+ Years old (1 - PCV) Never done   Zoster Vaccines- Shingrix (2 of 2) 04/06/2018   COVID-19 Vaccine (3 - Pfizer risk series) 10/24/2019   INFLUENZA VACCINE  Never done   MAMMOGRAM  09/13/2023   TETANUS/TDAP  10/07/2023   COLONOSCOPY (Pts 45-90yr Insurance coverage will need to be confirmed)  10/09/2024   DEXA SCAN  Completed   HPV VACCINES  Aged Out   Health Maintenance Review LIFESTYLE:  Exercise:   Tobacco/ETS: Alcohol:  Sugar beverages: Sleep: Drug use: no HH of  Work:    ROS:  GEN/ HEENT: No fever, significant weight changes sweats headaches vision problems hearing changes, CV/ PULM; No chest pain shortness of breath cough, syncope,edema  change in exercise tolerance. GI /GU: No adominal pain, vomiting, change in bowel habits. No blood in the stool. No significant GU symptoms. SKIN/HEME: ,no acute skin rashes suspicious lesions or bleeding. No lymphadenopathy, nodules, masses.  NEURO/ PSYCH:  No neurologic signs such as weakness numbness. No depression anxiety. IMM/ Allergy: No unusual infections.  Allergy .   REST of 12 system review negative except as per HPI   Past Medical History:  Diagnosis Date   Beta thalassemia trait    Cataract Early stage   Foot fracture    at gym   Osteoporosis    Peripheral neuropathy 04/01/2020    Past Surgical History:  Procedure Laterality Date   COLONOSCOPY  2006   normal -Dr.Brodie   COLONOSCOPY  2012   Dr.Brodie-hyperplastic polyps   FRACTURE SURGERY  Meniscus repair   left knee surgery  2009   meniscus    [place]   normal for syncope     Family History  Problem Relation Age of Onset   Thyroid disease Mother        partial thyroidectomy   Pancreatic cancer Mother 710  Cirrhosis  Father        secondary to chronic hepatitis b infection   Other Son        B Thal trait   Breast cancer Paternal Aunt 67  Breast cancer Maternal Aunt 80   Pancreatic cancer Maternal Grandfather        dx 75-80   Melanoma Brother 626  Colon cancer Neg Hx    Esophageal cancer Neg Hx    Rectal cancer Neg Hx     Social History   Socioeconomic History   Marital status: Married    Spouse name: Not on file   Number of children: 3   Years of education: Not on file   Highest education level: Not on file  Occupational History   Occupation: Real Estate   Occupation: Self-employed  Tobacco Use   Smoking status: Never   Smokeless tobacco: Never  Vaping Use   Vaping Use: Never used  Substance and Sexual Activity   Alcohol use: Yes    Alcohol/week: 1.0 - 4.0 standard drink of alcohol    Types: 1 - 4 Glasses of wine per week    Comment: occ   Drug use: Never   Sexual activity: Not on file  Other Topics Concern   Not on file  Social History Narrative   Lives at home with husband  2-3 caffeine drinks daily   Patient is right handed.         Social Determinants of Health   Financial Resource Strain: Not on file  Food Insecurity: Not on file  Transportation Needs: Not on file  Physical Activity: Not on file  Stress: Not on file  Social Connections: Not on file    Outpatient Medications Prior to Visit  Medication Sig Dispense Refill   CALCIUM PO Take by mouth.     Cholecalciferol (VITAMIN D3 PO) Take by mouth.     Multiple Vitamin (MULTIVITAMIN) tablet Take 1 tablet by mouth daily.     Wheat Dextrin (BENEFIBER DRINK MIX PO) Take by mouth in the morning and at bedtime. 3 teaspoons two times  daily     No facility-administered medications prior to visit.     EXAM:  There were no vitals taken for this visit.  There is no height or weight on file to calculate BMI. Wt Readings from Last 3 Encounters:  12/15/21 148 lb (67.1 kg)  10/09/21 146 lb (66.2 kg)  09/19/21  149 lb 9.6 oz (67.9 kg)    Physical Exam: Vital signs reviewed WCB:JSEG is a well-developed well-nourished alert cooperative    who appearsr stated age in no acute distress.  HEENT: normocephalic atraumatic , Eyes: PERRL EOM's full, conjunctiva clear, Nares: paten,t no deformity discharge or tenderness., Ears: no deformity EAC's clear TMs with normal landmarks. Mouth: clear OP, no lesions, edema.  Moist mucous membranes. Dentition in adequate repair. NECK: supple without masses, thyromegaly or bruits. CHEST/PULM:  Clear to auscultation and percussion breath sounds equal no wheeze , rales or rhonchi. No chest wall deformities or tenderness. Breast: normal by inspection . No dimpling, discharge, masses, tenderness or discharge . CV: PMI is nondisplaced, S1 S2 no gallops, murmurs, rubs. Peripheral pulses are full without delay.No JVD .  ABDOMEN: Bowel sounds normal nontender  No guard or rebound, no hepato splenomegal no CVA tenderness.  No hernia. Extremtities:  No clubbing cyanosis or edema, no acute joint swelling or redness no focal atrophy NEURO:  Oriented x3, cranial nerves 3-12 appear to be intact, no obvious focal weakness,gait within normal limits no abnormal reflexes or asymmetrical SKIN: No acute rashes normal turgor, color, no bruising or petechiae. PSYCH: Oriented, good eye contact, no obvious depression anxiety, cognition and judgment appear normal. LN: no cervical axillary inguinal adenopathy  Lab Results  Component Value Date   WBC 6.3 03/06/2021   HGB 12.3 03/06/2021   HCT 38.7 03/06/2021   PLT 205.0 03/06/2021   GLUCOSE 93 01/21/2022   CHOL 221 (H) 03/06/2021   TRIG 66.0 03/06/2021   HDL 86.60 03/06/2021   LDLDIRECT 123.1 03/02/2011   LDLCALC 121 (H) 03/06/2021   ALT 13 03/06/2021   AST 16 03/06/2021   NA 137 01/21/2022   K 3.9 01/21/2022   CL 102 01/21/2022   CREATININE 0.78 01/21/2022   BUN 16 01/21/2022   CO2 29 01/21/2022   TSH 1.92 03/06/2021    BP  Readings from Last 3 Encounters:  12/15/21 102/64  10/09/21 108/76  09/19/21 110/76    Lab results reviewed with patient   ASSESSMENT AND PLAN:  Discussed the following assessment and plan:  No diagnosis found. No follow-ups on file.  Patient Care Team: Wanette Robison, Standley Brooking, MD as PCP - General Azucena Fallen, MD (Obstetrics and Gynecology) Wylene Simmer, MD as Consulting Physician (Orthopedic Surgery) There are no Patient Instructions on file for this visit.  Standley Brooking. Heather Goodwin  M.D.  

## 2022-04-01 ENCOUNTER — Ambulatory Visit (INDEPENDENT_AMBULATORY_CARE_PROVIDER_SITE_OTHER): Payer: Medicare HMO | Admitting: Internal Medicine

## 2022-04-01 ENCOUNTER — Encounter: Payer: Self-pay | Admitting: Internal Medicine

## 2022-04-01 ENCOUNTER — Ambulatory Visit: Payer: Medicare HMO | Attending: Gastroenterology | Admitting: Physical Therapy

## 2022-04-01 VITALS — BP 108/80 | HR 82 | Temp 97.7°F | Ht 66.0 in | Wt 146.2 lb

## 2022-04-01 DIAGNOSIS — G479 Sleep disorder, unspecified: Secondary | ICD-10-CM

## 2022-04-01 DIAGNOSIS — Z79899 Other long term (current) drug therapy: Secondary | ICD-10-CM | POA: Diagnosis not present

## 2022-04-01 DIAGNOSIS — M6281 Muscle weakness (generalized): Secondary | ICD-10-CM

## 2022-04-01 DIAGNOSIS — G609 Hereditary and idiopathic neuropathy, unspecified: Secondary | ICD-10-CM | POA: Diagnosis not present

## 2022-04-01 DIAGNOSIS — D568 Other thalassemias: Secondary | ICD-10-CM

## 2022-04-01 DIAGNOSIS — M810 Age-related osteoporosis without current pathological fracture: Secondary | ICD-10-CM | POA: Diagnosis not present

## 2022-04-01 DIAGNOSIS — R279 Unspecified lack of coordination: Secondary | ICD-10-CM

## 2022-04-01 DIAGNOSIS — Z0000 Encounter for general adult medical examination without abnormal findings: Secondary | ICD-10-CM

## 2022-04-01 LAB — CBC WITH DIFFERENTIAL/PLATELET
Basophils Absolute: 0 10*3/uL (ref 0.0–0.1)
Basophils Relative: 0.5 % (ref 0.0–3.0)
Eosinophils Absolute: 0.1 10*3/uL (ref 0.0–0.7)
Eosinophils Relative: 1.9 % (ref 0.0–5.0)
HCT: 41.9 % (ref 36.0–46.0)
Hemoglobin: 13.4 g/dL (ref 12.0–15.0)
Lymphocytes Relative: 24.9 % (ref 12.0–46.0)
Lymphs Abs: 1.8 10*3/uL (ref 0.7–4.0)
MCHC: 32.1 g/dL (ref 30.0–36.0)
MCV: 62.5 fl — ABNORMAL LOW (ref 78.0–100.0)
Monocytes Absolute: 0.6 10*3/uL (ref 0.1–1.0)
Monocytes Relative: 8 % (ref 3.0–12.0)
Neutro Abs: 4.6 10*3/uL (ref 1.4–7.7)
Neutrophils Relative %: 64.7 % (ref 43.0–77.0)
Platelets: 243 10*3/uL (ref 150.0–400.0)
RBC: 6.7 Mil/uL — ABNORMAL HIGH (ref 3.87–5.11)
RDW: 15.2 % (ref 11.5–15.5)
WBC: 7.1 10*3/uL (ref 4.0–10.5)

## 2022-04-01 LAB — HEPATIC FUNCTION PANEL
ALT: 14 U/L (ref 0–35)
AST: 19 U/L (ref 0–37)
Albumin: 4.6 g/dL (ref 3.5–5.2)
Alkaline Phosphatase: 54 U/L (ref 39–117)
Bilirubin, Direct: 0.1 mg/dL (ref 0.0–0.3)
Total Bilirubin: 0.5 mg/dL (ref 0.2–1.2)
Total Protein: 7.6 g/dL (ref 6.0–8.3)

## 2022-04-01 LAB — LIPID PANEL
Cholesterol: 232 mg/dL — ABNORMAL HIGH (ref 0–200)
HDL: 86.1 mg/dL (ref >39.00)
LDL Cholesterol: 113 mg/dL — ABNORMAL HIGH (ref 0–99)
NonHDL: 145.67
Total CHOL/HDL Ratio: 3
Triglycerides: 164 mg/dL — ABNORMAL HIGH (ref 0.0–149.0)
VLDL: 32.8 mg/dL (ref 0.0–40.0)

## 2022-04-01 LAB — TSH: TSH: 1.85 u[IU]/mL (ref 0.35–5.50)

## 2022-04-01 LAB — VITAMIN B12: Vitamin B-12: 439 pg/mL (ref 211–911)

## 2022-04-01 NOTE — Patient Instructions (Addendum)
Lab today  Checking b12 . Today also . Second shingrix at pharmacy. Prevnar 20  in future at your  pharmacy.

## 2022-04-01 NOTE — Patient Instructions (Addendum)
Types of Fiber  There are two main types of fiber:  insoluble and soluble.  Both of these types can prevent and relieve constipation and diarrhea, although some people find one or the other to be more easily digested.  This handout details information about both types of fiber. recommended 25-35 grams of fiber per day,  average 9-12 grams per meal   key is a balance between soluble and insoluble  Insoluble Fiber        Functions of Insoluble Fiber moves bulk through the intestines  controls and balances the pH (acidity) in the intestines   This type of fiber should be avoided or reduced if you have soft, frequent bowel movements or leakage      Benefits of Insoluble Fiber promotes regular bowel movement and prevents constipation  removes fecal waste through colon in less time  keeps an optimal pH in intestines to prevent microbes from producing cancer substances, therefore preventing colon cancer        Food Sources of Insoluble Fiber whole-wheat products  wheat bran "miller's bran" corn bran  flax seed or other seeds vegetables such as green beans, broccoli, cauliflower and potato skins  fruit skins and root vegetable skins  popcorn brown rice  Soluble Fiber( Types 5,6,7)       Functions of Soluble Fiber  holds water in the colon to bulk and soften the stool prolongs stomach emptying time so that sugar is released and absorbed more slowly  prevent leakage associated with soft, frequent bowel movements.        Benefits of Soluble Fiber lowers total cholesterol and LDL cholesterol (the bad cholesterol) therefore reducing the risk of heart disease  regulates blood sugar for people with diabetes       Food Sources of Soluble Fiber oat/oat bran dried beans and peas  nuts  barley  flax seed or other seeds fruits such as oranges, pears, peaches, and apples  vegetables such as carrots  psyllium husk  prunes  
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
## 2022-04-05 NOTE — Progress Notes (Signed)
Blood count the same Thyroid blood sugar  and b12 are all normal  Triglycerides borderline can be higher when  not fasting    Continue lifestyle intervention healthy eating and exercise .  The 10-year ASCVD risk score (Arnett DK, et al., 2019) is: 5.9%   Values used to calculate the score:     Age: 71 years     Sex: Female     Is Non-Hispanic African American: No     Diabetic: No     Tobacco smoker: No     Systolic Blood Pressure: 259 mmHg     Is BP treated: No     HDL Cholesterol: 86.1 mg/dL     Total Cholesterol: 232 mg/dL

## 2022-04-10 DIAGNOSIS — Z0100 Encounter for examination of eyes and vision without abnormal findings: Secondary | ICD-10-CM | POA: Diagnosis not present

## 2022-04-14 ENCOUNTER — Telehealth: Payer: Self-pay | Admitting: Internal Medicine

## 2022-04-14 NOTE — Telephone Encounter (Signed)
Pt was returning call by [facility] and wanted to see if Garnette Czech can give her a call back.   Please advise.

## 2022-04-16 ENCOUNTER — Encounter: Payer: Self-pay | Admitting: Internal Medicine

## 2022-04-22 ENCOUNTER — Telehealth: Payer: Self-pay | Admitting: Plastic Surgery

## 2022-04-22 ENCOUNTER — Ambulatory Visit: Payer: Medicare HMO | Admitting: Physical Therapy

## 2022-04-22 NOTE — Telephone Encounter (Signed)
Would like a call back to discuss appropiate option for facial procedure (Halo etc?).

## 2022-05-06 ENCOUNTER — Ambulatory Visit: Payer: Medicare HMO | Attending: Gastroenterology | Admitting: Physical Therapy

## 2022-05-06 DIAGNOSIS — R279 Unspecified lack of coordination: Secondary | ICD-10-CM | POA: Diagnosis not present

## 2022-05-06 DIAGNOSIS — M6281 Muscle weakness (generalized): Secondary | ICD-10-CM | POA: Diagnosis not present

## 2022-05-06 NOTE — Patient Instructions (Signed)
Types of Fiber  There are two main types of fiber:  insoluble and soluble.  Both of these types can prevent and relieve constipation and diarrhea, although some people find one or the other to be more easily digested.  This handout details information about both types of fiber. recommended 25-35 grams of fiber per day,  average 9-12 grams per meal   key is a balance between soluble and insoluble  Insoluble Fiber        Functions of Insoluble Fiber moves bulk through the intestines  controls and balances the pH (acidity) in the intestines   This type of fiber should be avoided or reduced if you have soft, frequent bowel movements or leakage      Benefits of Insoluble Fiber promotes regular bowel movement and prevents constipation  removes fecal waste through colon in less time  keeps an optimal pH in intestines to prevent microbes from producing cancer substances, therefore preventing colon cancer        Food Sources of Insoluble Fiber whole-wheat products  wheat bran "miller's bran" corn bran  flax seed or other seeds vegetables such as green beans, broccoli, cauliflower and potato skins  fruit skins and root vegetable skins  popcorn brown rice  Soluble Fiber( Types 5,6,7)       Functions of Soluble Fiber  holds water in the colon to bulk and soften the stool prolongs stomach emptying time so that sugar is released and absorbed more slowly  prevent leakage associated with soft, frequent bowel movements.        Benefits of Soluble Fiber lowers total cholesterol and LDL cholesterol (the bad cholesterol) therefore reducing the risk of heart disease  regulates blood sugar for people with diabetes       Food Sources of Soluble Fiber oat/oat bran dried beans and peas  nuts  barley  flax seed or other seeds fruits such as oranges, pears, peaches, and apples  vegetables such as carrots  psyllium husk  prunes
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
## 2022-05-12 ENCOUNTER — Telehealth: Payer: Self-pay | Admitting: Plastic Surgery

## 2022-05-12 NOTE — Telephone Encounter (Signed)
LVM for pt to call back and schedule a televisit appt at provider's request.

## 2022-05-12 NOTE — Progress Notes (Unsigned)
Botulinum Toxin Procedure Note   Procedure: Cosmetic botulinum toxin   Pre-operative Diagnosis: Dynamic rhytides   Post-operative Diagnosis: Same   Complications:  None   Brief history: The patient desires botulinum toxin injection.  They are aware of the risks including bleeding, damage to deeper structures, asymmetry, brow ptosis, eyelid ptosis, bruising. The patient understands and wishes to proceed.  She tells me that she has had Botox before, but never here with this clinic.  She knows that she wants neurotoxins to address her frontalis, lateral canthal, and glabellar rhytids.  She then tells me that she also would like to have filler to the nasolabial folds and would consider laser treatments, as well.  She does complain about fine wrinkling above the upper lip which she was informed may be improved with the halo laser.  From a Botox standpoint, she is hoping that her lateral eyebrows can be elevated with the neurotoxins.  Discussed administering a dose to the superior lateral aspect of orbicularis oculi.  Before picture was obtained and placed in chart.   Procedure: The area was prepped with alcohol and dried with a clean gauze.  Using a clean technique the botulinum toxin was diluted with 2.5 mL of bacteriostatic saline per 100 unit vial which resulted in 4 units per 0.1 mL.   Subsequently the mixture was injected in the glabellar, forehead area with preservation of the temporal branch to the lateral eyebrow. A total of 52 Units of botulinum toxin was used. The forehead, glabella and crows feet were prepped and injected in a standard injection pattern using 2 injection points for the crows feet (8 units each side), 5-point V-shape glabellar/procerus injection (20 units), and 1 rows for the forehead (16 units).  They tolerated this fine.      No complications were noted. Light pressure was held for 5 minutes. They were instructed explicitly in post-operative care.   Botox LOT:   [id number] EXP:  2/26

## 2022-05-13 ENCOUNTER — Ambulatory Visit (INDEPENDENT_AMBULATORY_CARE_PROVIDER_SITE_OTHER): Payer: Self-pay | Admitting: Physician Assistant

## 2022-05-13 DIAGNOSIS — L988 Other specified disorders of the skin and subcutaneous tissue: Secondary | ICD-10-CM

## 2022-05-20 ENCOUNTER — Encounter: Payer: Medicare HMO | Admitting: Physical Therapy

## 2022-06-10 DIAGNOSIS — H2513 Age-related nuclear cataract, bilateral: Secondary | ICD-10-CM | POA: Diagnosis not present

## 2022-06-24 ENCOUNTER — Ambulatory Visit: Payer: Medicare HMO | Admitting: Physical Therapy

## 2022-07-08 DIAGNOSIS — R69 Illness, unspecified: Secondary | ICD-10-CM | POA: Diagnosis not present

## 2022-07-15 ENCOUNTER — Encounter: Payer: Self-pay | Admitting: Gastroenterology

## 2022-07-15 DIAGNOSIS — H43812 Vitreous degeneration, left eye: Secondary | ICD-10-CM | POA: Diagnosis not present

## 2022-07-15 DIAGNOSIS — G43909 Migraine, unspecified, not intractable, without status migrainosus: Secondary | ICD-10-CM | POA: Diagnosis not present

## 2022-07-15 DIAGNOSIS — H5310 Unspecified subjective visual disturbances: Secondary | ICD-10-CM | POA: Diagnosis not present

## 2022-07-21 ENCOUNTER — Encounter: Payer: Self-pay | Admitting: Internal Medicine

## 2022-07-22 ENCOUNTER — Other Ambulatory Visit: Payer: Self-pay | Admitting: Internal Medicine

## 2022-07-22 DIAGNOSIS — M810 Age-related osteoporosis without current pathological fracture: Secondary | ICD-10-CM

## 2022-08-03 ENCOUNTER — Other Ambulatory Visit: Payer: Self-pay | Admitting: Obstetrics & Gynecology

## 2022-08-03 ENCOUNTER — Other Ambulatory Visit: Payer: Self-pay | Admitting: Internal Medicine

## 2022-08-03 DIAGNOSIS — Z1231 Encounter for screening mammogram for malignant neoplasm of breast: Secondary | ICD-10-CM

## 2022-08-03 DIAGNOSIS — M810 Age-related osteoporosis without current pathological fracture: Secondary | ICD-10-CM

## 2022-08-08 DIAGNOSIS — R69 Illness, unspecified: Secondary | ICD-10-CM | POA: Diagnosis not present

## 2022-08-25 ENCOUNTER — Encounter: Payer: Self-pay | Admitting: Plastic Surgery

## 2022-08-25 ENCOUNTER — Ambulatory Visit (INDEPENDENT_AMBULATORY_CARE_PROVIDER_SITE_OTHER): Payer: Self-pay | Admitting: Plastic Surgery

## 2022-08-25 DIAGNOSIS — Z719 Counseling, unspecified: Secondary | ICD-10-CM

## 2022-08-25 NOTE — Progress Notes (Signed)
HALO Treatment   Treatment  Settings:       In Media   Topical and/or Block: Lidocaine 23% / tetracaine 7% ointment  Post Care: instructions given  Notes: face and neck

## 2022-08-26 ENCOUNTER — Ambulatory Visit
Admission: RE | Admit: 2022-08-26 | Discharge: 2022-08-26 | Disposition: A | Discharge status: Home / Self Care | Payer: Medicare HMO | Source: Ambulatory Visit | Attending: Internal Medicine | Admitting: Internal Medicine

## 2022-08-26 DIAGNOSIS — M85852 Other specified disorders of bone density and structure, left thigh: Secondary | ICD-10-CM | POA: Diagnosis not present

## 2022-08-26 DIAGNOSIS — M810 Age-related osteoporosis without current pathological fracture: Secondary | ICD-10-CM | POA: Diagnosis not present

## 2022-08-26 DIAGNOSIS — Z780 Asymptomatic menopausal state: Secondary | ICD-10-CM | POA: Diagnosis not present

## 2022-08-31 ENCOUNTER — Telehealth: Payer: Self-pay | Admitting: Physician Assistant

## 2022-08-31 NOTE — Telephone Encounter (Signed)
I was able to return her phone call, she is doing well I clarified the post care instructions.  No significant issues or concerns at this time.

## 2022-08-31 NOTE — Telephone Encounter (Signed)
Pt says she is using the cleanser and moisturizer given by [place] at HALO appt. Pt says it is confusing that the paper instructions say to use Cerave. Should she change over to [facility] after a week or two?  She also states she still has black peeling on face. Please call pt back to go over healing time and process.

## 2022-09-01 ENCOUNTER — Other Ambulatory Visit: Payer: Medicare HMO

## 2022-09-06 DIAGNOSIS — R69 Illness, unspecified: Secondary | ICD-10-CM | POA: Diagnosis not present

## 2022-09-15 ENCOUNTER — Ambulatory Visit
Admission: RE | Admit: 2022-09-15 | Discharge: 2022-09-15 | Disposition: A | Discharge status: Home / Self Care | Payer: Medicare HMO | Source: Ambulatory Visit | Attending: Obstetrics & Gynecology | Admitting: Obstetrics & Gynecology

## 2022-09-15 DIAGNOSIS — Z1231 Encounter for screening mammogram for malignant neoplasm of breast: Secondary | ICD-10-CM | POA: Diagnosis not present

## 2022-09-16 DIAGNOSIS — Z6823 Body mass index (BMI) 23.0-23.9, adult: Secondary | ICD-10-CM | POA: Diagnosis not present

## 2022-09-16 DIAGNOSIS — Z124 Encounter for screening for malignant neoplasm of cervix: Secondary | ICD-10-CM | POA: Diagnosis not present

## 2022-09-16 DIAGNOSIS — M810 Age-related osteoporosis without current pathological fracture: Secondary | ICD-10-CM | POA: Diagnosis not present

## 2022-09-16 DIAGNOSIS — Z01419 Encounter for gynecological examination (general) (routine) without abnormal findings: Secondary | ICD-10-CM | POA: Diagnosis not present

## 2022-09-16 DIAGNOSIS — Z01411 Encounter for gynecological examination (general) (routine) with abnormal findings: Secondary | ICD-10-CM | POA: Diagnosis not present

## 2022-09-17 ENCOUNTER — Ambulatory Visit: Payer: Medicare HMO

## 2022-09-23 ENCOUNTER — Ambulatory Visit: Payer: Medicare HMO | Admitting: Internal Medicine

## 2022-09-24 ENCOUNTER — Ambulatory Visit: Payer: Medicare HMO | Admitting: Internal Medicine

## 2022-09-24 ENCOUNTER — Encounter: Payer: Self-pay | Admitting: Internal Medicine

## 2022-09-24 VITALS — BP 116/80 | HR 81 | Ht 66.0 in | Wt 151.2 lb

## 2022-09-24 DIAGNOSIS — M810 Age-related osteoporosis without current pathological fracture: Secondary | ICD-10-CM | POA: Diagnosis not present

## 2022-09-24 LAB — VITAMIN D 25 HYDROXY (VIT D DEFICIENCY, FRACTURES): VITD: 42.19 ng/mL (ref 30.00–100.00)

## 2022-09-24 NOTE — Progress Notes (Signed)
Patient ID: Heather Goodwin, female   DOB: 10/11/1950, 72 y.o.   MRN: [id number]  HPI  CANDECE Goodwin is a 72 y.o.-year-old female, referred by her PCP, Dr.Panosh, for management of osteoporosis (OP).  Last visit 1 year ago.  Interim history: No falls or fractures since last visit. No dizziness/vertigo/orthostasis/poor vision.  Approximately 3 months ago she started a different bone supplement -however, she is taking lower calcium and vitamin D doses than previously. She is now going consistently to [employer].  She is also doing more weightbearing exercises.  Reviewed and addended history: Pt was dx with OP in 2020.  I reviewed pt's DXA scans: Date L1-L4 T score FN T score 33% distal Radius Ultradistal radius  08/26/2022 L1-L4 (L3): -2.7 RFN: -1.3 LFN: -1.8 -2.6 -3.5  07/16/2021 -3.0 RFN: -1.4 LFN: -1.6 -3.1 -3.7  01/18/2019 -2.5 RFN: -1.0 LFN: -1.5 N/a N/a  09/26/2013 -2.1 RFN: n/a LFN: -1.4 N/a  N/a   Fractures: - Right foot in 2007 - from jumping on a trampoline in the gym - Left wrist 2018 - slipped - however, unclear if full fracture or just sprain  She was not on previous OP treatments.  She would not want to start any.  No h/o vitamin D deficiency.  No available vit D levels for review. Lab Results  Component Value Date   VD25OH 46.63 09/19/2021   Pt is on: - calcium 600 mg 2 tabs a day >> 150 mg daily - vitamin D 2000 >> 4000 units >> 500 units now -  + some weight bearing exercises - uses weights - started weight lifting.  She is walking/hiking/playing tennis.  She does not take high vitamin A doses.  Menopause was at 72 y/o.   FH of osteoporosis: mother, PGM.  No h/o hyper/hypocalcemia or hyperparathyroidism. No h/o kidney stones. Lab Results  Component Value Date   PTH 22 09/19/2021   PTH Comment 09/19/2021   CALCIUM 9.7 01/21/2022   CALCIUM 10.0 09/19/2021   CALCIUM 10.0 03/06/2021   CALCIUM 9.9 10/03/2019   CALCIUM 10.1 01/13/2018   CALCIUM 10.1  03/02/2011   CALCIUM 9.8 12/25/2009   CALCIUM 10.1 09/19/2007   No h/o thyrotoxicosis. Reviewed TSH recent levels:  Lab Results  Component Value Date   TSH 1.85 04/01/2022   TSH 1.92 03/06/2021   TSH 1.85 10/03/2019   TSH 1.17 01/13/2018   TSH 1.16 03/02/2011   No h/o CKD. Last BUN/Cr: Lab Results  Component Value Date   BUN 16 01/21/2022   CREATININE 0.78 01/21/2022   SPEP negative: Component     Latest Ref Rng 04/01/2020  IgG (Immunoglobin G), Serum     586 - 1,602 mg/dL 929   IgA/Immunoglobulin A, Serum     87 - 352 mg/dL 269   IgM (Immunoglobulin M), Srm     26 - 217 mg/dL 120   Total Protein     6.0 - 8.5 g/dL 6.9   Albumin SerPl Elph-Mcnc     2.9 - 4.4 g/dL 4.1   Alpha 1     0.0 - 0.4 g/dL 0.2   Alpha2 Glob SerPl Elph-Mcnc     0.4 - 1.0 g/dL 0.6   B-Globulin SerPl Elph-Mcnc     0.7 - 1.3 g/dL 1.0   Gamma Glob SerPl Elph-Mcnc     0.4 - 1.8 g/dL 1.0   M Protein SerPl Elph-Mcnc     Not Observed g/dL Not Observed   Globulin, Total     2.2 -  3.9 g/dL 2.8   Albumin/Glob SerPl     0.7 - 1.7  1.5   IFE 1 immunofixation pattern appears unremarkable. Evidence of  monoclonal protein is not apparent.  Please Note (HCV): Comment    In 2023, she was on colestipol 1 g twice a day -used for stool incontinence/diarrhea when hiking.  She started to decrease the dose to once a day and came off since last visit.  ROS: + See HPI + Tinnitus, hypoacusis  I reviewed pt's medications, allergies, PMH, social hx, family hx, and changes were documented in the history of present illness. Otherwise, unchanged from my initial visit note.  Past Medical History:  Diagnosis Date   Beta thalassemia trait    Cataract Early stage   Foot fracture    at gym   Osteoporosis    Peripheral neuropathy 04/01/2020   Past Surgical History:  Procedure Laterality Date   COLONOSCOPY  2006   normal -Dr.Brodie   COLONOSCOPY  2012   Dr.Brodie-hyperplastic polyps   FRACTURE SURGERY  Meniscus  repair   left knee surgery  2009   meniscus    US ECHOCARDIOGRAPHY  2005   normal for syncope    Social History   Socioeconomic History   Marital status: Married    Spouse name: Not on file   Number of children: 3   Years of education: Not on file   Highest education level: Not on file  Occupational History   Occupation: Real Estate   Occupation: Self-employed  Tobacco Use   Smoking status: Never   Smokeless tobacco: Never  Vaping Use   Vaping Use: Never used  Substance and Sexual Activity   Alcohol use: Yes    Alcohol/week: 1.0 - 4.0 standard drink of alcohol    Types: 1 - 4 Glasses of wine per week    Comment: occ   Drug use: Never   Sexual activity: Not on file  Other Topics Concern   Not on file  Social History Narrative   Lives at home with husband   2-3 caffeine drinks daily   Patient is right handed.         Social Determinants of Health   Financial Resource Strain: Not on file  Food Insecurity: Not on file  Transportation Needs: Not on file  Physical Activity: Not on file  Stress: Not on file  Social Connections: Not on file  Intimate Partner Violence: Not on file   Current Outpatient Medications on File Prior to Visit  Medication Sig Dispense Refill   CALCIUM PO Take by mouth.     Wheat Dextrin (BENEFIBER DRINK MIX PO) Take by mouth in the morning and at bedtime. 3 teaspoons two times  daily     No current facility-administered medications on file prior to visit.   No Known Allergies  Family History  Problem Relation Age of Onset   Thyroid disease Mother        partial thyroidectomy   Pancreatic cancer Mother 57   Cirrhosis Father        secondary to chronic hepatitis b infection   Other Son        B Thal trait   Breast cancer Paternal Aunt 79   Breast cancer Maternal Aunt 80   Pancreatic cancer Maternal Grandfather        dx 10-80   Melanoma Brother 31   Colon cancer Neg Hx    Esophageal cancer Neg Hx    Rectal cancer Neg Hx  PE: BP 116/80 (BP Location: Right Arm, Patient Position: Sitting, Cuff Size: Normal)   Pulse 81   Ht 5\' 6"  (1.676 m)   Wt 151 lb 3.2 oz (68.6 kg)   SpO2 99%   BMI 24.40 kg/m  Wt Readings from Last 3 Encounters:  09/24/22 151 lb 3.2 oz (68.6 kg)  04/01/22 146 lb 3.2 oz (66.3 kg)  12/15/21 148 lb (67.1 kg)   Constitutional: normal weight, in NAD Eyes:  EOMI, no exophthalmos ENT: no neck masses, no cervical lymphadenopathy Cardiovascular: RRR, No MRG Respiratory: CTA B Musculoskeletal: no deformities Skin:no rashes Neurological: no tremor with outstretched hands  Assessment: 1. Osteoporosis  Plan: 1. Osteoporosis -Likely postmenopausal/age-related, she also has family history of osteoporosis -No falls or fractures since last visit -There is no clear history of fragility fracture.  She had a foot fracture in the past, however, we discussed that this is not considered definitive fracture.  She also had a reported left distal radial fracture but this was not obvious on the recent left forearm imaging (on DXA) and it was unclear if this was actually a fracture or just a sprain. -Reviewing the previous bone density scans together, she does have an increased risk of fracture, however, the last T-scores appear to have slightly improved at the level of the spine, right femoral neck, and ultra distal radius, but with a more significant improvement at the 33% distal radius.  -We reviewed her dietary and supplemental calcium and vitamin D intake.  We discussed about getting a proximal currently 1200 mg of calcium daily preferentially from the diet.  At last visit she was actually getting at least 500 mg from supplements only.  I did recommend to reduce these especially since she was on a more plant-based diet and plenty of green leafy vegetables and dairy.  She is currently taking approximately 150 mg daily.  Regarding vitamin D, she is now on the lower dose  daily from supplements (500 units),  inadvertently.  At last visit, the vitamin D level was at goal but she was taking this from a several vitamin D supplement and from multivitamin.  We did discuss at that time that she was on colestyramine which could decrease the absorption of most nutrients including calcium and vitamin D and she was titrating the dose down.  She did not feel that this was helping her diarrhea enough to continue on it.  She stopped cholestyramine since last visit. -At last visit I gave her the handout from [facility]: weight bearing exercises - advised to do this every day or at least 5/7 days.  She is doing more weightbearing exercises now.  At last visit we discussed about including wrist and ankle weight, backpack when walking, walking downhill is much as she can, and walking after rather than before meals.  I also previously recommended the [facility] for skeletal loading and explained the benefits.  She continues to work with them weekly now. -She asks me my opinion about starting sodium citrate and I explained that this was not studied in fracture outcomes trials so I cannot advise her to take it -I recommended low acid eating -she is on a more plant-based diet and I advised her to continue.  She is not smoking or drinking more than 2 alcoholic drinks a day. -At last visit we discussed about the different medication classes, benefits, and side effects, including atypical fractures and ONJ.  We reviewed different options.  She continues to prefer to  manage her osteoporosis without medications.-At today's visit, we will check a  a vitamin D level.  At last visit, calcium, vitamin D, and PTH levels were all normal.  We checked a PTH level as her lowest bone density score was at the distal radius.  We will not repeat this today. -Plan to repeat another bone density scan in a year, per her preference, if insurance covers this.  She will contact me before the visit to order this. - will see pt  back in 1 year  - Total time spent for the visit: 30 minutes, in precharting, post charting,, in obtaining medical information from the chart, reviewing her  previous labs, imaging evaluations, and treatments, reviewing her symptoms, counseling her about her condition (please see the discussed topics above), and developing a plan to further investigate and treat it; she had a number of questions which I addressed.   Component     Latest Ref Rng 09/24/2022  VITD     30.00 - 100.00 ng/mL 42.19    Philemon Kingdom, MD PhD [facility] Endocrinology

## 2022-09-29 ENCOUNTER — Ambulatory Visit (INDEPENDENT_AMBULATORY_CARE_PROVIDER_SITE_OTHER): Payer: Self-pay | Admitting: Physician Assistant

## 2022-09-29 ENCOUNTER — Ambulatory Visit: Payer: Medicare HMO | Admitting: Physician Assistant

## 2022-09-29 VITALS — Ht 66.5 in | Wt 148.0 lb

## 2022-09-29 DIAGNOSIS — Z719 Counseling, unspecified: Secondary | ICD-10-CM

## 2022-09-29 NOTE — Progress Notes (Signed)
Is a 72 year old female seen in our office for follow-up evaluation status post HALO on 08/31/2022.  Dr. Ulice Bold had the opportunity to have a lengthy discussion with the patient.  Since her procedure she notes she has been doing well.  She noted minimal pain was able to go work after the procedure.  She denied any healing issues.  She notes that she has some ongoing areas of concern with regards to nasolabial folds.  After review of her before-and-after photos she has noticed a difference in her skin tone.  Photos were taken at today's visit.  We advised the patient to continue monitoring for changes as these are generally not immediate and develop over time.  We would like to see her back in the office in 2 to 3 months for repeat evaluation.  She understands to reach out to [place] if she has any questions or concerns.

## 2022-10-06 ENCOUNTER — Ambulatory Visit: Payer: Medicare HMO | Admitting: Gastroenterology

## 2022-10-07 DIAGNOSIS — R69 Illness, unspecified: Secondary | ICD-10-CM | POA: Diagnosis not present

## 2022-10-15 ENCOUNTER — Ambulatory Visit: Payer: Medicare HMO | Admitting: Gastroenterology

## 2022-10-19 ENCOUNTER — Ambulatory Visit: Payer: Self-pay

## 2022-10-26 ENCOUNTER — Encounter (INDEPENDENT_AMBULATORY_CARE_PROVIDER_SITE_OTHER): Payer: Self-pay

## 2022-10-26 DIAGNOSIS — Z719 Counseling, unspecified: Secondary | ICD-10-CM

## 2022-11-06 DIAGNOSIS — R69 Illness, unspecified: Secondary | ICD-10-CM | POA: Diagnosis not present

## 2022-12-01 DIAGNOSIS — M25561 Pain in right knee: Secondary | ICD-10-CM | POA: Diagnosis not present

## 2022-12-07 DIAGNOSIS — R69 Illness, unspecified: Secondary | ICD-10-CM | POA: Diagnosis not present

## 2022-12-09 ENCOUNTER — Ambulatory Visit (INDEPENDENT_AMBULATORY_CARE_PROVIDER_SITE_OTHER): Payer: Self-pay | Admitting: Physician Assistant

## 2022-12-09 DIAGNOSIS — Z719 Counseling, unspecified: Secondary | ICD-10-CM

## 2022-12-09 NOTE — Progress Notes (Signed)
This is a 72 year old female seen in our office for follow-up evaluation status post HALO on 08/31/2022.  The patient notes she has not noticed a significant improvement from the halo.  I discussed expectations for the halo and our perceived improvement.  The patient verbalized that she thought her follow-up visit today was with Dr. Ulice Bold.  I encouraged her to make sure that when scheduling the visit it was with Dr. Ulice Bold.  Other treatment options were discussed including fillers, she would like to pursue this option and will schedule an appointment with Dr. Ulice Bold.

## 2023-02-08 NOTE — Progress Notes (Signed)
Heather Goodwin    [id number]    11/08/1950  Primary Care Physician:Panosh, Neta Mends, MD  Referring Physician: Madelin Headings, MD [address] [place],  [place] [address]    Chief complaint:  Fecal urgency Chief Complaint  Patient presents with   Encopresis    Pt reports this has subsided for the most part   HPI: 72 year old very pleasant female here for follow-up visit for fecal urgency, incontinence and IBS-diarrhea. Last seen on 12-16-22.   Today, she reports feeling well overall. She reports having only 3 episodes of fecal incontinences in the past year and states that her stool was soft but formed during the episodes of incontinence however, the chances of having an episode when her stool is loose or with diarrhea is higher. She has been doing exercises and weight lifting about 3x weekly, but states she only went to pelvic floor therapy few times as she didn't see any benefits from it during the last session as she was not getting specific anorectal physical therapy.  The initial session she had with Elnita Maxwell was very beneficial  We also discussed the possibility of undergoing an Interstim therapy to manage her symptoms.   She reports having Covid in July and has had a cough since then.she would experience it periodically throughout the day. She feels as if she has congestion in her throat. She denies experiencing any heartburn or coughing up any mucus or phlegm.   Patient denies diarrhea, constipation, nausea, blood in stool, black stool, vomiting, abdominal pain, bloating, unintentional weight loss, reflux, dysphagia.   GI Hx: Colonoscopy October 09, 2021 - Decreased sphincter tone found on digital rectal exam. - Three 3 to 10 mm polyps in the sigmoid colon and in the ascending colon, removed with a cold snare. Resected and retrieved. - A single colonic angioectasia. - Diverticulosis in the sigmoid colon.   Colonoscopy in 2006: Normal  Colonoscopy 2012  with hyperplastic rectal polyp and internal hemorrhoids otherwise normal exam   Current Outpatient Medications:    Wheat Dextrin (BENEFIBER DRINK MIX PO), Take by mouth in the morning and at bedtime. 3 teaspoons two times  daily, Disp: , Rfl:     Allergies as of 02/10/2023   (No Known Allergies)    Past Medical History:  Diagnosis Date   Beta thalassemia trait    Cataract Early stage   Foot fracture    at gym   Osteoporosis    Peripheral neuropathy 04/01/2020    Past Surgical History:  Procedure Laterality Date   COLONOSCOPY  2006   normal -Dr.Brodie   COLONOSCOPY  2012   Dr.Brodie-hyperplastic polyps   FRACTURE SURGERY  Meniscus repair   left knee surgery  2009   meniscus    US ECHOCARDIOGRAPHY  2005   normal for syncope     Family History  Problem Relation Age of Onset   Thyroid disease Mother        partial thyroidectomy   Pancreatic cancer Mother 85   Cirrhosis Father        secondary to chronic hepatitis b infection   Other Son        B Thal trait   Breast cancer Paternal Aunt 61   Breast cancer Maternal Aunt 75   Pancreatic cancer Maternal Grandfather        dx 75-80   Melanoma Brother 54   Colon cancer Neg Hx    Esophageal cancer Neg Hx  Rectal cancer Neg Hx     Social History   Socioeconomic History   Marital status: Married    Spouse name: Not on file   Number of children: 3   Years of education: Not on file   Highest education level: Not on file  Occupational History   Occupation: Real Estate   Occupation: Self-employed  Tobacco Use   Smoking status: Never   Smokeless tobacco: Never  Vaping Use   Vaping status: Never Used  Substance and Sexual Activity   Alcohol use: Yes    Alcohol/week: 1.0 - 4.0 standard drink of alcohol    Types: 1 - 4 Glasses of wine per week    Comment: occ   Drug use: Never   Sexual activity: Not on file  Other Topics Concern   Not on file  Social History Narrative   Lives at home with husband   2-3  caffeine drinks daily   Patient is right handed.         Social Determinants of Health   Financial Resource Strain: Not on file  Food Insecurity: Not on file  Transportation Needs: Not on file  Physical Activity: Not on file  Stress: Not on file  Social Connections: Not on file  Intimate Partner Violence: Not on file    Review of systems: Review of Systems  Constitutional:  Negative for unexpected weight change.  HENT:  Negative for trouble swallowing.   Respiratory:  Positive for cough.   Gastrointestinal:  Negative for abdominal distention, abdominal pain, anal bleeding, blood in stool, constipation, diarrhea, nausea, rectal pain and vomiting.       +fecal incontinence       Physical Exam: There were no vitals filed for this visit.  Body mass index is 23.18 kg/m. General: well-appearing   Eyes: sclera anicteric, no redness ENT: oral mucosa moist without lesions, no cervical or supraclavicular lymphadenopathy CV: RRR, no JVD, no peripheral edema Resp: clear to auscultation bilaterally, normal RR and effort noted GI: soft, no tenderness, with active bowel sounds. No guarding or palpable organomegaly noted. Skin; warm and dry, no rash or jaundice noted Neuro: awake, alert and oriented x 3. 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 Ashley Royalty at records [facility] to evaluate for possible InterStim  Postnasal drip and cough Advised patient to start low-dose  Pepcid 20 mg twice daily and add antihistamine, Xyzal 5 mg daily at bedtime Antireflux measures   Return in 1 year   The patient was provided an opportunity to ask questions and all were answered. The patient agreed with the plan and demonstrated an understanding of the instructions.  Iona Beard , MD    CC: Panosh, Neta Mends, MD   Ladona Mow Hewitt Shorts as a scribe for Marsa Aris, MD.,have documented all relevant documentation on the behalf of Marsa Aris, MD,as directed by  Marsa Aris, MD while in the presence of Marsa Aris, MD.   I, Marsa Aris, MD, have reviewed all documentation for this visit. The documentation on 02/10/23 for the exam, diagnosis,  procedures, and orders are all accurate and complete.

## 2023-02-10 ENCOUNTER — Encounter: Payer: Self-pay | Admitting: Gastroenterology

## 2023-02-10 ENCOUNTER — Ambulatory Visit: Payer: Medicare HMO | Admitting: Gastroenterology

## 2023-02-10 VITALS — BP 90/60 | HR 79 | Ht 67.0 in | Wt 148.0 lb

## 2023-02-10 DIAGNOSIS — R159 Full incontinence of feces: Secondary | ICD-10-CM

## 2023-02-10 DIAGNOSIS — K219 Gastro-esophageal reflux disease without esophagitis: Secondary | ICD-10-CM | POA: Diagnosis not present

## 2023-02-10 DIAGNOSIS — R053 Chronic cough: Secondary | ICD-10-CM

## 2023-02-10 NOTE — Patient Instructions (Addendum)
We have sent a referral in for Pelvic Floor Therapy to [facility], they should contact you with that appointment.  We will send in a referral to Dr. Ashley Royalty.    Take Pepcid 20mg  twice a day for 2-4 weeks.  Take Cetirizine/Xyzal 5mg  daily for 1-2 weeks.  Please follow up in one year.  _______________________________________________________  If your blood pressure at your visit was 140/90 or greater, please contact your primary care physician to follow up on this.  _______________________________________________________  If you are age 25 or older, your body mass index should be between 23-30. Your Body mass index is 23.18 kg/m. If this is out of the aforementioned range listed, please consider follow up with your Primary Care Provider.  If you are age 3 or younger, your body mass index should be between 19-25. Your Body mass index is 23.18 kg/m. If this is out of the aformentioned range listed, please consider follow up with your Primary Care Provider.   ________________________________________________________  The [facility] providers would like to encourage you to use [facility] to communicate with providers for non-urgent requests or questions.  Due to long hold times on the telephone, sending your provider a message by [facility] may be a faster and more efficient way to get a response.  Please allow 48 business hours for a response.  Please remember that this is for non-urgent requests.  _______________________________________________________

## 2023-02-12 ENCOUNTER — Encounter: Payer: Self-pay | Admitting: Gastroenterology

## 2023-04-06 ENCOUNTER — Encounter: Payer: Medicare HMO | Admitting: Internal Medicine

## 2023-04-14 NOTE — Progress Notes (Signed)
Chief Complaint  Patient presents with   Annual Exam    fasted    HPI: Patient  Heather Goodwin  72 y.o. comes in today for Preventive Health Care visit  Couple of issues   Osteoporosis   doing osteo strong diet other intervnetions  and has fu dexa  and sees dr Reece Agar  April yearly .  Since covid in July feels has   ? Sob  more than usual . More at rest hard to take breath  but no specific  change in exercise tolerance  Frog in throat  went to GI .   Minor cough   no sig phlegm.    Suggested ppi to try  incase  reflux. 6-7  "Tired  "sleeps but may wake up.  Not as much energy . In am as in past  no major change in routine  works and active physically.  Neuropathy no change : Feet  balls of feet .  Marland Kitchen Feels "thick " no weakness or  progression reported .  (Felt from  orig covid vaccine  vx  covid infection)   Health Maintenance  Topic Date Due   Medicare Annual Wellness (AWV)  Never done   COVID-19 Vaccine (3 - Pfizer risk series) 05/01/2023 (Originally 10/24/2019)   INFLUENZA VACCINE  09/20/2023 (Originally 01/21/2023)   Zoster Vaccines- Shingrix (2 of 2) 10/14/2023 (Originally 04/06/2018)   Pneumonia Vaccine 26+ Years old (1 of 1 - PCV) 04/14/2024 (Originally 03/27/2018)   DTaP/Tdap/Td (3 - Td or Tdap) 10/07/2023   MAMMOGRAM  09/14/2024   Colonoscopy  10/09/2024   DEXA SCAN  Completed   HPV VACCINES  Aged Out   Hepatitis C Screening  Discontinued   Health Maintenance Review LIFESTYLE:  Exercise:   regular  osteostrong and long walking hiking  Tobacco/ETS:n Alcohol: ocass  Sugar beverages: no Sleep:  o sometimes light  Drug use: no HH of  2  Work: [employer]    ROS:  no cps  withe eexertion  GEN/ HEENT: No fever, significant weight changes sweats headaches vision problems hearing changes, CV/ PULM; No chest pain shortness of breath cough, syncope,edema  change in exercise tolerance. GI /GU: No adominal pain, vomiting, change in bowel habits. No blood in  the stool. No significant GU symptoms. SKIN/HEME: ,no acute skin rashes suspicious lesions or bleeding. No lymphadenopathy, nodules, masses.  NEURO/ PSYCH:  No neurologic signs such as weakness numbness. No depression anxiety. IMM/ Allergy: No unusual infections.  Allergy .   REST of 12 system review negative except as per HPI   Past Medical History:  Diagnosis Date   Beta thalassemia trait    Cataract Early stage   Foot fracture    at gym   Osteoporosis    Peripheral neuropathy 04/01/2020    Past Surgical History:  Procedure Laterality Date   COLONOSCOPY  2006   normal -Dr.Brodie   COLONOSCOPY  2012   Dr.Brodie-hyperplastic polyps   FRACTURE SURGERY  Meniscus repair   left knee surgery  2009   meniscus    US ECHOCARDIOGRAPHY  2005   normal for syncope     Family History  Problem Relation Age of Onset   Thyroid disease Mother        partial thyroidectomy   Pancreatic cancer Mother 28   Cirrhosis Father        secondary to chronic hepatitis b infection   Other Son        B Thal trait  Breast cancer Paternal Aunt 55   Breast cancer Maternal Aunt 80   Pancreatic cancer Maternal Grandfather        dx 75-80   Melanoma Brother 22   Colon cancer Neg Hx    Esophageal cancer Neg Hx    Rectal cancer Neg Hx     Social History   Socioeconomic History   Marital status: Married    Spouse name: Not on file   Number of children: 3   Years of education: Not on file   Highest education level: Not on file  Occupational History   Occupation: Real Estate   Occupation: Self-employed  Tobacco Use   Smoking status: Never   Smokeless tobacco: Never  Vaping Use   Vaping status: Never Used  Substance and Sexual Activity   Alcohol use: Yes    Alcohol/week: 1.0 - 4.0 standard drink of alcohol    Types: 1 - 4 Glasses of wine per week    Comment: occ   Drug use: Never   Sexual activity: Not on file  Other Topics Concern   Not on file  Social History Narrative   Lives at  home with husband   2-3 caffeine drinks daily   Patient is right handed.         Social Determinants of Health   Financial Resource Strain: Not on file  Food Insecurity: Not on file  Transportation Needs: Not on file  Physical Activity: Sufficiently Active (04/15/2023)   Exercise Vital Sign    Days of Exercise per Week: 4 days    Minutes of Exercise per Session: 60 min  Stress: No Stress Concern Present (04/15/2023)   [employer] of Occupational Health - Occupational Stress Questionnaire    Feeling of Stress : Not at all  Social Connections: Unknown (04/15/2023)   Social Connection and Isolation Panel [NHANES]    Frequency of Communication with Friends and Family: Patient declined    Frequency of Social Gatherings with Friends and Family: Patient declined    Attends Religious Services: Patient declined    Database administrator or Organizations: Yes    Attends Engineer, structural: More than 4 times per year    Marital Status: Married    Outpatient Medications Prior to Visit  Medication Sig Dispense Refill   NON FORMULARY Compound Rx contain amino acid for osteoporosis.     Wheat Dextrin (BENEFIBER DRINK MIX PO) Take by mouth in the morning and at bedtime. 3 teaspoons two times  daily     No facility-administered medications prior to visit.     EXAM:  BP 114/84 (BP Location: Right Arm, Patient Position: Sitting, Cuff Size: Normal)   Pulse 81   Temp 98.1 F (36.7 C) (Oral)   Ht 5' 6.75" (1.695 m)   Wt 148 lb 6.4 oz (67.3 kg)   SpO2 96%   BMI 23.42 kg/m   Body mass index is 23.42 kg/m. Wt Readings from Last 3 Encounters:  04/15/23 148 lb 6.4 oz (67.3 kg)  02/10/23 148 lb (67.1 kg)  09/29/22 148 lb (67.1 kg)    Physical Exam: Vital signs reviewed OZH:[id number] is a well-developed well-nourished alert cooperative    who appearsr stated age in no acute distress.  HEENT: normocephalic atraumatic , Eyes: PERRL EOM's full, conjunctiva clear, Nares:  paten,t no deformity discharge or tenderness., Ears: no deformity EAC's clear TMs with normal landmarks. Mouth: clear OP, no lesions, edema.  Moist mucous membranes. Dentition in adequate repair. NECK: supple without  masses, thyromegaly or bruits. CHEST/PULM:  Clear to auscultation and percussion breath sounds equal no wheeze , rales or rhonchi. No chest wall deformities or tenderness. Breast: normal by inspection . No dimpling, discharge, masses, tenderness or discharge . CV: PMI is nondisplaced, S1 S2 no gallops, murmurs, rubs. Peripheral pulses are full without delay.No JVD .  ABDOMEN: Bowel sounds normal nontender  No guard or rebound, no hepato splenomegal no CVA tenderness.  No hernia. Extremtities:  No clubbing cyanosis or edema, no acute joint swelling or redness no focal atrophy NEURO:  Oriented x3, cranial nerves 3-12 appear to be intact, no obvious focal weakness,gait within normal limits no abnormal reflexes or asymmetrical SKIN: No acute rashes normal turgor, color, no bruising or petechiae. PSYCH: Oriented, good eye contact, no obvious depression anxiety, cognition and judgment appear normal. LN: no cervical axillary denopathy  Lab Results  Component Value Date   WBC 6.0 04/15/2023   HGB 13.0 04/15/2023   HCT 41.8 04/15/2023   PLT 232.0 04/15/2023   GLUCOSE 99 04/15/2023   CHOL 239 (H) 04/15/2023   TRIG 86.0 04/15/2023   HDL 78.90 04/15/2023   LDLDIRECT 123.1 03/02/2011   LDLCALC 143 (H) 04/15/2023   ALT 14 04/15/2023   AST 15 04/15/2023   NA 136 04/15/2023   K 4.1 04/15/2023   CL 101 04/15/2023   CREATININE 0.71 04/15/2023   BUN 17 04/15/2023   CO2 29 04/15/2023   TSH 1.66 04/15/2023   HGBA1C 5.5 04/15/2023    BP Readings from Last 3 Encounters:  04/15/23 114/84  02/10/23 90/60  09/24/22 116/80    Lab plan  reviewed with patient   ASSESSMENT AND PLAN:  Discussed the following assessment and plan:    ICD-10-CM   1. Visit for preventive health examination   Z00.00     2. Shortness of breath  R06.02 DG Chest 2 View    3. Other fatigue  R53.83 Basic metabolic panel    CBC with Differential/Platelet    Hemoglobin A1c    Hepatic function panel    Lipid panel    TSH    4. Family history of pancreatic cancer  Z80.0 Basic metabolic panel    CBC with Differential/Platelet    Hemoglobin A1c    Hepatic function panel    Lipid panel    TSH   see preovious genetic counseling  consult    5. Paresthesia of left leg  R20.2 Basic metabolic panel    CBC with Differential/Platelet    Hemoglobin A1c    Hepatic function panel    Lipid panel    TSH    6. Cough, persistent  R05.3 Basic metabolic panel    CBC with Differential/Platelet    Hemoglobin A1c    Hepatic function panel    Lipid panel    TSH    DG Chest 2 View    7. Osteoporosis without current pathological fracture, unspecified osteoporosis type  M81.0 Basic metabolic panel    CBC with Differential/Platelet    Hemoglobin A1c    Hepatic function panel    Lipid panel    TSH   non pharmacologic  methods followed  by endocrine stable dexa    8. Medication management  Z79.899 Basic metabolic panel    CBC with Differential/Platelet    Hemoglobin A1c    Hepatic function panel    Lipid panel    TSH    9. OTHER THALASSEMIA  D56.8     Last vitamin D Lab Results  Component Value Date   VD25OH 42.19 09/24/2022   Over all normal exam Breathing change since covid but not effecting exercise tolerance ?( But quite physically active)  Seems to be related to post covid  and has some upper airway mild sx .  Doubt cardiac cause.  But consider  further eval  Labs today and get c xray  consider pft s pulm eval  and optimize possible  ELR and try pepcid  2 x per day for a month to see if  improves  sx .   Return for depending on results and how doing .  Patient Care Team: Anoushka Divito, Neta Mends, MD as PCP - General Shea Evans, MD (Obstetrics and Gynecology) Toni Arthurs, MD as Consulting  Physician (Orthopedic Surgery) Napoleon Form, MD as Consulting Physician (Gastroenterology) Patient Instructions  Fasting lab today . Plan chest x ray at [facility]  lab x ray  any time open.   Try pepcid or famotidine otc twice a day for a month to see if  helps the  throat   cough clearing sx.   Let me know  in a montn.  I will also  consider getting lung function tests  .  Exam is good today .    Neta Mends. Nishka Heide M.D.

## 2023-04-15 ENCOUNTER — Ambulatory Visit: Payer: Medicare HMO | Admitting: Internal Medicine

## 2023-04-15 ENCOUNTER — Encounter: Payer: Self-pay | Admitting: Internal Medicine

## 2023-04-15 VITALS — BP 114/84 | HR 81 | Temp 98.1°F | Ht 66.75 in | Wt 148.4 lb

## 2023-04-15 DIAGNOSIS — R0602 Shortness of breath: Secondary | ICD-10-CM

## 2023-04-15 DIAGNOSIS — Z0001 Encounter for general adult medical examination with abnormal findings: Secondary | ICD-10-CM | POA: Diagnosis not present

## 2023-04-15 DIAGNOSIS — D568 Other thalassemias: Secondary | ICD-10-CM

## 2023-04-15 DIAGNOSIS — R202 Paresthesia of skin: Secondary | ICD-10-CM

## 2023-04-15 DIAGNOSIS — Z0000 Encounter for general adult medical examination without abnormal findings: Secondary | ICD-10-CM

## 2023-04-15 DIAGNOSIS — M810 Age-related osteoporosis without current pathological fracture: Secondary | ICD-10-CM | POA: Diagnosis not present

## 2023-04-15 DIAGNOSIS — Z79899 Other long term (current) drug therapy: Secondary | ICD-10-CM | POA: Diagnosis not present

## 2023-04-15 DIAGNOSIS — R5383 Other fatigue: Secondary | ICD-10-CM | POA: Diagnosis not present

## 2023-04-15 DIAGNOSIS — Z800 Family history of malignant neoplasm of digestive organs: Secondary | ICD-10-CM | POA: Diagnosis not present

## 2023-04-15 DIAGNOSIS — R053 Chronic cough: Secondary | ICD-10-CM | POA: Diagnosis not present

## 2023-04-15 LAB — CBC WITH DIFFERENTIAL/PLATELET
Basophils Absolute: 0 10*3/uL (ref 0.0–0.1)
Basophils Relative: 0.8 % (ref 0.0–3.0)
Eosinophils Absolute: 0.1 10*3/uL (ref 0.0–0.7)
Eosinophils Relative: 1.7 % (ref 0.0–5.0)
HCT: 41.8 % (ref 36.0–46.0)
Hemoglobin: 13 g/dL (ref 12.0–15.0)
Lymphocytes Relative: 24.9 % (ref 12.0–46.0)
Lymphs Abs: 1.5 10*3/uL (ref 0.7–4.0)
MCHC: 31.1 g/dL (ref 30.0–36.0)
MCV: 62.7 fL — ABNORMAL LOW (ref 78.0–100.0)
Monocytes Absolute: 0.5 10*3/uL (ref 0.1–1.0)
Monocytes Relative: 8.3 % (ref 3.0–12.0)
Neutro Abs: 3.8 10*3/uL (ref 1.4–7.7)
Neutrophils Relative %: 64.3 % (ref 43.0–77.0)
Platelets: 232 10*3/uL (ref 150.0–400.0)
RBC: 6.67 Mil/uL — ABNORMAL HIGH (ref 3.87–5.11)
RDW: 15.4 % (ref 11.5–15.5)
WBC: 6 10*3/uL (ref 4.0–10.5)

## 2023-04-15 LAB — TSH: TSH: 1.66 u[IU]/mL (ref 0.35–5.50)

## 2023-04-15 LAB — HEPATIC FUNCTION PANEL
ALT: 14 U/L (ref 0–35)
AST: 15 U/L (ref 0–37)
Albumin: 4.5 g/dL (ref 3.5–5.2)
Alkaline Phosphatase: 55 U/L (ref 39–117)
Bilirubin, Direct: 0.1 mg/dL (ref 0.0–0.3)
Total Bilirubin: 0.7 mg/dL (ref 0.2–1.2)
Total Protein: 7.3 g/dL (ref 6.0–8.3)

## 2023-04-15 LAB — HEMOGLOBIN A1C: Hgb A1c MFr Bld: 5.5 % (ref 4.6–6.5)

## 2023-04-15 LAB — BASIC METABOLIC PANEL
BUN: 17 mg/dL (ref 6–23)
CO2: 29 meq/L (ref 19–32)
Calcium: 10 mg/dL (ref 8.4–10.5)
Chloride: 101 meq/L (ref 96–112)
Creatinine, Ser: 0.71 mg/dL (ref 0.40–1.20)
GFR: 86.37 mL/min (ref >60.00)
Glucose, Bld: 99 mg/dL (ref 70–99)
Potassium: 4.1 meq/L (ref 3.5–5.1)
Sodium: 136 meq/L (ref 135–145)

## 2023-04-15 LAB — LIPID PANEL
Cholesterol: 239 mg/dL — ABNORMAL HIGH (ref 0–200)
HDL: 78.9 mg/dL (ref >39.00)
LDL Cholesterol: 143 mg/dL — ABNORMAL HIGH (ref 0–99)
NonHDL: 160.12
Total CHOL/HDL Ratio: 3
Triglycerides: 86 mg/dL (ref 0.0–149.0)
VLDL: 17.2 mg/dL (ref 0.0–40.0)

## 2023-04-15 NOTE — Patient Instructions (Addendum)
Fasting lab today . Plan chest x ray at [facility]  lab x ray  any time open.   Try pepcid or famotidine otc twice a day for a month to see if  helps the  throat   cough clearing sx.   Let me know  in a montn.  I will also  consider getting lung function tests  .  Exam is good today .

## 2023-04-19 ENCOUNTER — Other Ambulatory Visit: Payer: Self-pay | Admitting: Internal Medicine

## 2023-04-19 DIAGNOSIS — R0602 Shortness of breath: Secondary | ICD-10-CM

## 2023-04-19 NOTE — Progress Notes (Signed)
Order pfts

## 2023-04-21 ENCOUNTER — Ambulatory Visit
Admission: RE | Admit: 2023-04-21 | Discharge: 2023-04-21 | Disposition: A | Discharge status: Home / Self Care | Payer: Medicare HMO | Source: Ambulatory Visit | Attending: Internal Medicine | Admitting: Internal Medicine

## 2023-04-21 DIAGNOSIS — R053 Chronic cough: Secondary | ICD-10-CM | POA: Diagnosis not present

## 2023-04-21 DIAGNOSIS — R0602 Shortness of breath: Secondary | ICD-10-CM | POA: Diagnosis not present

## 2023-04-21 DIAGNOSIS — U071 COVID-19: Secondary | ICD-10-CM | POA: Diagnosis not present

## 2023-04-21 DIAGNOSIS — R059 Cough, unspecified: Secondary | ICD-10-CM | POA: Diagnosis not present

## 2023-06-03 ENCOUNTER — Encounter: Payer: Medicare HMO | Admitting: Physical Therapy

## 2023-06-09 NOTE — Therapy (Deleted)
OUTPATIENT PHYSICAL THERAPY FEMALE PELVIC EVALUATION   Patient Name: Heather Goodwin MRN: [id number] DOB:[birth year], 72 y.o., female Today's Date: 06/09/2023  END OF SESSION:   Past Medical History:  Diagnosis Date   Beta thalassemia trait    Cataract Early stage   Foot fracture    at gym   Osteoporosis    Peripheral neuropathy 04/01/2020   Past Surgical History:  Procedure Laterality Date   COLONOSCOPY  2006   normal -Dr.Brodie   COLONOSCOPY  2012   Dr.Brodie-hyperplastic polyps   FRACTURE SURGERY  Meniscus repair   left knee surgery  2009   meniscus    US ECHOCARDIOGRAPHY  2005   normal for syncope    Patient Active Problem List   Diagnosis Date Noted   Encounter for counseling 01/06/2022   Peripheral neuropathy 04/01/2020   Genetic testing 12/20/2018   Family history of breast cancer    Family history of melanoma    Paresthesia of left leg 02/13/2015   Osteopenia 10/06/2013   Visit for preventive health examination 10/06/2013   Fatigue 03/02/2011   Family history of pancreatic cancer 03/02/2011   OTHER DYSPHAGIA 12/31/2009   OTHER THALASSEMIA 09/23/2007    PCP: Madelin Headings, MD  REFERRING PROVIDER: Napoleon Form, MD   REFERRING DIAG: R15.9 (ICD-10-CM) - Incontinence of feces, unspecified fecal incontinence type   THERAPY DIAG:  No diagnosis found.  Rationale for Evaluation and Treatment: Rehabilitation  ONSET DATE: ***  SUBJECTIVE:                                                                                                                                                                                           SUBJECTIVE STATEMENT: 3 episodes of fecal incontinences in the past year;   the chances of having an episode when her stool is loose or with diarrhea is higher.  Fluid intake: {Yes/No:304960894}   PAIN:  Are you having pain? {yes/no:20286} NPRS scale: ***/10 Pain location: {pelvic pain location:27098}  Pain type:  {type:313116} Pain description: {PAIN DESCRIPTION:21022940}   Aggravating factors: *** Relieving factors: ***  PRECAUTIONS: {Therapy precautions:24002}  RED FLAGS: {PT Red Flags:29287}   WEIGHT BEARING RESTRICTIONS: {Yes ***/No:24003}  FALLS:  Has patient fallen in last 6 months? {fallsyesno:27318}  LIVING ENVIRONMENT: Lives with: {[facility] lives with:25569::"lives with their family"} Lives in: {Lives in:25570} Stairs: {opstairs:27293} Has following equipment at home: {Assistive devices:23999}  OCCUPATION: ***  PLOF: {PLOF:24004}  PATIENT GOALS: ***  PERTINENT HISTORY:  Osteoporosis;  Sexual abuse: {Yes/No:304960894}  BOWEL MOVEMENT: Pain with bowel movement: {yes/no:20286} Type of bowel movement:{PT BM type:27100} Fully empty rectum: {Yes/No:304960894} Leakage: {Yes/No:304960894} Pads: {Yes/No:304960894} Fiber  supplement: {Yes/No:304960894}  URINATION: Pain with urination: {yes/no:20286} Fully empty bladder: {Yes/No:304960894} Stream: {PT urination:27102} Urgency: {Yes/No:304960894} Frequency: *** Leakage: {PT leakage:27103} Pads: {Yes/No:304960894}  INTERCOURSE: Pain with intercourse: {pain with intercourse PA:27099} Ability to have vaginal penetration:  {Yes/No:304960894} Climax: *** Marinoff Scale: ***/3  PREGNANCY: Vaginal deliveries *** Tearing {Yes***/No:304960894} C-section deliveries *** Currently pregnant {Yes***/No:304960894}  PROLAPSE: {PT prolapse:27101}   OBJECTIVE:  Note: Objective measures were completed at Evaluation unless otherwise noted.  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   PATIENT SURVEYS:  {rehab surveys:24030}  PFIQ-7 ***  COGNITION: Overall cognitive status: {cognition:24006}     SENSATION: Light touch: {intact/deficits:24005} Proprioception: {intact/deficits:24005}  MUSCLE LENGTH: Hamstrings: Right *** deg; Left *** deg Thomas test: Right *** deg; Left  *** deg  LUMBAR SPECIAL TESTS:  {lumbar special test:25242}  FUNCTIONAL TESTS:  {Functional tests:24029}  GAIT: Distance walked: *** Assistive device utilized: {Assistive devices:23999} Level of assistance: {Levels of assistance:24026} Comments: ***  POSTURE: {posture:25561}  PELVIC ALIGNMENT:  LUMBARAROM/PROM:  A/PROM A/PROM  eval  Flexion   Extension   Right lateral flexion   Left lateral flexion   Right rotation   Left rotation    (Blank rows = not tested)  LOWER EXTREMITY ROM:  {AROM/PROM:27142} ROM Right eval Left eval  Hip flexion    Hip extension    Hip abduction    Hip adduction    Hip internal rotation    Hip external rotation    Knee flexion    Knee extension    Ankle dorsiflexion    Ankle plantarflexion    Ankle inversion    Ankle eversion     (Blank rows = not tested)  LOWER EXTREMITY MMT:  MMT Right eval Left eval  Hip flexion    Hip extension    Hip abduction    Hip adduction    Hip internal rotation    Hip external rotation    Knee flexion    Knee extension    Ankle dorsiflexion    Ankle plantarflexion    Ankle inversion    Ankle eversion     PALPATION:   General  ***                External Perineal Exam ***                             Internal Pelvic Floor ***  Patient confirms identification and approves PT to assess internal pelvic floor and treatment {yes/no:20286}  PELVIC MMT:   MMT eval  Vaginal   Internal Anal Sphincter   External Anal Sphincter   Puborectalis   Diastasis Recti   (Blank rows = not tested)        TONE: ***  PROLAPSE: ***  TODAY'S TREATMENT:  DATE: ***  EVAL ***   PATIENT EDUCATION:  Education details: *** Person educated: {Person educated:25204} Education method: {Education Method:25205} Education comprehension: {Education Comprehension:25206}  HOME  EXERCISE PROGRAM: ***  ASSESSMENT:  CLINICAL IMPRESSION: Patient is a *** y.o. *** who was seen today for physical therapy evaluation and treatment for ***.   OBJECTIVE IMPAIRMENTS: {opptimpairments:25111}.   ACTIVITY LIMITATIONS: {activitylimitations:27494}  PARTICIPATION LIMITATIONS: {participationrestrictions:25113}  PERSONAL FACTORS: {Personal factors:25162} are also affecting patient's functional outcome.   REHAB POTENTIAL: {rehabpotential:25112}  CLINICAL DECISION MAKING: {clinical decision making:25114}  EVALUATION COMPLEXITY: {Evaluation complexity:25115}   GOALS: Goals reviewed with patient? {yes/no:20286}  SHORT TERM GOALS: Target date: ***  *** Baseline: Goal status: INITIAL  2.  *** Baseline:  Goal status: INITIAL  3.  *** Baseline:  Goal status: INITIAL  4.  *** Baseline:  Goal status: INITIAL  5.  *** Baseline:  Goal status: INITIAL  6.  *** Baseline:  Goal status: INITIAL  LONG TERM GOALS: Target date: ***  *** Baseline:  Goal status: INITIAL  2.  *** Baseline:  Goal status: INITIAL  3.  *** Baseline:  Goal status: INITIAL  4.  *** Baseline:  Goal status: INITIAL  5.  *** Baseline:  Goal status: INITIAL  6.  *** Baseline:  Goal status: INITIAL  PLAN:  PT FREQUENCY: {rehab frequency:25116}  PT DURATION: {rehab duration:25117}  PLANNED INTERVENTIONS: {rehab planned interventions:25118::"97110-Therapeutic exercises","97530- Therapeutic 229 005 1343- Neuromuscular re-education","97535- Self JXBJ","47829- Manual therapy"}  PLAN FOR NEXT SESSION: ***   Olliver Boyadjian, PT 06/09/2023, 8:26 AM

## 2023-06-10 ENCOUNTER — Encounter: Payer: Medicare HMO | Admitting: Physical Therapy

## 2023-06-14 ENCOUNTER — Ambulatory Visit: Payer: Medicare HMO | Admitting: Family Medicine

## 2023-06-24 ENCOUNTER — Ambulatory Visit (INDEPENDENT_AMBULATORY_CARE_PROVIDER_SITE_OTHER): Payer: Medicare HMO | Admitting: Internal Medicine

## 2023-06-24 ENCOUNTER — Encounter: Payer: Self-pay | Admitting: Internal Medicine

## 2023-06-24 VITALS — BP 100/78 | HR 87 | Temp 97.8°F | Ht 66.75 in | Wt 152.2 lb

## 2023-06-24 DIAGNOSIS — R5383 Other fatigue: Secondary | ICD-10-CM

## 2023-06-24 DIAGNOSIS — R0602 Shortness of breath: Secondary | ICD-10-CM | POA: Diagnosis not present

## 2023-06-24 DIAGNOSIS — R053 Chronic cough: Secondary | ICD-10-CM | POA: Diagnosis not present

## 2023-06-24 DIAGNOSIS — H9190 Unspecified hearing loss, unspecified ear: Secondary | ICD-10-CM

## 2023-06-24 NOTE — Progress Notes (Signed)
 Chief Complaint  Patient presents with   Cough    Pt reports she has been coughing for over 2 wks. Dry cough. Pt reports SOB. Denied wheezing. Not taking any medicine for it55. Denied the following dk:qzczm, bodyache, chills, headache and sore throat. Yes to fatigue.     HPI: Sierah J Ran 73 y.o. come in for  tired  persistent cough again  began a few weeks ago with fatigue achy no fever and cough that is better but persistent   SOB  not new but ? Like hard to breath at times  Not getting  better  little  dry cough  minimal clear stugg.ocass phlegm.   No pnd did have left cheek red and swollen  better after benadryl  hx of recnet facial injections Because of holiday and cough has not been to gyme or reg exercise as usual but does osteo strong and feels has lost some strength ability  ROS: See pertinent positives and negatives per HPI. No unintended weight loss hearing may be some worse  had hearing check a few years ago  reluctant to use hearing aid over ear but may need reeval . No vomiting but does describe that something caught in throat around upper chest  ssnothch area  but no  water br  Immunization History  Administered Date(s) Administered   PFIZER(Purple Top)SARS-COV-2 Vaccination 08/26/2019, 09/26/2019   Td 06/23/2003   Tdap 10/06/2013   Zoster Recombinant(Shingrix ) 02/09/2018   Zoster, Live 10/06/2013    Health Maintenance  Topic Date Due   Medicare Annual Wellness (AWV)  Never done   COVID-19 Vaccine (3 - Pfizer risk series) 10/24/2019   INFLUENZA VACCINE  09/20/2023 (Originally 01/21/2023)   Zoster Vaccines- Shingrix  (2 of 2) 10/14/2023 (Originally 04/06/2018)   Pneumonia Vaccine 48+ Years old (1 of 1 - PCV) 04/14/2024 (Originally 03/27/2018)   DTaP/Tdap/Td (3 - Td or Tdap) 10/07/2023   MAMMOGRAM  09/14/2024   Colonoscopy  10/09/2024   DEXA SCAN  Completed   HPV VACCINES  Aged Out   Hepatitis C Screening  Discontinued   or true dysphagia  Had been to try   pepcif and zyrtec  from dr Na but dodnt really follow through  with this .   Past Medical History:  Diagnosis Date   Beta thalassemia trait    Cataract Early stage   Foot fracture    at gym   Osteoporosis    Peripheral neuropathy 04/01/2020    Family History  Problem Relation Age of Onset   Thyroid  disease Mother        partial thyroidectomy   Pancreatic cancer Mother 47   Cirrhosis Father        secondary to chronic hepatitis b infection   Other Son        B Thal trait   Breast cancer Paternal Aunt 85   Breast cancer Maternal Aunt 45   Pancreatic cancer Maternal Grandfather        dx 75-80   Melanoma Brother 45   Colon cancer Neg Hx    Esophageal cancer Neg Hx    Rectal cancer Neg Hx     Social History   Socioeconomic History   Marital status: Married    Spouse name: Not on file   Number of children: 3   Years of education: Not on file   Highest education level: Not on file  Occupational History   Occupation: Real Estate   Occupation: Self-employed  Tobacco Use   Smoking  status: Never   Smokeless tobacco: Never  Vaping Use   Vaping status: Never Used  Substance and Sexual Activity   Alcohol use: Yes    Alcohol/week: 1.0 - 4.0 standard drink of alcohol    Types: 1 - 4 Glasses of wine per week    Comment: occ   Drug use: Never   Sexual activity: Not on file  Other Topics Concern   Not on file  Social History Narrative   Lives at home with husband   2-3 caffeine drinks daily   Patient is right handed.         Social Drivers of Corporate Investment Banker Strain: Not on file  Food Insecurity: Not on file  Transportation Needs: Not on file  Physical Activity: Sufficiently Active (04/15/2023)   Exercise Vital Sign    Days of Exercise per Week: 4 days    Minutes of Exercise per Session: 60 min  Stress: No Stress Concern Present (04/15/2023)   [employer] of Occupational Health - Occupational Stress Questionnaire    Feeling of Stress : Not at  all  Social Connections: Unknown (04/15/2023)   Social Connection and Isolation Panel [NHANES]    Frequency of Communication with Friends and Family: Patient declined    Frequency of Social Gatherings with Friends and Family: Patient declined    Attends Religious Services: Patient declined    Database Administrator or Organizations: Yes    Attends Engineer, Structural: More than 4 times per year    Marital Status: Married    Outpatient Medications Prior to Visit  Medication Sig Dispense Refill   NON FORMULARY Compound Rx contain amino acid for osteoporosis.     Wheat Dextrin (BENEFIBER DRINK MIX PO) Take by mouth in the morning and at bedtime. 3 teaspoons two times  daily     No facility-administered medications prior to visit.     EXAM:  BP 100/78 (BP Location: Right Arm, Patient Position: Sitting, Cuff Size: Normal)   Pulse 87   Temp 97.8 F (36.6 C) (Oral)   Ht 5' 6.75 (1.695 m)   Wt 152 lb 3.2 oz (69 kg)   SpO2 98%   BMI 24.02 kg/m   Body mass index is 24.02 kg/m.  GENERAL: vitals reviewed and listed above, alert, oriented, appears well hydrated and in no acute distress mildly hard of hearing   HEENT: atraumatic, conjunctiva  clear, no obvious abnormalities on inspection of external nose and ears tmx clear  OP : no lesion edema or exudate  NECK: no obvious masses on inspection palpation  LUNGS: clear to auscultation bilaterally, no wheezes, rales or rhonchi, good air movement CV: HRRR, no clubbing cyanosis or  peripheral edema nl cap refill  MS: moves all extremities without noticeable focal  abnormality PSYCH: pleasant and cooperative, no obvious depression or anxiety Lab Results  Component Value Date   WBC 6.0 04/15/2023   HGB 13.0 04/15/2023   HCT 41.8 04/15/2023   PLT 232.0 04/15/2023   GLUCOSE 99 04/15/2023   CHOL 239 (H) 04/15/2023   TRIG 86.0 04/15/2023   HDL 78.90 04/15/2023   LDLDIRECT 123.1 03/02/2011   LDLCALC 143 (H) 04/15/2023   ALT 14  04/15/2023   AST 15 04/15/2023   NA 136 04/15/2023   K 4.1 04/15/2023   CL 101 04/15/2023   CREATININE 0.71 04/15/2023   BUN 17 04/15/2023   CO2 29 04/15/2023   TSH 1.66 04/15/2023   HGBA1C 5.5 04/15/2023  BP Readings from Last 3 Encounters:  06/24/23 100/78  04/15/23 114/84  02/10/23 90/60  Record review   ASSESSMENT AND PLAN:  Discussed the following assessment and plan:  Cough, persistent - Plan: CT Chest Wo Contrast  Shortness of breath - Plan: CT Chest Wo Contrast  Other fatigue  Decreased hearing, unspecified laterality  Recent cough sounds like trigger viral but not resolving  and has had persistent cough sounds upper chest  off and on for a while. The SOB feeling doesn't change her activity pattern  at this time but undefined .   Some of sx sound esophageal poss and or  upper airway?   Ocass swallowing  issue  Ordering chest ct r ( last recent  x ray ok )  hx of covid x2 last over a year ago.   Consider evaluate for silent reflux sx  Fortunately no other alarm findings  but  advise fu. Because of multiplex of sx .  Will have her take the pepcid bid for a month  in interim and want to have her go back to dr N GI to assess need for more eval esophageal .   She will make appt with ENT audiology about hearing eval update. And perhaps  upper airway cough sx ?   No hx of reactive allergy and no obv bacterial complication  -Patient advised to return or notify health care team  if  new concerns arise.  Patient Instructions  Chest is  clear  today.   Plan chest ct plain to make sure not missing lung disease. Then get on the  acid blocker   every day for 3-4 weeks at least.  Then I  want you to follow up with Dr LOISE berke consider  esophageal causes .  You should be notified of  scan appt.   Ember Henrikson K. Branch Pacitti M.D.

## 2023-06-24 NOTE — Patient Instructions (Addendum)
 Chest is  clear  today.   Plan chest ct plain to make sure not missing lung disease. Then get on the  acid blocker   every day for 3-4 weeks at least.  Then I  want you to follow up with Dr LOISE berke consider  esophageal causes .  You should be notified of  scan appt.

## 2023-06-28 ENCOUNTER — Other Ambulatory Visit: Payer: Medicare HMO

## 2023-06-29 ENCOUNTER — Ambulatory Visit
Admission: RE | Admit: 2023-06-29 | Discharge: 2023-06-29 | Disposition: A | Discharge status: Home / Self Care | Payer: Medicare HMO | Source: Ambulatory Visit | Attending: Internal Medicine | Admitting: Internal Medicine

## 2023-06-29 DIAGNOSIS — R0600 Dyspnea, unspecified: Secondary | ICD-10-CM | POA: Diagnosis not present

## 2023-06-29 DIAGNOSIS — R0602 Shortness of breath: Secondary | ICD-10-CM

## 2023-06-29 DIAGNOSIS — R053 Chronic cough: Secondary | ICD-10-CM

## 2023-06-30 NOTE — Therapy (Signed)
 OUTPATIENT PHYSICAL THERAPY FEMALE PELVIC EVALUATION   Patient Name: Heather Goodwin MRN: [id number] DOB:03-Jul-1950, 73 y.o., female Today's Date: 07/01/2023  END OF SESSION:  PT End of Session - 07/01/23 1143     Visit Number 1    Date for PT Re-Evaluation 08/26/23    Authorization Type [employer] Medicare    Authorization - Visit Number 1    Progress Note Due on Visit 10    PT Start Time 1136    PT Stop Time 1220    PT Time Calculation (min) 44 min    Activity Tolerance Patient tolerated treatment well    Behavior During Therapy [facility] for tasks assessed/performed             Past Medical History:  Diagnosis Date   Beta thalassemia trait    Cataract Early stage   Foot fracture    at gym   Osteoporosis    Peripheral neuropathy 04/01/2020   Past Surgical History:  Procedure Laterality Date   COLONOSCOPY  2006   normal -Dr.Brodie   COLONOSCOPY  2012   Dr.Brodie-hyperplastic polyps   FRACTURE SURGERY  Meniscus repair   left knee surgery  2009   meniscus    US  ECHOCARDIOGRAPHY  2005   normal for syncope    Patient Active Problem List   Diagnosis Date Noted   Encounter for counseling 01/06/2022   Peripheral neuropathy 04/01/2020   Genetic testing 12/20/2018   Family history of breast cancer    Family history of melanoma    Paresthesia of left leg 02/13/2015   Osteopenia 10/06/2013   Visit for preventive health examination 10/06/2013   Fatigue 03/02/2011   Family history of pancreatic cancer 03/02/2011   OTHER DYSPHAGIA 12/31/2009   OTHER THALASSEMIA 09/23/2007    PCP: Charlett Apolinar POUR, MD  REFERRING PROVIDER: Shila Gustav GAILS, MD   REFERRING DIAG: R15.9 (ICD-10-CM) - Incontinence of feces, unspecified fecal incontinence type   THERAPY DIAG:  Muscle weakness (generalized)  Unspecified lack of coordination  Rationale for Evaluation and Treatment: Rehabilitation  ONSET DATE: 2019  SUBJECTIVE:                                                                                                                                                                                            SUBJECTIVE STATEMENT: Patient has a hard time to hold her stool when she gets the urge. She has had a fecal leakage 11/2012. She gets a strong urge to have a bowel movement and must get to the bathroom and is able to make it to the bathroom. No back issues.   PAIN:  Are  you having pain? No  PRECAUTIONS: None  RED FLAGS: None   WEIGHT BEARING RESTRICTIONS: No  FALLS:  Has patient fallen in last 6 months?  Declines to answer. She does not feel her balance is an issue   OCCUPATION: retired  PLOF: Independent  PATIENT GOALS: reduce the strong urge to have a bowel  movement  PERTINENT HISTORY:  osteoporosis  BOWEL MOVEMENT: Pain with bowel movement: No Type of bowel movement:Type (Bristol Stool Scale) soft Type 4, has to wipe often, Frequency daily 2x, Strain No, and Splinting yes Fully empty rectum: Yes:   Leakage: Yes: 1 time in 11/2022 Pads: No Fiber supplement: Yes: Benfiber 3 tsp.   URINATION: Pain with urination: No Leakage:  none   PREGNANCY: Vaginal deliveries 3 Tearing Yes: not sure how much  PROLAPSE: None   OBJECTIVE:  Note: Objective measures were completed at Evaluation unless otherwise noted.  DIAGNOSTIC FINDINGS:  none   COGNITION: Overall cognitive status: Within functional limits for tasks assessed     SENSATION: Light touch: Appears intact Proprioception: Appears intact   POSTURE: No Significant postural limitations  PELVIC ALIGNMENT: correct alignment  LUMBARAROM/PROM:Lumbar ROM is full  LOWER EXTREMITY ROM: Bilateral hip ROM is full   LOWER EXTREMITY MMT: Bilateral hip strength is 5/5   PALPATION:   General  Able to contract her abdomen correctly                External Perineal Exam firmness along the perineal body                             Internal Pelvic Floor decreased movement of the  puborectalis, some difficulty with contracting the lower abdominals with pelvic floor contraction  Patient confirms identification and approves PT to assess internal pelvic floor and treatment Yes  PELVIC MMT:   MMT eval  Internal Anal Sphincter 3/5 hold 22 sec 4x  External Anal Sphincter 3/5 hold 22 sec 4x  Puborectalis 2/5 22 sec 4x  (Blank rows = not tested)        TONE: average    TODAY'S TREATMENT:                                                                                                                              DATE: 07/01/23  EVAL See below   PATIENT EDUCATION:  07/01/23 Education details: Access Code: [email or web address] Person educated: Patient Education method: Explanation, Demonstration, Tactile cues, Verbal cues, and Handouts Education comprehension: verbalized understanding, returned demonstration, verbal cues required, tactile cues required, and needs further education  HOME EXERCISE PROGRAM: 07/01/23 Access Code: [email or web address] URL: [email or web address] Date: 07/01/2023 Prepared by: Channing Pereyra  Exercises - Seated Pelvic Floor Contraction  - 3 x daily - 7 x weekly - 1 sets - 5 reps - 25 hold - Seated Quick Flick Pelvic Floor Contractions  - 3 x daily - 7 x weekly - 3 sets -  5 reps  ASSESSMENT:  CLINICAL IMPRESSION: Patient is a 73 y.o. female who was seen today for physical therapy evaluation and treatment for fecal incontinence. Patient has had issues with fecal leakage for 6 years. Her last episode of fecal leakage was 6/24. She presently gets a very strong urge making her have to go to the bathroom immediately. She has been able to hold her stool till she sits on the commode the past few months. External and internal sphincter strength is 3/5 holding 22 seconds. Puborectalis strength is 2/5 holding 22 seconds. She has some difficulty feeling her lower abdominals contraction with pelvic floor contraction. Patient will benefit from skilled therapy to work  on pelvic  floor endurance and reduction of urgency.   OBJECTIVE IMPAIRMENTS: decreased coordination and decreased strength.   ACTIVITY LIMITATIONS: continence  PARTICIPATION LIMITATIONS: community activity  PERSONAL FACTORS: Time since onset of injury/illness/exacerbation are also affecting patient's functional outcome.   REHAB POTENTIAL: Excellent  CLINICAL DECISION MAKING: Stable/uncomplicated  EVALUATION COMPLEXITY: Low   GOALS: Goals reviewed with patient? Yes  SHORT TERM GOALS: Target date: 07/29/23  Patient independent with initial HEP. Baseline: Goal status: INITIAL  2.  Patient is able to bring the puborectalis forward and strength increased to 3/5.  Baseline:  Goal status: INITIAL  3.  Patient educated on the urge to void technique for when she gets the urge to have a bowel movement.  Baseline:  Goal status: INITIAL   LONG TERM GOALS: Target date: 08/26/23  Patient independent with advanced HEP for core and pelvic floor to reduce urgency.  Baseline:  Goal status: INITIAL  2.  Patient is able to delay the urge to have a bowel movement for 10 minutes so she is able to slowly walk to the bathroom.  Baseline:  Goal status: INITIAL  3.  Patient is able to contract her the rectum >/= 3/5 for 40 seconds for continence 10 x.  Baseline:  Goal status: INITIAL  4.  Patient reports her urgency for a bowel movement has decreased >/= 60%.  Baseline:  Goal status: INITIAL   PLAN:  PT FREQUENCY: 1x/week  PT DURATION: 8 weeks  PLANNED INTERVENTIONS: 97110-Therapeutic exercises, 97530- Therapeutic activity, 97112- Neuromuscular re-education, 97140- Manual therapy, 97014- Electrical stimulation (unattended), [id number]- Electrical stimulation (manual), Patient/Family education, and Biofeedback  PLAN FOR NEXT SESSION: RUSI; manual work to the puborectalis to come forward, elevators, go over urgency  Channing Pereyra, PT 07/01/23 12:59 PM

## 2023-07-01 ENCOUNTER — Other Ambulatory Visit: Payer: Self-pay

## 2023-07-01 ENCOUNTER — Encounter: Payer: Medicare HMO | Attending: Gastroenterology | Admitting: Physical Therapy

## 2023-07-01 ENCOUNTER — Encounter: Payer: Self-pay | Admitting: Physical Therapy

## 2023-07-01 DIAGNOSIS — R279 Unspecified lack of coordination: Secondary | ICD-10-CM | POA: Diagnosis not present

## 2023-07-01 DIAGNOSIS — M6281 Muscle weakness (generalized): Secondary | ICD-10-CM | POA: Insufficient documentation

## 2023-07-08 ENCOUNTER — Encounter: Payer: Medicare HMO | Admitting: Physical Therapy

## 2023-07-09 ENCOUNTER — Encounter: Payer: Self-pay | Admitting: Physical Therapy

## 2023-07-09 ENCOUNTER — Ambulatory Visit: Payer: Medicare HMO | Attending: Gastroenterology | Admitting: Physical Therapy

## 2023-07-09 DIAGNOSIS — R293 Abnormal posture: Secondary | ICD-10-CM | POA: Diagnosis not present

## 2023-07-09 DIAGNOSIS — M6281 Muscle weakness (generalized): Secondary | ICD-10-CM | POA: Insufficient documentation

## 2023-07-09 DIAGNOSIS — R279 Unspecified lack of coordination: Secondary | ICD-10-CM | POA: Diagnosis not present

## 2023-07-09 NOTE — Therapy (Signed)
OUTPATIENT PHYSICAL THERAPY FEMALE PELVIC TREATMENT   Patient Name: Heather Goodwin MRN: [id number] DOB:May 24, 1951, 73 y.o., female Today's Date: 07/09/2023  END OF SESSION:  PT End of Session - 07/09/23 0935     Visit Number 2    Date for PT Re-Evaluation 08/26/23    Authorization Type [employer]    Authorization - Visit Number 2    Progress Note Due on Visit 10    PT Start Time 0930    PT Stop Time 1010    PT Time Calculation (min) 40 min    Activity Tolerance Patient tolerated treatment well    Behavior During Therapy [facility] for tasks assessed/performed             Past Medical History:  Diagnosis Date   Beta thalassemia trait    Cataract Early stage   Foot fracture    at gym   Osteoporosis    Peripheral neuropathy 04/01/2020   Past Surgical History:  Procedure Laterality Date   COLONOSCOPY  2006   normal -Dr.Brodie   COLONOSCOPY  2012   Dr.Brodie-hyperplastic polyps   FRACTURE SURGERY  Meniscus repair   left knee surgery  2009   meniscus    US ECHOCARDIOGRAPHY  2005   normal for syncope    Patient Active Problem List   Diagnosis Date Noted   Encounter for counseling 01/06/2022   Peripheral neuropathy 04/01/2020   Genetic testing 12/20/2018   Family history of breast cancer    Family history of melanoma    Paresthesia of left leg 02/13/2015   Osteopenia 10/06/2013   Visit for preventive health examination 10/06/2013   Fatigue 03/02/2011   Family history of pancreatic cancer 03/02/2011   OTHER DYSPHAGIA 12/31/2009   OTHER THALASSEMIA 09/23/2007    PCP: Madelin Headings, MD  REFERRING PROVIDER: Napoleon Form, MD   REFERRING DIAG: R15.9 (ICD-10-CM) - Incontinence of feces, unspecified fecal incontinence type   THERAPY DIAG:  Muscle weakness (generalized)  Unspecified lack of coordination  Abnormal posture  Rationale for Evaluation and Treatment: Rehabilitation  ONSET DATE: 2019  SUBJECTIVE:                                                                                                                                                                                            SUBJECTIVE STATEMENT: Patient has a hard time to hold her stool when she gets the urge. She has had a fecal leakage 11/2012. She gets a strong urge to have a bowel movement and must get to the bathroom and is able to make it to the bathroom. No back issues.  PAIN:  Are you having pain? No  PRECAUTIONS: None  RED FLAGS: None   WEIGHT BEARING RESTRICTIONS: No  FALLS:  Has patient fallen in last 6 months?  Declines to answer. She does not feel her balance is an issue   OCCUPATION: retired  PLOF: Independent  PATIENT GOALS: reduce the strong urge to have a bowel  movement  PERTINENT HISTORY:  osteoporosis  BOWEL MOVEMENT: Pain with bowel movement: No Type of bowel movement:Type (Bristol Stool Scale) soft Type 4, has to wipe often, Frequency daily 2x, Strain No, and Splinting yes Fully empty rectum: Yes:   Leakage: Yes: 1 time in 11/2022 Pads: No Fiber supplement: Yes: Benfiber 3 tsp.   URINATION: Pain with urination: No Leakage:  none   PREGNANCY: Vaginal deliveries 3 Tearing Yes: not sure how much  PROLAPSE: None   OBJECTIVE:  Note: Objective measures were completed at Evaluation unless otherwise noted.  DIAGNOSTIC FINDINGS:  none   COGNITION: Overall cognitive status: Within functional limits for tasks assessed     SENSATION: Light touch: Appears intact Proprioception: Appears intact   POSTURE: No Significant postural limitations  PELVIC ALIGNMENT: correct alignment  LUMBARAROM/PROM:Lumbar ROM is full  LOWER EXTREMITY ROM: Bilateral hip ROM is full   LOWER EXTREMITY MMT: Bilateral hip strength is 5/5   PALPATION:   General  Able to contract her abdomen correctly                External Perineal Exam firmness along the perineal body                             Internal Pelvic Floor decreased  movement of the puborectalis, some difficulty with contracting the lower abdominals with pelvic floor contraction  Patient confirms identification and approves PT to assess internal pelvic floor and treatment Yes  PELVIC MMT:   MMT eval  Internal Anal Sphincter 3/5 hold 22 sec 4x  External Anal Sphincter 3/5 hold 22 sec 4x  Puborectalis 2/5 22 sec 4x  (Blank rows = not tested)        TONE: average    TODAY'S TREATMENT:     07/09/23 Neuromuscular re-education: Pelvic floor contraction training: Using the RUSI on the pelvic diaphragm setting and the ultrasound head at the perineal body and coronal plane and patient is in supine.  Working on pelvic floor contract and feeling the contraction Working on equal right and left sides of the rectum contracting Working on the ability to breath without losing the pelvic floor contraction                                                                                                                             DATE: 07/01/23  EVAL See below   PATIENT EDUCATION:  07/09/23 Education details: Access Code: [email or web address] Person educated: Patient Education method: Explanation, Demonstration, Tactile cues, Verbal cues, and Handouts Education comprehension: verbalized understanding, returned demonstration,  verbal cues required, tactile cues required, and needs further education  HOME EXERCISE PROGRAM: 07/09/23 Access Code: [email or web address] URL: [email or web address] Date: 07/09/2023 Prepared by: Eulis Foster  Exercises - Seated Pelvic Floor Contraction  - 3 x daily - 7 x weekly - 1 sets - 5 reps - 25 hold - Seated Quick Flick Pelvic Floor Contractions  - 3 x daily - 7 x weekly - 3 sets - 5 reps - Supine Hip Adduction Isometric with Ball  - 1 x daily - 7 x weekly - 1 sets - 10 reps - 25 sec hold  ASSESSMENT:  CLINICAL IMPRESSION: Patient is a 73 y.o. female who was seen today for physical therapy  treatment for fecal incontinence. Patient  will contract the rectum going more to the left. She will be able to contract equal sides when there is hip adductor on the right resistance. She is able to contract the pelvic floor for 22 seconds. She will relax the rectum slightly with breath.  Patient will benefit from skilled therapy to work on pelvic  floor endurance and reduction of urgency.   OBJECTIVE IMPAIRMENTS: decreased coordination and decreased strength.   ACTIVITY LIMITATIONS: continence  PARTICIPATION LIMITATIONS: community activity  PERSONAL FACTORS: Time since onset of injury/illness/exacerbation are also affecting patient's functional outcome.   REHAB POTENTIAL: Excellent  CLINICAL DECISION MAKING: Stable/uncomplicated  EVALUATION COMPLEXITY: Low   GOALS: Goals reviewed with patient? Yes  SHORT TERM GOALS: Target date: 07/29/23  Patient independent with initial HEP. Baseline: Goal status: INITIAL  2.  Patient is able to bring the puborectalis forward and strength increased to 3/5.  Baseline:  Goal status: INITIAL  3.  Patient educated on the urge to void technique for when she gets the urge to have a bowel movement.  Baseline:  Goal status: INITIAL   LONG TERM GOALS: Target date: 08/26/23  Patient independent with advanced HEP for core and pelvic floor to reduce urgency.  Baseline:  Goal status: INITIAL  2.  Patient is able to delay the urge to have a bowel movement for 10 minutes so she is able to slowly walk to the bathroom.  Baseline:  Goal status: INITIAL  3.  Patient is able to contract her the rectum >/= 3/5 for 40 seconds for continence 10 x.  Baseline:  Goal status: INITIAL  4.  Patient reports her urgency for a bowel movement has decreased >/= 60%.  Baseline:  Goal status: INITIAL   PLAN:  PT FREQUENCY: 1x/week  PT DURATION: 8 weeks  PLANNED INTERVENTIONS: 97110-Therapeutic exercises, 97530- Therapeutic activity, 97112- Neuromuscular re-education, 97140- Manual therapy, 97014-  Electrical stimulation (unattended), [phone]- Electrical stimulation (manual), Patient/Family education, and Biofeedback  PLAN FOR NEXT SESSION: RUSI; manual work to the puborectalis to come forward, elevators, go over urgency  Eulis Foster, PT 07/09/23 11:26 AM

## 2023-07-10 ENCOUNTER — Encounter: Payer: Self-pay | Admitting: Internal Medicine

## 2023-07-10 NOTE — Progress Notes (Signed)
No significant findings on Ct scan  reassuring. Minimal old scarring but doesn't seem would cause a problem

## 2023-07-27 ENCOUNTER — Telehealth: Payer: Self-pay

## 2023-07-27 ENCOUNTER — Other Ambulatory Visit: Payer: Self-pay | Admitting: Internal Medicine

## 2023-07-27 DIAGNOSIS — M810 Age-related osteoporosis without current pathological fracture: Secondary | ICD-10-CM

## 2023-07-27 NOTE — Telephone Encounter (Signed)
Pt called wanting to know how do you feel about her getting a REM test/scan and she is also needed a bone density order placed. She would like for your response to be sent via mychart.

## 2023-07-28 DIAGNOSIS — H5000 Unspecified esotropia: Secondary | ICD-10-CM | POA: Diagnosis not present

## 2023-07-28 DIAGNOSIS — H5203 Hypermetropia, bilateral: Secondary | ICD-10-CM | POA: Diagnosis not present

## 2023-07-28 DIAGNOSIS — H2513 Age-related nuclear cataract, bilateral: Secondary | ICD-10-CM | POA: Diagnosis not present

## 2023-08-03 ENCOUNTER — Other Ambulatory Visit: Payer: Self-pay | Admitting: Obstetrics & Gynecology

## 2023-08-03 DIAGNOSIS — Z1231 Encounter for screening mammogram for malignant neoplasm of breast: Secondary | ICD-10-CM

## 2023-08-06 ENCOUNTER — Ambulatory Visit: Payer: Medicare HMO | Attending: Gastroenterology | Admitting: Physical Therapy

## 2023-08-06 ENCOUNTER — Encounter: Payer: Self-pay | Admitting: Physical Therapy

## 2023-08-06 DIAGNOSIS — R279 Unspecified lack of coordination: Secondary | ICD-10-CM | POA: Diagnosis not present

## 2023-08-06 DIAGNOSIS — R293 Abnormal posture: Secondary | ICD-10-CM

## 2023-08-06 DIAGNOSIS — M6281 Muscle weakness (generalized): Secondary | ICD-10-CM

## 2023-08-06 NOTE — Therapy (Signed)
OUTPATIENT PHYSICAL THERAPY FEMALE PELVIC TREATMENT   Patient Name: Heather Goodwin MRN: [id number] DOB:[birth year], 73 y.o., female Today's Date: 08/06/2023  END OF SESSION:  PT End of Session - 08/06/23 0853     Visit Number 3    Date for PT Re-Evaluation 08/26/23    Authorization Type [employer]    Authorization - Visit Number 3    Progress Note Due on Visit 10    PT Start Time [phone]    PT Stop Time 0930    PT Time Calculation (min) 40 min    Activity Tolerance Patient tolerated treatment well    Behavior During Therapy [facility] for tasks assessed/performed             Past Medical History:  Diagnosis Date   Beta thalassemia trait    Cataract Early stage   Foot fracture    at gym   Osteoporosis    Peripheral neuropathy 04/01/2020   Past Surgical History:  Procedure Laterality Date   COLONOSCOPY  2006   normal -Dr.Brodie   COLONOSCOPY  2012   Dr.Brodie-hyperplastic polyps   FRACTURE SURGERY  Meniscus repair   left knee surgery  2009   meniscus    US ECHOCARDIOGRAPHY  2005   normal for syncope    Patient Active Problem List   Diagnosis Date Noted   Encounter for counseling 01/06/2022   Peripheral neuropathy 04/01/2020   Genetic testing 12/20/2018   Family history of breast cancer    Family history of melanoma    Paresthesia of left leg 02/13/2015   Osteopenia 10/06/2013   Visit for preventive health examination 10/06/2013   Fatigue 03/02/2011   Family history of pancreatic cancer 03/02/2011   OTHER DYSPHAGIA 12/31/2009   OTHER THALASSEMIA 09/23/2007    PCP: Madelin Headings, MD  REFERRING PROVIDER: Napoleon Form, MD   REFERRING DIAG: R15.9 (ICD-10-CM) - Incontinence of feces, unspecified fecal incontinence type   THERAPY DIAG:  Muscle weakness (generalized)  Unspecified lack of coordination  Abnormal posture  Rationale for Evaluation and Treatment: Rehabilitation  ONSET DATE: 2019  SUBJECTIVE:                                                                                                                                                                                            SUBJECTIVE STATEMENT: No change at this time. I am able to feel the pelvic floor contracting.    PAIN:  Are you having pain? No  PRECAUTIONS: None  RED FLAGS: None   WEIGHT BEARING RESTRICTIONS: No  FALLS:  Has patient fallen in last 6 months?  Declines to answer.  She does not feel her balance is an issue   OCCUPATION: retired  PLOF: Independent  PATIENT GOALS: reduce the strong urge to have a bowel  movement  PERTINENT HISTORY:  osteoporosis  BOWEL MOVEMENT: Pain with bowel movement: No Type of bowel movement:Type (Bristol Stool Scale) soft Type 4, has to wipe often, Frequency daily 2x, Strain No, and Splinting yes Fully empty rectum: Yes:   Leakage: Yes: 1 time in 11/2022 Pads: No Fiber supplement: Yes: Benfiber 3 tsp.   URINATION: Pain with urination: No Leakage:  none   PREGNANCY: Vaginal deliveries 3 Tearing Yes: not sure how much  PROLAPSE: None   OBJECTIVE:  Note: Objective measures were completed at Evaluation unless otherwise noted.  DIAGNOSTIC FINDINGS:  none   COGNITION: Overall cognitive status: Within functional limits for tasks assessed     SENSATION: Light touch: Appears intact Proprioception: Appears intact   POSTURE: No Significant postural limitations  PELVIC ALIGNMENT: correct alignment  LUMBARAROM/PROM:Lumbar ROM is full  LOWER EXTREMITY ROM: Bilateral hip ROM is full   LOWER EXTREMITY MMT: Bilateral hip strength is 5/5   PALPATION:   General  Able to contract her abdomen correctly                External Perineal Exam firmness along the perineal body                             Internal Pelvic Floor decreased movement of the puborectalis, some difficulty with contracting the lower abdominals with pelvic floor contraction  Patient confirms identification and approves PT  to assess internal pelvic floor and treatment Yes  PELVIC MMT:   MMT eval  Internal Anal Sphincter 3/5 hold 22 sec 4x  External Anal Sphincter 3/5 hold 22 sec 4x  Puborectalis 2/5 22 sec 4x  (Blank rows = not tested)        TONE: average    TODAY'S TREATMENT:   08/06/23 Neuromuscular re-education: Self care Educated patient on pelvic floor biofeedback units to use at home to work on pelvic floor contraction and endurance Pelvic floor contraction training: Using the pelvic floor EMG with surface electrodes on the outside of the rectum Resting level 4 uv in standing  Standing quick contraction to 33 uv, working on good recruitment and quick relaxation Standing contraction to 30 uv for 5 to 12 sec. With verbal cues to keep the rib cage over the pelvis Supine contracting the abdominals without bulging and engaging the anal contraction and was able to hold up to 5 seconds    07/09/23 Neuromuscular re-education: Pelvic floor contraction training: Using the RUSI on the pelvic diaphragm setting and the ultrasound head at the perineal body and coronal plane and patient is in supine.  Working on pelvic floor contract and feeling the contraction Working on equal right and left sides of the rectum contracting Working on the ability to breath without losing the pelvic floor contraction      PATIENT EDUCATION:  07/09/23 Education details: Access Code: [email or web address] Person educated: Patient Education method: Explanation, Demonstration, Tactile cues, Verbal cues, and Handouts Education comprehension: verbalized understanding, returned demonstration, verbal cues required, tactile cues required, and needs further education  HOME EXERCISE PROGRAM: 07/09/23 Access Code: [email or web address] URL: [email or web address] Date: 07/09/2023 Prepared by: Eulis Foster  Exercises - Seated Pelvic Floor Contraction  - 3 x daily - 7 x weekly - 1 sets - 5 reps - 25 hold - Seated Quick Flick Pelvic Floor  Contractions  - 3 x daily - 7 x weekly - 3 sets - 5 reps - Supine Hip Adduction Isometric with Ball  - 1 x daily - 7 x weekly - 1 sets - 10 reps - 25 sec hold  ASSESSMENT:  CLINICAL IMPRESSION: Patient is a 73 y.o. female who was seen today for physical therapy  treatment for fecal incontinence. Patient has a good resting level for pelvic floor at 4 uv. She is able to contract to 30 uv for 5-12 seconds. She is able to contract the abdominals with anal contraction for 5 seconds in supine but needs cues to not bulge the pelvic floor.  She needs verbal cues to keep her rib cage over her pelvis in standing with pelvic floor contraction. Patient will benefit from skilled therapy to work on pelvic  floor endurance and reduction of urgency.   OBJECTIVE IMPAIRMENTS: decreased coordination and decreased strength.   ACTIVITY LIMITATIONS: continence  PARTICIPATION LIMITATIONS: community activity  PERSONAL FACTORS: Time since onset of injury/illness/exacerbation are also affecting patient's functional outcome.   REHAB POTENTIAL: Excellent  CLINICAL DECISION MAKING: Stable/uncomplicated  EVALUATION COMPLEXITY: Low   GOALS: Goals reviewed with patient? Yes  SHORT TERM GOALS: Target date: 07/29/23  Patient independent with initial HEP. Baseline: Goal status: Met 08/06/23  2.  Patient is able to bring the puborectalis forward and strength increased to 3/5.  Baseline:  Goal status: INITIAL  3.  Patient educated on the urge to void technique for when she gets the urge to have a bowel movement.  Baseline:  Goal status: INITIAL   LONG TERM GOALS: Target date: 08/26/23  Patient independent with advanced HEP for core and pelvic floor to reduce urgency.  Baseline:  Goal status: INITIAL  2.  Patient is able to delay the urge to have a bowel movement for 10 minutes so she is able to slowly walk to the bathroom.  Baseline:  Goal status: INITIAL  3.  Patient is able to contract her the rectum >/=  3/5 for 40 seconds for continence 10 x.  Baseline:  Goal status: INITIAL  4.  Patient reports her urgency for a bowel movement has decreased >/= 60%.  Baseline:  Goal status: INITIAL   PLAN:  PT FREQUENCY: 1x/week  PT DURATION: 8 weeks  PLANNED INTERVENTIONS: 97110-Therapeutic exercises, 97530- Therapeutic activity, [id number]- Neuromuscular re-education, 97140- Manual therapy, 97014- Electrical stimulation (unattended), [phone]- Electrical stimulation (manual), Patient/Family education, and Biofeedback  PLAN FOR NEXT SESSION:  elevators, go over urgency, use the pelvic floor EMG with abdominal contraction  Eulis Foster, PT 08/06/23 9:37 AM

## 2023-08-09 ENCOUNTER — Telehealth: Payer: Self-pay

## 2023-08-09 NOTE — Telephone Encounter (Signed)
 Pt called and left a vm stating that she called to get her bone density scheduled but they are booked out until September. She wants to know does she need to reschedule her appt with you?

## 2023-08-09 NOTE — Telephone Encounter (Signed)
 No need to cancel the appointment.  I would keep our appointment in April.

## 2023-08-13 ENCOUNTER — Other Ambulatory Visit: Payer: Medicare HMO

## 2023-08-13 ENCOUNTER — Encounter: Payer: Self-pay | Admitting: Physical Therapy

## 2023-08-31 DIAGNOSIS — K219 Gastro-esophageal reflux disease without esophagitis: Secondary | ICD-10-CM | POA: Diagnosis not present

## 2023-08-31 DIAGNOSIS — H903 Sensorineural hearing loss, bilateral: Secondary | ICD-10-CM | POA: Diagnosis not present

## 2023-09-01 ENCOUNTER — Ambulatory Visit
Admission: RE | Admit: 2023-09-01 | Discharge: 2023-09-01 | Disposition: A | Discharge status: Home / Self Care | Payer: Medicare HMO | Source: Ambulatory Visit | Attending: Internal Medicine | Admitting: Internal Medicine

## 2023-09-01 DIAGNOSIS — E2839 Other primary ovarian failure: Secondary | ICD-10-CM | POA: Diagnosis not present

## 2023-09-01 DIAGNOSIS — N958 Other specified menopausal and perimenopausal disorders: Secondary | ICD-10-CM | POA: Diagnosis not present

## 2023-09-01 DIAGNOSIS — M810 Age-related osteoporosis without current pathological fracture: Secondary | ICD-10-CM

## 2023-09-01 DIAGNOSIS — M8588 Other specified disorders of bone density and structure, other site: Secondary | ICD-10-CM | POA: Diagnosis not present

## 2023-09-06 ENCOUNTER — Encounter: Payer: Self-pay | Admitting: Internal Medicine

## 2023-09-15 ENCOUNTER — Encounter: Payer: Self-pay | Admitting: Physical Therapy

## 2023-09-15 ENCOUNTER — Ambulatory Visit: Payer: Self-pay | Admitting: Physical Therapy

## 2023-09-16 ENCOUNTER — Ambulatory Visit
Admission: RE | Admit: 2023-09-16 | Discharge: 2023-09-16 | Disposition: A | Discharge status: Home / Self Care | Payer: Medicare HMO | Source: Ambulatory Visit | Attending: Obstetrics & Gynecology | Admitting: Obstetrics & Gynecology

## 2023-09-16 DIAGNOSIS — Z1231 Encounter for screening mammogram for malignant neoplasm of breast: Secondary | ICD-10-CM | POA: Diagnosis not present

## 2023-09-17 ENCOUNTER — Ambulatory Visit: Attending: Gastroenterology | Admitting: Physical Therapy

## 2023-09-17 ENCOUNTER — Encounter: Payer: Self-pay | Admitting: Physical Therapy

## 2023-09-17 DIAGNOSIS — R279 Unspecified lack of coordination: Secondary | ICD-10-CM | POA: Insufficient documentation

## 2023-09-17 DIAGNOSIS — M6281 Muscle weakness (generalized): Secondary | ICD-10-CM | POA: Diagnosis not present

## 2023-09-17 NOTE — Patient Instructions (Signed)
 Urge Incontinence  Ideal urination frequency is every 2-4 wakeful hours, which equates to 5-8 times within a 24-hour period.   Urge incontinence is leakage that occurs when the bladder muscle contracts, creating a sudden need to go before getting to the bathroom.   Going too often when your bladder isn't actually full can disrupt the body's automatic signals to store and hold urine longer, which will increase urgency/frequency.  In this case, the bladder "is running the show" and strategies can be learned to retrain this pattern.   One should be able to control the first urge to urinate, at around .  The bladder can hold up to a "grande latte," or . To help you gain control, practice the Urge Drill below when urgency strikes.  This drill will help retrain your bladder signals and allow you to store and hold urine longer.  The overall goal is to stretch out your time between voids to reach a more manageable voiding schedule.    Practice your "quick flicks" often throughout the day (each waking hour) even when you don't need feel the urge to go.  This will help strengthen your pelvic floor muscles, making them more effective in controlling leakage.  Urge Drill  When you feel an urge to go, follow these steps to regain control: Stop what you are doing and be still Take one deep breath, directing your air into your abdomen Think an affirming thought, such as "I've got this." Do 5 quick flicks of your pelvic floor Lift heels and hit the ground hard  Walk with control to the bathroom to void, or delay voiding   [facility] [address] [place], [place] [address] Phone # [phone] Fax [phone]

## 2023-09-17 NOTE — Therapy (Signed)
 OUTPATIENT PHYSICAL THERAPY FEMALE PELVIC TREATMENT   Patient Name: Heather Goodwin MRN: [id number] DOB:1950-10-29, 73 y.o., female Today's Date: 09/17/2023  END OF SESSION:  PT End of Session - 09/17/23 0935     Visit Number 4    Date for PT Re-Evaluation 11/18/23    Authorization Type [employer]    Authorization - Visit Number 4    Progress Note Due on Visit 10    PT Start Time 0930    PT Stop Time 1008    PT Time Calculation (min) 38 min    Activity Tolerance Patient tolerated treatment well    Behavior During Therapy [facility] for tasks assessed/performed             Past Medical History:  Diagnosis Date   Beta thalassemia trait    Cataract Early stage   Foot fracture    at gym   Osteoporosis    Peripheral neuropathy 04/01/2020   Past Surgical History:  Procedure Laterality Date   COLONOSCOPY  2006   normal -Dr.Brodie   COLONOSCOPY  2012   Dr.Brodie-hyperplastic polyps   FRACTURE SURGERY  Meniscus repair   left knee surgery  2009   meniscus    US ECHOCARDIOGRAPHY  2005   normal for syncope    Patient Active Problem List   Diagnosis Date Noted   Encounter for counseling 01/06/2022   Peripheral neuropathy 04/01/2020   Genetic testing 12/20/2018   Family history of breast cancer    Family history of melanoma    Paresthesia of left leg 02/13/2015   Osteopenia 10/06/2013   Visit for preventive health examination 10/06/2013   Fatigue 03/02/2011   Family history of pancreatic cancer 03/02/2011   OTHER DYSPHAGIA 12/31/2009   OTHER THALASSEMIA 09/23/2007    PCP: Madelin Headings, MD  REFERRING PROVIDER: Napoleon Form, MD   REFERRING DIAG: R15.9 (ICD-10-CM) - Incontinence of feces, unspecified fecal incontinence type   THERAPY DIAG:  Muscle weakness (generalized) - Plan: PT plan of care cert/re-cert  Unspecified lack of coordination - Plan: PT plan of care cert/re-cert  Rationale for Evaluation and Treatment: Rehabilitation  ONSET DATE:  2019  SUBJECTIVE:                                                                                                                                                                                           SUBJECTIVE STATEMENT: I am the same. I am able to feel the pelvic floor contract. I had fecal leakage 2 months ago. I only get it for 3 times per year. Patient not wiping as often when having a bowel movements.  PAIN:  Are you having pain? No  PRECAUTIONS: None  RED FLAGS: None   WEIGHT BEARING RESTRICTIONS: No  FALLS:  Has patient fallen in last 6 months?  Declines to answer. She does not feel her balance is an issue   OCCUPATION: retired  PLOF: Independent  PATIENT GOALS: reduce the strong urge to have a bowel  movement  PERTINENT HISTORY:  osteoporosis  BOWEL MOVEMENT: Pain with bowel movement: No Type of bowel movement:Type (Bristol Stool Scale) soft Type 4, has to wipe often, Frequency daily 2x, Strain No, and Splinting yes Fully empty rectum: Yes:   Leakage: Yes: 1 time in 11/2022 Pads: No Fiber supplement: Yes: Benfiber 3 tsp.   URINATION: Pain with urination: No Leakage:  none   PREGNANCY: Vaginal deliveries 3 Tearing Yes: not sure how much  PROLAPSE: None   OBJECTIVE:  Note: Objective measures were completed at Evaluation unless otherwise noted.  DIAGNOSTIC FINDINGS:  none   COGNITION: Overall cognitive status: Within functional limits for tasks assessed     SENSATION: Light touch: Appears intact Proprioception: Appears intact   POSTURE: No Significant postural limitations  PELVIC ALIGNMENT: correct alignment  LUMBARAROM/PROM:Lumbar ROM is full  LOWER EXTREMITY ROM: Bilateral hip ROM is full   LOWER EXTREMITY MMT: Bilateral hip strength is 5/5   PALPATION:   General  Able to contract her abdomen correctly                External Perineal Exam firmness along the perineal body                             Internal Pelvic Floor  decreased movement of the puborectalis, some difficulty with contracting the lower abdominals with pelvic floor contraction  Patient confirms identification and approves PT to assess internal pelvic floor and treatment Yes  PELVIC MMT:   MMT eval  Internal Anal Sphincter 3/5 hold 22 sec 4x  External Anal Sphincter 3/5 hold 22 sec 4x  Puborectalis 2/5 22 sec 4x  (Blank rows = not tested)        TONE: average    TODAY'S TREATMENT:   09/17/23 Neuromuscular re-education: Pelvic floor contraction training: Patient demonstrated her gym exercises with therapist giving tactile cues to engage the core, breath out with hardest part of the exercise and engaging the pelvic floor Sitting pelvic floor contraction with elevators to increase strength and control of the pelvic floor Self-care: Educated patient on increasing her Benefiber to increase the consistency of her stool to reduce the amount of wiping she does with a bowel movement Educated patient on the urge to have a bowel movement behavioral technique to reduce the urge so she is able to walk to the bathroom calmly and slowly    08/06/23 Neuromuscular re-education: Self care Educated patient on pelvic floor biofeedback units to use at home to work on pelvic floor contraction and endurance Pelvic floor contraction training: Using the pelvic floor EMG with surface electrodes on the outside of the rectum Resting level 4 uv in standing  Standing quick contraction to 33 uv, working on good recruitment and quick relaxation Standing contraction to 30 uv for 5 to 12 sec. With verbal cues to keep the rib cage over the pelvis Supine contracting the abdominals without bulging and engaging the anal contraction and was able to hold up to 5 seconds    07/09/23 Neuromuscular re-education: Pelvic floor contraction training: Using the RUSI on the pelvic diaphragm  setting and the ultrasound head at the perineal body and coronal plane and patient is  in supine.  Working on pelvic floor contract and feeling the contraction Working on equal right and left sides of the rectum contracting Working on the ability to breath without losing the pelvic floor contraction      PATIENT EDUCATION:  09/17/23 Education details: Access Code: [email or web address] Person educated: Patient Education method: Explanation, Demonstration, Tactile cues, Verbal cues, and Handouts Education comprehension: verbalized understanding, returned demonstration, verbal cues required, tactile cues required, and needs further education  HOME EXERCISE PROGRAM: 09/17/23 Access Code: [email or web address] URL: [email or web address] Date: 09/17/2023 Prepared by: Eulis Foster  Program Notes work on increasing your Benefiber to firm up your stoolincorporate your pelvic floor strength when going to the gym  Exercises - Seated Pelvic Floor Contraction  - 2 x daily - 7 x weekly - 1 sets - 5 reps - 25 hold - Seated Pelvic Floor Elevators  - 2 x daily - 7 x weekly - 1 sets - 5 reps - Seated Quick Flick Pelvic Floor Contractions  - 2 x daily - 7 x weekly - 1 sets - 5 reps - Mini Squat with Pelvic Floor Contraction  - 1 x daily - 2 x weekly - 1 sets - 10 reps  ASSESSMENT:  CLINICAL IMPRESSION: Patient is a 73 y.o. female who was seen today for physical therapy  treatment for fecal incontinence. Patient has a good resting level for pelvic floor at 4 uv. She is able to contract to 30 uv for 5-12 seconds. She is able to contract the abdominals with anal contraction for 5 seconds in supine but needs cues to not bulge the pelvic floor.  She is able to feel the pelvic floor. She has not had fecal leakage for 2 months. She is wiping her rectum less now.   OBJECTIVE IMPAIRMENTS: decreased coordination and decreased strength.   ACTIVITY LIMITATIONS: continence  PARTICIPATION LIMITATIONS: community activity  PERSONAL FACTORS: Time since onset of injury/illness/exacerbation are also affecting  patient's functional outcome.   REHAB POTENTIAL: Excellent  CLINICAL DECISION MAKING: Stable/uncomplicated  EVALUATION COMPLEXITY: Low   GOALS: Goals reviewed with patient? Yes  SHORT TERM GOALS: Target date: 07/29/23  Patient independent with initial HEP. Baseline: Goal status: Met 08/06/23  2.  Patient is able to bring the puborectalis forward and strength increased to 3/5.  Baseline:  Goal status: not assessed due to patient not agreeing to internal assessment  3.  Patient educated on the urge to void technique for when she gets the urge to have a bowel movement.  Baseline:  Goal status: Met 09/17/23   LONG TERM GOALS: Target date: 08/26/23  Patient independent with advanced HEP for core and pelvic floor to reduce urgency.  Baseline:  Goal status: Met 09/17/23  2.  Patient is able to delay the urge to have a bowel movement for 10 minutes so she is able to slowly walk to the bathroom.  Baseline:  Goal status: Not met 09/17/23  3.  Patient is able to contract her the rectum >/= 3/5 for 40 seconds for continence 10 x.  Baseline:  Goal status: not assessed due to patient prefers not internal assessment  4.  Patient reports her urgency for a bowel movement has decreased >/= 60%.  Baseline:  Goal status: not met 09/17/23   PLAN:  PT FREQUENCY: one time visit for renewal and discharge  PT DURATION: 1 week  PLANNED INTERVENTIONS: 97110-Therapeutic exercises, 97530- Therapeutic activity, 97112- Neuromuscular  re-education, 97140- Manual therapy, 97014- Electrical stimulation (unattended), [id number]- Electrical stimulation (manual), Patient/Family education, and Biofeedback  PLAN FOR NEXT SESSION:  elevators, go over urgency, Discharge patient at this visit.   Eulis Foster, PT 09/17/23 10:57 AM   PHYSICAL THERAPY DISCHARGE SUMMARY  Visits from Start of Care: 4  Current functional level related to goals / functional outcomes: See above.    Remaining deficits: See above.     Education / Equipment: HEP   Patient agrees to discharge. Patient goals were partially met. Patient is being discharged due to being pleased with the current functional level. Thank you for the referral.   Eulis Foster, PT 09/17/23 10:57 AM

## 2023-09-28 ENCOUNTER — Encounter: Payer: Self-pay | Admitting: Internal Medicine

## 2023-09-28 ENCOUNTER — Ambulatory Visit: Payer: Medicare HMO | Admitting: Internal Medicine

## 2023-09-28 VITALS — BP 112/60 | HR 80 | Ht 66.75 in

## 2023-09-28 DIAGNOSIS — M810 Age-related osteoporosis without current pathological fracture: Secondary | ICD-10-CM

## 2023-09-28 LAB — VITAMIN D 25 HYDROXY (VIT D DEFICIENCY, FRACTURES): Vit D, 25-Hydroxy: 39 ng/mL (ref 30–100)

## 2023-09-28 NOTE — Progress Notes (Addendum)
 Patient ID: Heather Goodwin, female   DOB: [birth year], 73 y.o.   MRN: [id number]  HPI  Heather Goodwin is a 73 y.o.-year-old female, referred by her PCP, Dr.Panosh, for management of osteoporosis (OP).  Last visit 1 year ago.  Interim history: No falls or fractures since last visit. No dizziness/vertigo/orthostasis/poor vision.  She is now going consistently to [employer].  She is also doing more weightbearing exercises. She increased her weights since last OV. She is using a weight vest (16 lbs).  She is also playing tennis, walking, running.  Reviewed and addended history: Pt was dx with OP in 2020.  I reviewed pt's DXA scans ([facility]): Date L1-L4 T score FN T score 33% distal Radius Ultradistal radius  09/01/2023 L1-L4 (L2, L3): -2.7 (+1.2%) RFN: -1.3 LFN: -1.5 -3.2 -3.4  08/26/2022 L1-L4 (L3): -2.7 RFN: -1.3 LFN: -1.8 -2.6 -3.5  07/16/2021 -3.0 RFN: -1.4 LFN: -1.6 -3.1 -3.7  01/18/2019 -2.5 RFN: -1.0 LFN: -1.5 N/a N/a  09/26/2013 -2.1 RFN: n/a LFN: -1.4 N/a  N/a   Fractures: - Right foot in 2007 - from jumping on a trampoline in the gym - Left wrist 2018 - slipped - however, unclear if full fracture or just sprain  She was not on previous OP treatments.  She would not want to start any.  No h/o vitamin D deficiency.  No available vit D levels for review. Lab Results  Component Value Date   VD25OH 42.19 09/24/2022   VD25OH 46.63 09/19/2021   Pt is on: - vitamin D 2000 >> 4000 units >> 500 units >> 2000 units from supplements  + weight bearing exercises - goes to the gym.  She is also walking/hiking/playing tennis.  She does not take high vitamin A doses.  Menopause was at 73 y/o.   FH of osteoporosis: mother, PGM.  No h/o hyper/hypocalcemia or hyperparathyroidism. No h/o kidney stones. Lab Results  Component Value Date   PTH 22 09/19/2021   PTH Comment 09/19/2021   CALCIUM 10.0 04/15/2023   CALCIUM 9.7 01/21/2022   CALCIUM 10.0 09/19/2021   CALCIUM 10.0  03/06/2021   CALCIUM 9.9 10/03/2019   CALCIUM 10.1 01/13/2018   CALCIUM 10.1 03/02/2011   CALCIUM 9.8 12/25/2009   CALCIUM 10.1 09/19/2007   No h/o thyrotoxicosis. Reviewed TSH recent levels:  Lab Results  Component Value Date   TSH 1.66 04/15/2023   TSH 1.85 04/01/2022   TSH 1.92 03/06/2021   TSH 1.85 10/03/2019   TSH 1.17 01/13/2018   No h/o CKD. Last BUN/Cr: Lab Results  Component Value Date   BUN 17 04/15/2023   CREATININE 0.71 04/15/2023   SPEP negative: Component     Latest Ref Rng 04/01/2020  IgG (Immunoglobin G), Serum     586 - 1,602 mg/dL 409   IgA/Immunoglobulin A, Serum     87 - 352 mg/dL 811   IgM (Immunoglobulin M), Srm     26 - 217 mg/dL 914   Total Protein     6.0 - 8.5 g/dL 6.9   Albumin SerPl Elph-Mcnc     2.9 - 4.4 g/dL 4.1   Alpha 1     0.0 - 0.4 g/dL 0.2   Alpha2 Glob SerPl Elph-Mcnc     0.4 - 1.0 g/dL 0.6   B-Globulin SerPl Elph-Mcnc     0.7 - 1.3 g/dL 1.0   Gamma Glob SerPl Elph-Mcnc     0.4 - 1.8 g/dL 1.0   M Protein SerPl Elph-Mcnc  Not Observed g/dL Not Observed   Globulin, Total     2.2 - 3.9 g/dL 2.8   Albumin/Glob SerPl     0.7 - 1.7  1.5   IFE 1 immunofixation pattern appears unremarkable. Evidence of  monoclonal protein is not apparent.  Please Note (HCV): Comment    In 2023, she was on colestipol 1 g twice a day -used for stool incontinence/diarrhea when hiking.  She started to decrease the dose to once a day and came off before our visit from 2024.  ROS: + See HPI + Tinnitus, hypoacusis  I reviewed pt's medications, allergies, PMH, social hx, family hx, and changes were documented in the history of present illness. Otherwise, unchanged from my initial visit note.  Past Medical History:  Diagnosis Date   Beta thalassemia trait    Cataract Early stage   Foot fracture    at gym   Osteoporosis    Peripheral neuropathy 04/01/2020   Past Surgical History:  Procedure Laterality Date   COLONOSCOPY  2006   normal  -Dr.Brodie   COLONOSCOPY  2012   Dr.Brodie-hyperplastic polyps   FRACTURE SURGERY  Meniscus repair   left knee surgery  2009   meniscus    US ECHOCARDIOGRAPHY  2005   normal for syncope    Social History   Socioeconomic History   Marital status: Married    Spouse name: Not on file   Number of children: 3   Years of education: Not on file   Highest education level: Not on file  Occupational History   Occupation: Real Estate   Occupation: Self-employed  Tobacco Use   Smoking status: Never   Smokeless tobacco: Never  Vaping Use   Vaping status: Never Used  Substance and Sexual Activity   Alcohol use: Yes    Alcohol/week: 1.0 - 4.0 standard drink of alcohol    Types: 1 - 4 Glasses of wine per week    Comment: occ   Drug use: Never   Sexual activity: Not on file  Other Topics Concern   Not on file  Social History Narrative   Lives at home with husband   2-3 caffeine drinks daily   Patient is right handed.         Social Drivers of Corporate investment banker Strain: Not on file  Food Insecurity: Not on file  Transportation Needs: Not on file  Physical Activity: Sufficiently Active (04/15/2023)   Exercise Vital Sign    Days of Exercise per Week: 4 days    Minutes of Exercise per Session: 60 min  Stress: No Stress Concern Present (04/15/2023)   [employer] of Occupational Health - Occupational Stress Questionnaire    Feeling of Stress : Not at all  Social Connections: Unknown (04/15/2023)   Social Connection and Isolation Panel [NHANES]    Frequency of Communication with Friends and Family: Patient declined    Frequency of Social Gatherings with Friends and Family: Patient declined    Attends Religious Services: Patient declined    Database administrator or Organizations: Yes    Attends Engineer, structural: More than 4 times per year    Marital Status: Married  Catering manager Violence: Not on file   Current Outpatient Medications on File Prior  to Visit  Medication Sig Dispense Refill   NON FORMULARY Compound Rx contain amino acid for osteoporosis.     Wheat Dextrin (BENEFIBER DRINK MIX PO) Take by mouth in the morning and at bedtime.  3 teaspoons two times  daily     No current facility-administered medications on file prior to visit.   No Known Allergies  Family History  Problem Relation Age of Onset   Thyroid disease Mother        partial thyroidectomy   Pancreatic cancer Mother 11   Cirrhosis Father        secondary to chronic hepatitis b infection   Other Son        B Thal trait   Breast cancer Paternal Aunt 88   Breast cancer Maternal Aunt 23   Pancreatic cancer Maternal Grandfather        dx 75-80   Melanoma Brother 5   Colon cancer Neg Hx    Esophageal cancer Neg Hx    Rectal cancer Neg Hx    PE: BP 112/60   Pulse 80   Ht 5' 6.75" (1.695 m)   SpO2 95%   BMI 24.02 kg/m  Wt Readings from Last 3 Encounters:  06/24/23 152 lb 3.2 oz (69 kg)  04/15/23 148 lb 6.4 oz (67.3 kg)  02/10/23 148 lb (67.1 kg)   Constitutional: normal weight, in NAD Eyes:  EOMI, no exophthalmos ENT: no neck masses, no cervical lymphadenopathy Cardiovascular: RRR, No MRG Respiratory: CTA B Musculoskeletal: no deformities Skin:no rashes Neurological: no tremor with outstretched hands  Assessment: 1. Osteoporosis  Plan: 1. Osteoporosis - Menopausal/age-related.  She also has family history of osteoporosis - No falls or fractures since last visit -There is no clear history of fragility fracture.  She mentions a foot fracture in the past, however, we discussed that this is not considered the fragility (osteoporotic) fracture.  She also had a reported left distal radius fracture but this was not obvious on the recent left forearm imaging (on DXA) and it was unclear if this was actually a fracture or just a sprain. - Reviewing the previous bone density scans, she does have an increased risk for fracture, however, on the bone  density from 08/2022, there was slight improvement in almost all of the T-scores.  There has been a more significant improvement in the 33% distal radius.  She had another bone density since last visit (09/01/2023) which showed stability of T-scores at the level of the spine, improvement at the left femoral neck and decrease in the 33% distal radius.  She will be due for another bone density in 1-2 years from the previous. -At today's visit, she wonders about the reason for her osteoporosis.  I advised her that it is difficult to determine, but likely age-related.  She is wondering about other tests that can be done, for example a micronutrient panel (I advised her that this is largely unnecessary, especially if she is on good supplementation) and also different ways to evaluate bone quality.  We do not have these available here, they are mostly done at academic center, but I do not feel that she would necessarily benefit from these.  Her bone density values are quite low and even though her risk of fracture may be  better than expected due to her being very active and exercising consistently and also having smaller bones, I do not feel that checking other tests will change the need for treatment. -Of note, we checked a PTH level for her in the past and this was normal. - Upon her questioning, I again recommended 1200 mg of calcium preferentially from the diet.  She is also taking 2000 units vitamin D from the supplements.  At last visit, we checked a vitamin D level and this was normal.  I did not recommend to add a separate vitamin D supplement at that time.  She inquires about the need for vitamin K2 but for now I did not recommend to add this especially since this is present in her supplement, which she takes twice a day.  Of note, she was previously on colestyramine which we discussed can decrease the absorption of many nutrients.  Fortunately, she was able to stop cholestyramine before last visit.  She did  not feel that this was helping her diarrhea enough to continue on it. -We discussed in the past about the importance of weightbearing exercise.  She increased the amount of these type of exercises since last visit.  I previously recommended including wrist and ankle weight, backpack when walking, walking downhill as much as she can, and walking after rather than before meals.  She is doing a great job with exercise.  She recently increased her weights.  I also recommended the [facility] for skeletal loading and she is doing this consistently. -At last visit, she asked my opinion about strontium ranelate and I advised her against this due to the fact that this was not studied in fracture outcomes trials and there was an increase stroke signal in previous trials. -I also recommended low acid eating.  She is on a more plant-based diet, which I advised her to continue.  She is not smoking or drinking more than 2 alcoholic drinks a day.  She inquires about the amount of proteins that she needs to eat.  We discussed about the need for 0.8 to 1.0 g/kg/day.  We discussed about different options of getting these.  We also discussed about the difference between whey and plant proteins and I recommended the latter. -At previous visit, we discussed about the different medication classes, benefits, and side effects but she continues to prefer to manage her osteoporosis without medications. -At today's visit, we will repeat a vitamin D level.  Her most recent calcium and GFR were from 03/2023 and they were normal.  We will not repeat these today. - Per her preference, we will try to repeat another bone density scan in a year - will see pt back in 1 year  - Total time spent for the visit: 40 minutes, in precharting, post charting, obtaining medical information from the chart and from the patient, reviewing previous labs, imaging evaluations, and treatments, and counseling her about her osteoporosis.  We also  developed a plan to further investigate and treat her osteoporosis.  She had a list of questions which I addressed.    Orders Placed This Encounter  Procedures   VITAMIN D 25 Hydroxy (Vit-D Deficiency, Fractures)   Component     Latest Ref Rng 09/28/2023  Vitamin D, 25-Hydroxy     30 - 100 ng/mL 39   Vitamin D level is at goal.  Carlus Pavlov, MD PhD [facility]

## 2023-09-28 NOTE — Patient Instructions (Addendum)
 Please stop at the lab.  Continue vitamin D 2000 units daily.  Please return for a follow-up appointment in 1 year.

## 2023-12-29 ENCOUNTER — Telehealth: Payer: Self-pay | Admitting: Gastroenterology

## 2023-12-29 NOTE — Telephone Encounter (Signed)
Inbound call from patient, returning call.

## 2023-12-29 NOTE — Telephone Encounter (Signed)
 Patient was trying to schedule yearly f/u appt for IBS. Appointment scheduled for 01/03/2024 at 1410.

## 2023-12-29 NOTE — Telephone Encounter (Signed)
 Attempted to reach patient. No answer, left VM for patient to return call.

## 2023-12-29 NOTE — Telephone Encounter (Signed)
 Inbound call from patient requesting f/u call to discuss gi symptoms and possible apt with provider. Please advise.

## 2024-01-03 ENCOUNTER — Ambulatory Visit: Admitting: Gastroenterology

## 2024-01-03 ENCOUNTER — Encounter: Payer: Self-pay | Admitting: Gastroenterology

## 2024-01-03 VITALS — BP 116/68 | HR 82 | Ht 67.0 in | Wt 151.0 lb

## 2024-01-03 DIAGNOSIS — R194 Change in bowel habit: Secondary | ICD-10-CM

## 2024-01-03 DIAGNOSIS — R5383 Other fatigue: Secondary | ICD-10-CM | POA: Diagnosis not present

## 2024-01-03 DIAGNOSIS — K580 Irritable bowel syndrome with diarrhea: Secondary | ICD-10-CM

## 2024-01-03 DIAGNOSIS — Z860101 Personal history of adenomatous and serrated colon polyps: Secondary | ICD-10-CM

## 2024-01-03 DIAGNOSIS — M6289 Other specified disorders of muscle: Secondary | ICD-10-CM

## 2024-01-03 DIAGNOSIS — K5902 Outlet dysfunction constipation: Secondary | ICD-10-CM

## 2024-01-03 NOTE — Progress Notes (Signed)
 Heather Goodwin    [id number]    08-12-1950  Primary Care Physician:Panosh, Wanda K, MD  Referring Physician: Charlett Apolinar POUR, MD [address] [place],  [place] [address]   Chief complaint: IBS  Discussed the use of AI scribe software for clinical note transcription with the patient, who gave verbal consent to proceed.  History of Present Illness Heather Goodwin is a 73 year old female with a history of precancerous polyps who presents for a follow-up on bowel habits and colonoscopy scheduling.  Altered bowel habits - Completed therapy for bowel habits with Channing around May or June, resulting in improved bowel movements - Bowel movements are more formed with better evacuation - Continues physical therapy exercises at home, including use of a device called Pyruvate that involves contraction exercises similar to a video game, which she finds engaging and effective - Practices breathing techniques provided by her therapist to manage urges while walking - No current bowel issues reported - Improved bowel habits after therapy and dietary changes  Colorectal cancer surveillance - History of precancerous polyps found and removed during last colonoscopy in 2023 - Next colonoscopy scheduled for April 2026 - Concern regarding timing of next colonoscopy due to family history of cancer - Mother had a precancerous Pap smear and later developed pancreatic cancer  Decreased energy - Decreased energy over the past several weeks - Attributes decreased energy to recent travel and possibly the heat - Usually very active but has been less motivated and has enjoyed doing less - Family and friends have noticed a decrease in her usual activity level      Outpatient Encounter Medications as of 01/03/2024  Medication Sig   NON FORMULARY Compound Rx contain amino acid for osteoporosis.   Wheat Dextrin (BENEFIBER DRINK MIX PO) Take by mouth in the morning and at bedtime. 3  teaspoons two times  daily   No facility-administered encounter medications on file as of 01/03/2024.    Allergies as of 01/03/2024   (No Known Allergies)    Past Medical History:  Diagnosis Date   Beta thalassemia trait    Cataract Early stage   Foot fracture    at gym   Osteoporosis    Peripheral neuropathy 04/01/2020    Past Surgical History:  Procedure Laterality Date   COLONOSCOPY  2006   normal -Dr.Brodie   COLONOSCOPY  2012   Dr.Brodie-hyperplastic polyps   FRACTURE SURGERY  Meniscus repair   left knee surgery  2009   meniscus    US  ECHOCARDIOGRAPHY  2005   normal for syncope     Family History  Problem Relation Age of Onset   Thyroid  disease Mother        partial thyroidectomy   Pancreatic cancer Mother 53   Cirrhosis Father        secondary to chronic hepatitis b infection   Other Son        B Thal trait   Breast cancer Paternal Aunt 38   Breast cancer Maternal Aunt 57   Pancreatic cancer Maternal Grandfather        dx 75-80   Melanoma Brother 21   Colon cancer Neg Hx    Esophageal cancer Neg Hx    Rectal cancer Neg Hx     Social History   Socioeconomic History   Marital status: Married    Spouse name: Not on file   Number of children: 3   Years  of education: Not on file   Highest education level: Not on file  Occupational History   Occupation: Real Estate   Occupation: Self-employed  Tobacco Use   Smoking status: Never   Smokeless tobacco: Never  Vaping Use   Vaping status: Never Used  Substance and Sexual Activity   Alcohol use: Yes    Alcohol/week: 1.0 - 4.0 standard drink of alcohol    Types: 1 - 4 Glasses of wine per week    Comment: occ   Drug use: Never   Sexual activity: Not on file  Other Topics Concern   Not on file  Social History Narrative   Lives at home with husband   2-3 caffeine drinks daily   Patient is right handed.         Social Drivers of Corporate investment banker Strain: Not on file  Food Insecurity:  Not on file  Transportation Needs: Not on file  Physical Activity: Sufficiently Active (04/15/2023)   Exercise Vital Sign    Days of Exercise per Week: 4 days    Minutes of Exercise per Session: 60 min  Stress: No Stress Concern Present (04/15/2023)   [employer] of Occupational Health - Occupational Stress Questionnaire    Feeling of Stress : Not at all  Social Connections: Unknown (04/15/2023)   Social Connection and Isolation Panel    Frequency of Communication with Friends and Family: Patient declined    Frequency of Social Gatherings with Friends and Family: Patient declined    Attends Religious Services: Patient declined    Database administrator or Organizations: Yes    Attends Engineer, structural: More than 4 times per year    Marital Status: Married  Catering manager Violence: Not on file      Review of systems: All other review of systems negative except as mentioned in the HPI.   Physical Exam: Vitals:   01/03/24 1412  BP: 116/68  Pulse: 82   Body mass index is 23.65 kg/m. Gen:      No acute distress HEENT:  sclera anicteric Neuro: alert and oriented x 3 Psych: normal mood and affect  Data Reviewed:  Reviewed labs, radiology imaging, old records and pertinent past GI work up     Assessment and Plan Assessment & Plan Bowel Habit Changes Improvement in bowel habits following physical therapy with Channing, completed in May or June. Practicing exercises at home, including Pelvic exercises, which have helped in forming stools and improving evacuation. - Continue exercises and monitor for changes in bowel habits.  Precancerous Colon Polyps Colonoscopy in 2023 revealed precancerous polyps, approximately 10 mm, which were removed to prevent progression to colon cancer. Current three-year interval for colonoscopy is based on conservative guidelines and is appropriate given the size and nature of the polyps. Removal of polyps prevents progression  to cancer. Colonoscopies are not complication-free; risks include anesthesia and procedural complications. - Schedule follow-up colonoscopy for April 2026. - Report any new symptoms such as unexplained changes in bowel habits, bleeding, or pain.  Fatigue Decreased energy levels over the past several weeks, possibly due to heat and overexertion.  - Monitor symptoms and report significant changes to primary care physician, Dr. Charlett.   Return as needed     The patient was provided an opportunity to ask questions and all were answered. The patient agreed with the plan and demonstrated an understanding of the instructions.  Heather Goodwin , MD    CC: Panosh, Wanda K,  MD

## 2024-01-10 ENCOUNTER — Encounter: Payer: Self-pay | Admitting: Gastroenterology

## 2024-01-10 NOTE — Patient Instructions (Signed)
 VISIT SUMMARY:  Today, we discussed your improved bowel habits following physical therapy, your colorectal cancer surveillance plan, and your recent decrease in energy levels.  YOUR PLAN:  BOWEL HABIT CHANGES: Your bowel habits have improved after completing physical therapy and continuing exercises at home. -Continue your physical therapy exercises at home, including the pelvic exercises. -Monitor for any changes in your bowel habits and report them if they occur.  PRECANCEROUS COLON POLYPS: You had precancerous polyps removed during your last colonoscopy in 2023. The next colonoscopy is due in April 2026. -Your next colonoscopy is due in April 2026. -Report any new symptoms such as unexplained changes in bowel habits, bleeding, or pain.  FATIGUE: You have experienced decreased energy levels over the past several weeks, possibly due to recent travel and heat. -Monitor your energy levels and report any significant changes to Dr. Charlett.

## 2024-02-15 DIAGNOSIS — Z800 Family history of malignant neoplasm of digestive organs: Secondary | ICD-10-CM | POA: Diagnosis not present

## 2024-02-15 DIAGNOSIS — Z7183 Encounter for nonprocreative genetic counseling: Secondary | ICD-10-CM | POA: Diagnosis not present

## 2024-02-15 DIAGNOSIS — Z803 Family history of malignant neoplasm of breast: Secondary | ICD-10-CM | POA: Diagnosis not present

## 2024-03-14 ENCOUNTER — Other Ambulatory Visit: Payer: Medicare HMO

## 2024-05-31 ENCOUNTER — Ambulatory Visit: Admitting: Internal Medicine

## 2024-05-31 VITALS — BP 96/66 | HR 82 | Temp 97.6°F | Ht 66.0 in | Wt 149.8 lb

## 2024-05-31 DIAGNOSIS — R5383 Other fatigue: Secondary | ICD-10-CM

## 2024-05-31 DIAGNOSIS — M810 Age-related osteoporosis without current pathological fracture: Secondary | ICD-10-CM | POA: Diagnosis not present

## 2024-05-31 DIAGNOSIS — R053 Chronic cough: Secondary | ICD-10-CM | POA: Diagnosis not present

## 2024-05-31 DIAGNOSIS — Z0000 Encounter for general adult medical examination without abnormal findings: Secondary | ICD-10-CM

## 2024-05-31 DIAGNOSIS — D568 Other thalassemias: Secondary | ICD-10-CM

## 2024-05-31 DIAGNOSIS — Z79899 Other long term (current) drug therapy: Secondary | ICD-10-CM

## 2024-05-31 DIAGNOSIS — G629 Polyneuropathy, unspecified: Secondary | ICD-10-CM

## 2024-05-31 LAB — LIPID PANEL
Cholesterol: 223 mg/dL — ABNORMAL HIGH (ref 0–200)
HDL: 71 mg/dL (ref >39.00)
LDL Cholesterol: 122 mg/dL — ABNORMAL HIGH (ref 0–99)
NonHDL: 152.19
Total CHOL/HDL Ratio: 3
Triglycerides: 150 mg/dL — ABNORMAL HIGH (ref 0.0–149.0)
VLDL: 30 mg/dL (ref 0.0–40.0)

## 2024-05-31 LAB — CBC WITH DIFFERENTIAL/PLATELET
Basophils Absolute: 0.1 K/uL (ref 0.0–0.1)
Basophils Relative: 0.7 % (ref 0.0–3.0)
Eosinophils Absolute: 0.2 K/uL (ref 0.0–0.7)
Eosinophils Relative: 2.2 % (ref 0.0–5.0)
HCT: 38.6 % (ref 36.0–46.0)
Hemoglobin: 12.6 g/dL (ref 12.0–15.0)
Lymphocytes Relative: 23.8 % (ref 12.0–46.0)
Lymphs Abs: 1.6 K/uL (ref 0.7–4.0)
MCHC: 32.7 g/dL (ref 30.0–36.0)
MCV: 61.4 fl — ABNORMAL LOW (ref 78.0–100.0)
Monocytes Absolute: 0.5 K/uL (ref 0.1–1.0)
Monocytes Relative: 7.7 % (ref 3.0–12.0)
Neutro Abs: 4.5 K/uL (ref 1.4–7.7)
Neutrophils Relative %: 65.6 % (ref 43.0–77.0)
Platelets: 218 K/uL (ref 150.0–400.0)
RBC: 6.28 Mil/uL — ABNORMAL HIGH (ref 3.87–5.11)
RDW: 15.4 % (ref 11.5–15.5)
WBC: 6.9 K/uL (ref 4.0–10.5)

## 2024-05-31 LAB — T4, FREE: Free T4: 0.77 ng/dL (ref 0.60–1.60)

## 2024-05-31 LAB — COMPREHENSIVE METABOLIC PANEL WITH GFR
ALT: 14 U/L (ref 0–35)
AST: 13 U/L (ref 0–37)
Albumin: 4.5 g/dL (ref 3.5–5.2)
Alkaline Phosphatase: 61 U/L (ref 39–117)
BUN: 29 mg/dL — ABNORMAL HIGH (ref 6–23)
CO2: 28 meq/L (ref 19–32)
Calcium: 9.9 mg/dL (ref 8.4–10.5)
Chloride: 102 meq/L (ref 96–112)
Creatinine, Ser: 0.71 mg/dL (ref 0.40–1.20)
GFR: 85.69 mL/min (ref >60.00)
Glucose, Bld: 101 mg/dL — ABNORMAL HIGH (ref 70–99)
Potassium: 3.9 meq/L (ref 3.5–5.1)
Sodium: 138 meq/L (ref 135–145)
Total Bilirubin: 0.4 mg/dL (ref 0.2–1.2)
Total Protein: 6.8 g/dL (ref 6.0–8.3)

## 2024-05-31 LAB — HEMOGLOBIN A1C: Hgb A1c MFr Bld: 5.5 % (ref 4.6–6.5)

## 2024-05-31 LAB — C-REACTIVE PROTEIN: CRP: <0.5 mg/dL (ref 0.5–20.0)

## 2024-05-31 LAB — VITAMIN B12: Vitamin B-12: 485 pg/mL (ref 211–911)

## 2024-05-31 LAB — TSH: TSH: 1.23 u[IU]/mL (ref 0.35–5.50)

## 2024-05-31 NOTE — Progress Notes (Signed)
 Chief Complaint  Patient presents with   Annual Exam    HPI: Patient  Heather Goodwin  73 y.o. comes in today for Preventive Health Care visit   Still has covid  cough residual  had chst ct this year and nl ex patulous  esophagus No major change in health Still co of dec energy  ? Subtle less exercise tolerance  no cp . Still does vigorous work teaching laboratory technician Had audiology check   using prn otc  assistance with help when needed   Neuropathy feet ankle area feeling not progressing and no balance changes   Health Maintenance  Topic Date Due   Medicare Annual Wellness (AWV)  Never done   Zoster Vaccines- Shingrix  (2 of 2) 04/06/2018   COVID-19 Vaccine (3 - Pfizer risk series) 06/16/2024 (Originally 10/24/2019)   Influenza Vaccine  09/19/2024 (Originally 01/21/2024)   DTaP/Tdap/Td (3 - Td or Tdap) 05/31/2025 (Originally 10/07/2023)   Pneumococcal Vaccine: 50+ Years (1 of 1 - PCV) 05/31/2025 (Originally 03/28/2003)   Colonoscopy  10/09/2024   Mammogram  09/15/2025   Bone Density Scan  Completed   Meningococcal B Vaccine  Aged Out   Hepatitis C Screening  Discontinued   Health Maintenance Review LIFESTYLE:  Exercise:   yes  a lot  Tobacco/ETS: n Alcohol:  ocass  Sugar beverages: Sleep:   interrupted some      Drug use: no HH of  2  Work:   still  working     ROS:  GEN/ HEENT: No fever, significant weight changes sweats headaches vision problems hearing changes, CV/ PULM; No chest pain  syncope,edema  change in exercise tolerance.? GI /GU: No adominal pain, vomiting, change in bowel habits. No blood in the stool. No significant GU symptoms. SKIN/HEME: ,no acute skin rashes suspicious lesions or bleeding. No lymphadenopathy, nodules, masses.  NEURO/ PSYCH:  No neurologic signs such as weakness. No depression anxiety. IMM/ Allergy: No unusual infections.  Allergy .   REST of 12 system review negative except as per HPI   Past Medical History:  Diagnosis Date   Beta  thalassemia trait    Cataract Early stage   Foot fracture    at gym   Osteoporosis    Peripheral neuropathy 04/01/2020    Past Surgical History:  Procedure Laterality Date   COLONOSCOPY  2006   normal -Dr.Brodie   COLONOSCOPY  2012   Dr.Brodie-hyperplastic polyps   FRACTURE SURGERY  Meniscus repair   left knee surgery  2009   meniscus    US  ECHOCARDIOGRAPHY  2005   normal for syncope     Family History  Problem Relation Age of Onset   Thyroid  disease Mother        partial thyroidectomy   Pancreatic cancer Mother 2   Cirrhosis Father        secondary to chronic hepatitis b infection   Other Son        B Thal trait   Breast cancer Paternal Aunt 84   Breast cancer Maternal Aunt 90   Pancreatic cancer Maternal Grandfather        dx 75-80   Melanoma Brother 56   Colon cancer Neg Hx    Esophageal cancer Neg Hx    Rectal cancer Neg Hx     Social History   Socioeconomic History   Marital status: Married    Spouse name: Not on file   Number of children: 3   Years of education: Not on file  Highest education level: Not on file  Occupational History   Occupation: Real Estate   Occupation: Self-employed  Tobacco Use   Smoking status: Never   Smokeless tobacco: Never  Vaping Use   Vaping status: Never Used  Substance and Sexual Activity   Alcohol use: Yes    Alcohol/week: 1.0 - 4.0 standard drink of alcohol    Types: 1 - 4 Glasses of wine per week    Comment: occ   Drug use: Never   Sexual activity: Not on file  Other Topics Concern   Not on file  Social History Narrative   Lives at home with husband   2-3 caffeine drinks daily   Patient is right handed.         Social Drivers of Health   Tobacco Use: Low Risk (01/10/2024)   Patient History    Smoking Tobacco Use: Never    Smokeless Tobacco Use: Never    Passive Exposure: Not on file  Financial Resource Strain: Patient Declined (05/28/2024)   Overall Financial Resource Strain (CARDIA)    Difficulty  of Paying Living Expenses: Patient declined  Food Insecurity: Patient Declined (05/28/2024)   Epic    Worried About Programme Researcher, Broadcasting/film/video in the Last Year: Patient declined    Barista in the Last Year: Patient declined  Transportation Needs: Patient Declined (05/28/2024)   Epic    Lack of Transportation (Medical): Patient declined    Lack of Transportation (Non-Medical): Patient declined  Physical Activity: Unknown (05/28/2024)   Exercise Vital Sign    Days of Exercise per Week: Patient declined    Minutes of Exercise per Session: Not on file  Stress: Patient Declined (05/28/2024)   [employer] of Occupational Health - Occupational Stress Questionnaire    Feeling of Stress: Patient declined  Social Connections: Unknown (05/28/2024)   Social Connection and Isolation Panel    Frequency of Communication with Friends and Family: Patient declined    Frequency of Social Gatherings with Friends and Family: Patient declined    Attends Religious Services: Patient declined    Active Member of Clubs or Organizations: Patient declined    Attends Banker Meetings: Not on file    Marital Status: Patient declined  Depression (PHQ2-9): Low Risk (04/01/2022)   Depression (PHQ2-9)    PHQ-2 Score: 3  Alcohol Screen: Low Risk (04/15/2023)   Alcohol Screen    Last Alcohol Screening Score (AUDIT): 2  Housing: Patient Declined (05/28/2024)   Epic    Unable to Pay for Housing in the Last Year: Patient declined    Number of Times Moved in the Last Year: Not on file    Homeless in the Last Year: Patient declined  Utilities: Patient Declined (02/12/2024)   Received from [facility]    In the past 12 months has the electric, gas, oil, or water company threatened to shut off services in your home?: Patient declined  Health Literacy: Not on file    Outpatient Medications Prior to Visit  Medication Sig Dispense Refill   Collagen-Vitamin C-Biotin (COLLAGEN PO) Take by mouth  daily.     NON FORMULARY Compound Rx contain amino acid for osteoporosis.     Wheat Dextrin (BENEFIBER DRINK MIX PO) Take by mouth in the morning and at bedtime. 3 teaspoons two times  daily     No facility-administered medications prior to visit.     EXAM:  BP 96/66 (BP Location: Left Arm, Patient Position: Sitting, Cuff  Size: Normal)   Pulse 82   Temp 97.6 F (36.4 C) (Oral)   Ht 5' 6 (1.676 m)   Wt 149 lb 12.8 oz (67.9 kg)   SpO2 98%   BMI 24.18 kg/m   Body mass index is 24.18 kg/m. Wt Readings from Last 3 Encounters:  05/31/24 149 lb 12.8 oz (67.9 kg)  01/03/24 151 lb (68.5 kg)  06/24/23 152 lb 3.2 oz (69 kg)    Physical Exam: Vital signs reviewed HZW:Uypd is a well-developed well-nourished alert cooperative    who appearsr stated age in no acute distress.  HEENT: normocephalic atraumatic , Eyes: PERRL EOM's full, conjunctiva clear, Nares: paten,t no deformity discharge or tenderness., Ears: no deformity EAC's clear TMs with normal landmarks. Mouth: clear OP, no lesions, edema.  Moist mucous membranes. Dentition in adequate repair. NECK: supple without masses, thyromegaly or bruits. CHEST/PULM:  Clear to auscultation and percussion breath sounds equal no wheeze , rales or rhonchi. No chest wall deformities or tenderness. Breast: normal by inspection . No dimpling, discharge, masses, tenderness or discharge . CV: PMI is nondisplaced, S1 S2 no gallops, murmurs, rubs. Peripheral pulses are full without delay.No JVD .  ABDOMEN: Bowel sounds normal nontender  No guard or rebound, no hepato splenomegal no CVA tenderness.   Extremtities:  No clubbing cyanosis or edema, no acute joint swelling or redness no focal atrophy NEURO:  Oriented x3, cranial nerves 3-12 appear to be intact, no obvious focal weakness,gait within normal limits no abnormal reflexes or asymmetrical SKIN: No acute rashes normal turgor, color, no bruising or petechiae. PSYCH: Oriented, good eye contact, no  obvious depression anxiety, cognition and judgment appear normal. LN: no cervical axillary  adenopathy  Lab Results  Component Value Date   WBC 6.9 05/31/2024   HGB 12.6 05/31/2024   HCT 38.6 05/31/2024   PLT 218.0 05/31/2024   GLUCOSE 101 (H) 05/31/2024   CHOL 223 (H) 05/31/2024   TRIG 150.0 (H) 05/31/2024   HDL 71.00 05/31/2024   LDLDIRECT 123.1 03/02/2011   LDLCALC 122 (H) 05/31/2024   ALT 14 05/31/2024   AST 13 05/31/2024   NA 138 05/31/2024   K 3.9 05/31/2024   CL 102 05/31/2024   CREATININE 0.71 05/31/2024   BUN 29 (H) 05/31/2024   CO2 28 05/31/2024   TSH 1.23 05/31/2024   HGBA1C 5.5 05/31/2024    BP Readings from Last 3 Encounters:  05/31/24 96/66  01/03/24 116/68  09/28/23 112/60    Lab plan  reviewed with patient   ASSESSMENT AND PLAN:  Discussed the following assessment and plan:    ICD-10-CM   1. Visit for preventive health examination  Z00.00     2. Cough, persistent  R05.3 CBC with Differential/Platelet    Comprehensive metabolic panel with GFR    Lipid panel    TSH    Hemoglobin A1c    Vitamin B12    T4, Free    C-reactive protein    ANA    3. Decreased energy  R53.83 CBC with Differential/Platelet    Comprehensive metabolic panel with GFR    Lipid panel    TSH    Hemoglobin A1c    Vitamin B12    T4, Free    C-reactive protein    ANA    4. Neuropathy  G62.9 CBC with Differential/Platelet    Comprehensive metabolic panel with GFR    Lipid panel    TSH    Hemoglobin A1c    Vitamin B12  T4, Free    C-reactive protein    ANA    5. Medication management  Z79.899 CBC with Differential/Platelet    Comprehensive metabolic panel with GFR    Lipid panel    TSH    Hemoglobin A1c    Vitamin B12    T4, Free    C-reactive protein    ANA    6. Osteoporosis without current pathological fracture, unspecified osteoporosis type  M81.0 CBC with Differential/Platelet    Comprehensive metabolic panel with GFR    Lipid panel    TSH     Hemoglobin A1c    Vitamin B12    C-reactive protein    ANA    7. Other thalassemia  D56.8 CBC with Differential/Platelet    Comprehensive metabolic panel with GFR    Lipid panel    TSH    Hemoglobin A1c    Vitamin B12    T4, Free    C-reactive protein    ANA    Healthy but  fatigue energy c/o ns    Mild sx  occurred after covid vaccine but may have been  coincidental . Disc  options for  more eval    can try 6 weeks of otc ppi  and see if  improves  as if ELR Disc seeing pulmonary for  further eval more for dx   and  to be sure no other causes .  This seems to be post covid  Supplementing  hearing as indicated  needed  Return in about 1 year (around 05/31/2025) for depending on results.  Patient Care Team: Magdalen Cabana, Apolinar POUR, MD as PCP - General Barbette Knock, MD (Obstetrics and Gynecology) Kit Rush, MD as Consulting Physician (Orthopedic Surgery) Nandigam, Kavitha V, MD as Consulting Physician (Gastroenterology) Patient Instructions  Shingrix  at pharmacy Tdap if covered at pharmacy  Can try  otc Prilosec( omeprazole) daily for 6-8 weeks to see if effects cough ( if silent reflux)  Let us  know if want a pulmonary consult as discussed .   Neuropathy seems stable  let us  know if want an update eval  labs today   Vernecia Umble K. Amberle Lyter M.D.

## 2024-05-31 NOTE — Patient Instructions (Signed)
 Shingrix  at pharmacy Tdap if covered at pharmacy  Can try  otc Prilosec( omeprazole) daily for 6-8 weeks to see if effects cough ( if silent reflux)  Let us  know if want a pulmonary consult as discussed .   Neuropathy seems stable  let us  know if want an update eval  labs today

## 2024-06-03 LAB — ANTI-NUCLEAR AB-TITER (ANA TITER): ANA Titer 1: 1:40 {titer} — ABNORMAL HIGH

## 2024-06-03 LAB — ANA: Anti Nuclear Antibody (ANA): POSITIVE — AB

## 2024-06-04 ENCOUNTER — Ambulatory Visit: Payer: Self-pay | Admitting: Internal Medicine

## 2024-06-04 NOTE — Progress Notes (Signed)
 Thyroid  kidney liver ok and no anemia   cholesterol improved  Ana is borderline positive and may not be clinically significant . Inflammation markers are  normal and not elevated . B12 nl  glucose normal for non fasting No other explanation for  low energy

## 2024-06-07 ENCOUNTER — Encounter: Payer: Self-pay | Admitting: Internal Medicine

## 2024-06-09 ENCOUNTER — Telehealth: Payer: Self-pay

## 2024-06-09 ENCOUNTER — Other Ambulatory Visit: Payer: Self-pay | Admitting: Internal Medicine

## 2024-06-09 ENCOUNTER — Telehealth: Payer: Self-pay | Admitting: Internal Medicine

## 2024-06-09 DIAGNOSIS — M810 Age-related osteoporosis without current pathological fracture: Secondary | ICD-10-CM

## 2024-06-09 NOTE — Telephone Encounter (Signed)
 Patient called stating that she needs a Bone Density Order placed.

## 2024-06-09 NOTE — Telephone Encounter (Signed)
 J, I put the order in.  Please have her call [facility] (we cannot do it at the [facility]) to have this scheduled.  It is too soon after the previous and she will likely need to pay out-of-pocket but that is ok, if that is her preference.

## 2024-06-09 NOTE — Telephone Encounter (Signed)
 Patient called and scheduled her one year follow up and she states that she needs orders put in so that she can get her bone density scan.

## 2024-06-09 NOTE — Telephone Encounter (Addendum)
 I called and spoke with the patient and she is very adimate about getting this Bone Density done. I advised to her multiple times that she is not due until 2027 but she wants to still have it done.

## 2024-06-30 NOTE — Telephone Encounter (Signed)
 Patient called to check up on Bone Density has not been able to schedule and has been unsuccessful finding out who to contact.  Please call her back [phone].

## 2024-07-03 ENCOUNTER — Encounter: Payer: Self-pay | Admitting: Internal Medicine

## 2024-07-03 DIAGNOSIS — R799 Abnormal finding of blood chemistry, unspecified: Secondary | ICD-10-CM

## 2024-07-03 DIAGNOSIS — G609 Hereditary and idiopathic neuropathy, unspecified: Secondary | ICD-10-CM

## 2024-07-03 DIAGNOSIS — Z9189 Other specified personal risk factors, not elsewhere classified: Secondary | ICD-10-CM

## 2024-07-03 DIAGNOSIS — R202 Paresthesia of skin: Secondary | ICD-10-CM

## 2024-07-03 DIAGNOSIS — E7800 Pure hypercholesterolemia, unspecified: Secondary | ICD-10-CM

## 2024-07-05 NOTE — Telephone Encounter (Signed)
 Left a detail message stating Dr. Charlett left her lab result on mychart once she is back from her trip to call us  or send mychart message if have any question.

## 2024-07-24 NOTE — Telephone Encounter (Signed)
 Phone conversation going over labs   Lipids has optimized  life style   disc cac score  her risk is 7.7% by ascvd   Disc option of cv eval energy change  she can still walk go 6 miles but with less energy. No real doe or change in tolerance .  Under GI program surveillance cause of fam hx of pancreatic cancer . To get endo and colon soon.  Neuropathy seen dr Jenel in past still present  not sure if worse ?s will send in new referral since has been  just over 3 years feels has bilateral sx . And other sx fatigue and low ana.  Lab  low positive ana    no other sx x neuropathy and  fatigue. Consider seeing  rheum if any progression or typical sx . CRp was not elevated .   Elevated bun x 1 with nl cr gfr :  hydrate and bmp at her convenience  at [facility] lab . Add lipo A screen

## 2024-07-27 ENCOUNTER — Telehealth: Payer: Self-pay | Admitting: Gastroenterology

## 2024-07-27 NOTE — Telephone Encounter (Signed)
 Left patient a voicemail to call back

## 2024-07-27 NOTE — Telephone Encounter (Signed)
 Please schedule next available office visit with me to discuss. Thanks

## 2024-07-27 NOTE — Telephone Encounter (Signed)
 Good Morning Dr. Shila,     Patient called stating she recently saw [facility] for a pancreatic study. She stated they suggested for her to be scheduled for a colonoscopy & Endoscopy. I advised patient she would schedule with [facility] since she was recently seen. Patient stated she was seen for a different medical concern & would like to schedule procedures with you. Please Advise further on scheduling.    Thank you.

## 2024-08-04 ENCOUNTER — Other Ambulatory Visit (HOSPITAL_BASED_OUTPATIENT_CLINIC_OR_DEPARTMENT_OTHER)

## 2024-08-07 ENCOUNTER — Ambulatory Visit: Admitting: Gastroenterology

## 2024-09-20 ENCOUNTER — Other Ambulatory Visit

## 2024-09-28 ENCOUNTER — Ambulatory Visit: Admitting: Internal Medicine
